# Patient Record
Sex: Female | Born: 1940 | Race: White | Hispanic: No | Smoking: Never smoker
Health system: Southern US, Community
[De-identification: ages and names within clinical notes are randomized; demographics above are authoritative.]

## PROBLEM LIST (undated history)

## (undated) DIAGNOSIS — R011 Cardiac murmur, unspecified: Secondary | ICD-10-CM

## (undated) DIAGNOSIS — J189 Pneumonia, unspecified organism: Secondary | ICD-10-CM

## (undated) DIAGNOSIS — Z803 Family history of malignant neoplasm of breast: Secondary | ICD-10-CM

## (undated) DIAGNOSIS — H269 Unspecified cataract: Secondary | ICD-10-CM

## (undated) DIAGNOSIS — R0600 Dyspnea, unspecified: Secondary | ICD-10-CM

## (undated) DIAGNOSIS — I10 Essential (primary) hypertension: Secondary | ICD-10-CM

## (undated) DIAGNOSIS — C801 Malignant (primary) neoplasm, unspecified: Secondary | ICD-10-CM

## (undated) DIAGNOSIS — Z800 Family history of malignant neoplasm of digestive organs: Secondary | ICD-10-CM

## (undated) DIAGNOSIS — M810 Age-related osteoporosis without current pathological fracture: Secondary | ICD-10-CM

## (undated) DIAGNOSIS — M5481 Occipital neuralgia: Secondary | ICD-10-CM

## (undated) DIAGNOSIS — Z8489 Family history of other specified conditions: Secondary | ICD-10-CM

## (undated) DIAGNOSIS — D649 Anemia, unspecified: Secondary | ICD-10-CM

## (undated) DIAGNOSIS — Z8042 Family history of malignant neoplasm of prostate: Secondary | ICD-10-CM

## (undated) DIAGNOSIS — M1990 Unspecified osteoarthritis, unspecified site: Secondary | ICD-10-CM

## (undated) DIAGNOSIS — Z860100 Personal history of colon polyps, unspecified: Secondary | ICD-10-CM

## (undated) HISTORY — PX: BREAST LUMPECTOMY: SHX2

## (undated) HISTORY — DX: Personal history of colonic polyps: Z86.010

## (undated) HISTORY — DX: Anemia, unspecified: D64.9

## (undated) HISTORY — PX: FRACTURE SURGERY: SHX138

## (undated) HISTORY — PX: EYE SURGERY: SHX253

## (undated) HISTORY — DX: Occipital neuralgia: M54.81

## (undated) HISTORY — DX: Cardiac murmur, unspecified: R01.1

## (undated) HISTORY — DX: Unspecified cataract: H26.9

## (undated) HISTORY — PX: WISDOM TOOTH EXTRACTION: SHX21

## (undated) HISTORY — PX: FOOT SURGERY: SHX648

## (undated) HISTORY — PX: TUBAL LIGATION: SHX77

## (undated) HISTORY — DX: Family history of malignant neoplasm of breast: Z80.3

## (undated) HISTORY — DX: Personal history of colon polyps, unspecified: Z86.0100

## (undated) HISTORY — DX: Family history of malignant neoplasm of prostate: Z80.42

## (undated) HISTORY — PX: COLON SURGERY: SHX602

## (undated) HISTORY — DX: Malignant (primary) neoplasm, unspecified: C80.1

## (undated) HISTORY — DX: Unspecified osteoarthritis, unspecified site: M19.90

## (undated) HISTORY — DX: Family history of malignant neoplasm of digestive organs: Z80.0

---

## 1999-04-07 ENCOUNTER — Ambulatory Visit (HOSPITAL_COMMUNITY): Admission: RE | Admit: 1999-04-07 | Discharge: 1999-04-07 | Payer: Self-pay | Admitting: Oral & Maxillofacial Surgery

## 1999-04-07 ENCOUNTER — Encounter: Payer: Self-pay | Admitting: Oral & Maxillofacial Surgery

## 1999-07-05 ENCOUNTER — Ambulatory Visit (HOSPITAL_COMMUNITY): Admission: RE | Admit: 1999-07-05 | Discharge: 1999-07-05 | Payer: Self-pay | Admitting: Gastroenterology

## 1999-07-05 ENCOUNTER — Encounter: Payer: Self-pay | Admitting: Gastroenterology

## 1999-12-09 ENCOUNTER — Encounter: Admission: RE | Admit: 1999-12-09 | Discharge: 1999-12-09 | Payer: Self-pay | Admitting: Family Medicine

## 1999-12-09 ENCOUNTER — Encounter: Payer: Self-pay | Admitting: Family Medicine

## 2000-02-16 ENCOUNTER — Other Ambulatory Visit: Admission: RE | Admit: 2000-02-16 | Discharge: 2000-02-16 | Payer: Self-pay | Admitting: Family Medicine

## 2000-03-13 ENCOUNTER — Ambulatory Visit (HOSPITAL_COMMUNITY): Admission: RE | Admit: 2000-03-13 | Discharge: 2000-03-13 | Payer: Self-pay | Admitting: Gastroenterology

## 2000-03-13 ENCOUNTER — Encounter (INDEPENDENT_AMBULATORY_CARE_PROVIDER_SITE_OTHER): Payer: Self-pay | Admitting: Specialist

## 2001-02-18 ENCOUNTER — Other Ambulatory Visit: Admission: RE | Admit: 2001-02-18 | Discharge: 2001-02-18 | Payer: Self-pay | Admitting: Family Medicine

## 2002-02-03 ENCOUNTER — Ambulatory Visit (HOSPITAL_COMMUNITY): Admission: RE | Admit: 2002-02-03 | Discharge: 2002-02-03 | Payer: Self-pay | Admitting: Gastroenterology

## 2002-02-03 ENCOUNTER — Encounter: Payer: Self-pay | Admitting: Gastroenterology

## 2002-02-19 ENCOUNTER — Other Ambulatory Visit: Admission: RE | Admit: 2002-02-19 | Discharge: 2002-02-19 | Payer: Self-pay | Admitting: Family Medicine

## 2003-04-20 ENCOUNTER — Other Ambulatory Visit: Admission: RE | Admit: 2003-04-20 | Discharge: 2003-04-20 | Payer: Self-pay | Admitting: Family Medicine

## 2003-05-21 ENCOUNTER — Encounter: Payer: Self-pay | Admitting: Family Medicine

## 2003-05-21 ENCOUNTER — Encounter: Admission: RE | Admit: 2003-05-21 | Discharge: 2003-05-21 | Payer: Self-pay | Admitting: Family Medicine

## 2004-06-08 ENCOUNTER — Other Ambulatory Visit: Admission: RE | Admit: 2004-06-08 | Discharge: 2004-06-08 | Payer: Self-pay | Admitting: Family Medicine

## 2004-08-16 ENCOUNTER — Encounter (HOSPITAL_COMMUNITY): Admission: RE | Admit: 2004-08-16 | Discharge: 2004-11-14 | Payer: Self-pay | Admitting: General Surgery

## 2004-08-18 ENCOUNTER — Ambulatory Visit (HOSPITAL_BASED_OUTPATIENT_CLINIC_OR_DEPARTMENT_OTHER): Admission: RE | Admit: 2004-08-18 | Discharge: 2004-08-18 | Payer: Self-pay | Admitting: General Surgery

## 2004-08-18 ENCOUNTER — Encounter (INDEPENDENT_AMBULATORY_CARE_PROVIDER_SITE_OTHER): Payer: Self-pay | Admitting: Specialist

## 2004-08-18 ENCOUNTER — Encounter: Admission: RE | Admit: 2004-08-18 | Discharge: 2004-08-18 | Payer: Self-pay | Admitting: General Surgery

## 2004-08-18 ENCOUNTER — Ambulatory Visit (HOSPITAL_COMMUNITY): Admission: RE | Admit: 2004-08-18 | Discharge: 2004-08-18 | Payer: Self-pay | Admitting: General Surgery

## 2004-08-30 ENCOUNTER — Ambulatory Visit: Payer: Self-pay | Admitting: Oncology

## 2004-09-06 ENCOUNTER — Ambulatory Visit: Admission: RE | Admit: 2004-09-06 | Discharge: 2004-09-08 | Payer: Self-pay | Admitting: Radiation Oncology

## 2005-02-07 ENCOUNTER — Ambulatory Visit: Payer: Self-pay | Admitting: Oncology

## 2005-06-20 ENCOUNTER — Other Ambulatory Visit: Admission: RE | Admit: 2005-06-20 | Discharge: 2005-06-20 | Payer: Self-pay | Admitting: Family Medicine

## 2005-08-09 ENCOUNTER — Ambulatory Visit (HOSPITAL_COMMUNITY): Admission: RE | Admit: 2005-08-09 | Discharge: 2005-08-09 | Payer: Self-pay | Admitting: Gastroenterology

## 2005-08-09 ENCOUNTER — Encounter (INDEPENDENT_AMBULATORY_CARE_PROVIDER_SITE_OTHER): Payer: Self-pay | Admitting: Specialist

## 2005-10-12 ENCOUNTER — Ambulatory Visit (HOSPITAL_BASED_OUTPATIENT_CLINIC_OR_DEPARTMENT_OTHER): Admission: RE | Admit: 2005-10-12 | Discharge: 2005-10-12 | Payer: Self-pay | Admitting: Orthopedic Surgery

## 2006-02-02 ENCOUNTER — Ambulatory Visit: Payer: Self-pay | Admitting: Oncology

## 2007-02-01 ENCOUNTER — Ambulatory Visit: Payer: Self-pay | Admitting: Oncology

## 2008-02-07 ENCOUNTER — Ambulatory Visit: Payer: Self-pay | Admitting: Oncology

## 2008-02-11 LAB — COMPREHENSIVE METABOLIC PANEL
ALT: 19 U/L (ref 0–35)
AST: 22 U/L (ref 0–37)
Alkaline Phosphatase: 71 U/L (ref 39–117)
Creatinine, Ser: 0.72 mg/dL (ref 0.40–1.20)
Sodium: 139 mEq/L (ref 135–145)
Total Bilirubin: 0.3 mg/dL (ref 0.3–1.2)
Total Protein: 6.5 g/dL (ref 6.0–8.3)

## 2008-02-11 LAB — CBC WITH DIFFERENTIAL/PLATELET
BASO%: 0.8 % (ref 0.0–2.0)
EOS%: 2.3 % (ref 0.0–7.0)
HCT: 40.9 % (ref 34.8–46.6)
LYMPH%: 33.3 % (ref 14.0–48.0)
MCH: 31.3 pg (ref 26.0–34.0)
MCHC: 35 g/dL (ref 32.0–36.0)
MONO#: 0.4 10*3/uL (ref 0.1–0.9)
NEUT%: 55.6 % (ref 39.6–76.8)
Platelets: ADEQUATE 10*3/uL (ref 145–400)
RBC: 4.56 10*6/uL (ref 3.70–5.32)
WBC: 5.1 10*3/uL (ref 3.9–10.0)

## 2008-10-31 ENCOUNTER — Inpatient Hospital Stay (HOSPITAL_COMMUNITY): Admission: AD | Admit: 2008-10-31 | Discharge: 2008-10-31 | Payer: Self-pay | Admitting: Family Medicine

## 2009-02-08 ENCOUNTER — Ambulatory Visit: Payer: Self-pay | Admitting: Oncology

## 2009-02-10 LAB — CBC WITH DIFFERENTIAL/PLATELET
BASO%: 0.3 % (ref 0.0–2.0)
Eosinophils Absolute: 0.1 10*3/uL (ref 0.0–0.5)
LYMPH%: 29.3 % (ref 14.0–49.7)
MCHC: 35 g/dL (ref 31.5–36.0)
MONO#: 0.4 10*3/uL (ref 0.1–0.9)
NEUT#: 2.7 10*3/uL (ref 1.5–6.5)
RBC: 4.05 10*6/uL (ref 3.70–5.45)
RDW: 12.5 % (ref 11.2–14.5)
WBC: 4.4 10*3/uL (ref 3.9–10.3)

## 2009-02-10 LAB — COMPREHENSIVE METABOLIC PANEL
ALT: 18 U/L (ref 0–35)
Albumin: 4.6 g/dL (ref 3.5–5.2)
Alkaline Phosphatase: 85 U/L (ref 39–117)
CO2: 24 mEq/L (ref 19–32)
Glucose, Bld: 118 mg/dL — ABNORMAL HIGH (ref 70–99)
Potassium: 4.1 mEq/L (ref 3.5–5.3)
Sodium: 139 mEq/L (ref 135–145)
Total Bilirubin: 0.3 mg/dL (ref 0.3–1.2)
Total Protein: 6.9 g/dL (ref 6.0–8.3)

## 2009-02-10 LAB — CANCER ANTIGEN 27.29: CA 27.29: 32 U/mL (ref 0–39)

## 2010-10-25 DIAGNOSIS — M159 Polyosteoarthritis, unspecified: Secondary | ICD-10-CM | POA: Insufficient documentation

## 2010-10-25 DIAGNOSIS — M810 Age-related osteoporosis without current pathological fracture: Secondary | ICD-10-CM | POA: Insufficient documentation

## 2011-01-16 LAB — URINE MICROSCOPIC-ADD ON

## 2011-01-16 LAB — URINALYSIS, ROUTINE W REFLEX MICROSCOPIC
Glucose, UA: NEGATIVE mg/dL
Protein, ur: >300 mg/dL — AB
Specific Gravity, Urine: >1.03 — ABNORMAL HIGH (ref 1.005–1.030)
pH: 5.5 (ref 5.0–8.0)

## 2011-01-20 DIAGNOSIS — I10 Essential (primary) hypertension: Secondary | ICD-10-CM | POA: Insufficient documentation

## 2011-02-17 NOTE — Procedures (Signed)
[facility]  Patient:    TACHA, MANNI                    MRN: [id number] Proc. Date: 03/13/00 Adm. Date:  [id number] Disc. Date: [id number] Attending:  Nelda Marseille CC:         Petra Kuba, M.D.             Talmadge Coventry, M.D.                           Procedure Report  PROCEDURE:  Colonoscopy.  ENDOSCOPIST:  Petra Kuba, M.D.  INDICATION:  Abnormal BE.  Consent was signed after risks, benefits, methods and options were thoroughly discussed multiple times in the past.  MEDICINES USED:  Demerol 80 mg, Versed 8 mg.  DESCRIPTION OF PROCEDURE:  Rectal inspection was pertinent for external hemorrhoids.  Digital exam was negative.  The pediatric videocolonoscope was inserted, easily advanced to about 50 cm.  Unfortunately, at that junction, scope began to loop and despite multiple position changes and abdominal pressures, we were unable to keep from looping and we elected to slowly withdraw.  No abnormalities were seen on withdrawal.  Once back in the rectum, the scope was retroflexed.  We then went ahead and inserted the videocolonoscope, which we were easily able to advance around the colon to the cecum.  This did require some left lower quadrant pressure.  There was some minimal looping but this did not require position changes.  On insertion, no abnormalities were seen.  The cecum was identified by the appendiceal orifice and the ileocecal valve; in fact, the scope was inserted a short ways into the terminal ileum which was normal.  Photo-documentation was obtained.  The scope was slowly withdrawn.  On slow withdrawal through the colon, a questionable 2- or 3-mm polyp was seen in the approximately level of the transverse.  Due to significant looping, it was sometimes hard to say what location you were at, transverse or be it descending.  When we fell back around a loop or a curve, we did try to readvance around that curve.  A few  cold biopsies if this questionable polyp were seen.  Once back in the rectum, the scope was retroflexed.  We went ahead and readvanced to the cecum with her on her left side and then rolled her on her right side and on slow withdrawal with her now on the right side, another 1- to 2-mm polyp was seen in the possible transverse, which was cold-biopsied as well, but again, no obvious significant polyp was seen as we slowly withdrew back to the rectum with her on her right side.  Patient was then returned to her left side, scope advanced to 30 cm, air was suctioned, scope removed.  Patient tolerated the procedure adequately. There was no obvious immediate complication.  ENDOSCOPIC DIAGNOSES: 1. Internal and external hemorrhoids. 2. Tortuous colon. 3. Two questionable polyps in the transverse, both small, status post cold    biopsy. 4. Otherwise within normal limits to the terminal ileum, with a good look on    both the left and the right side.  PLAN:  We will see her back in six weeks to recheck guaiac, symptoms, CBC and decide if an upper GI small-bowel follow-through is needed.  We will await her pathology to help determine future colonic screening but will probably need a barium enema at some point in  the future with a Fleet prep to help confirm that that polyp is no longer there and was not missed due to her tortuous colon. DD:  03/13/00 TD:  03/16/00 Job: [id number]

## 2011-02-17 NOTE — Op Note (Signed)
NAME:  Gloria Cummings, Gloria Cummings             ACCOUNT NO.:  [id number]   MEDICAL RECORD NO.:  [id number]          PATIENT TYPE:  AMB   LOCATION:  DSC                          FACILITY:  [facility]   PHYSICIAN:  Katy Fitch. Sypher, M.D. DATE OF BIRTH:  December 04, 1940   DATE OF PROCEDURE:  10/12/2005  DATE OF DISCHARGE:                                 OPERATIVE REPORT   PREOP DIAGNOSIS:  1.  Chronic stenosing tenosynovitis, right first dorsal compartment.  2.  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   POSTOP DIAGNOSIS:  1.  Chronic stenosing tenosynovitis, right first dorsal compartment.  2.  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   OPERATION:  1.  Release of right first dorsal compartment with resection of septum      between extensor pollicis brevis and abductor pollicis longus tendons.  2.  Release of left long finger A1 pulley.   OPERATING SURGEON:  Molly Maduro Sypher   ASSISTANT:  Molly Maduro Dasnoit PA-C.   ANESTHESIA:  0.25% Marcaine and 2% lidocaine metacarpal head level block,  left long finger and field block of the right first loss compartment region.   SUPERVISING ANESTHESIOLOGIST:  Dr. Gypsy Balsam   INDICATIONS:  Gloria Cummings is a 70 year old woman referred by Dr.  Harvie Heck for evaluation and management of bilateral hand pain  predicaments. Clinical examination revealed signs of chronic right first  dorsal compartment stenosing tenosynovitis and examination of the left hand  revealed chronic triggering of the left long finger.   Due to failure to respond to nonoperative measures, she is brought to the  operating room at this time for release of her right first dorsal  compartment and release of her left long finger A1 pulley.   PROCEDURE:  Gloria Cummings was brought to the operating room and placed in  supine position on the table.   Following light sedation, the right and left arms were prepped with  Hibiclens due to a  Betadine intolerance.   A pneumatic tourniquet was applied to the proximal right brachium. IV access  was obtained at the left antecubital fossa.   The right and left hands were sterilely draped. The right arm exsanguinated  with Esmarch bandage and the arterial tourniquet on the proximal right  brachium inflated to 250 mmHg due to mild systolic hypertension.   Procedure commenced with a short transverse incision directly over the  palpably thickened first dorsal compartment.   The subcutaneous tissues were carefully divided taking care to identify the  multiple radial sensory branches and gently elevate them off of the swollen  compartment.   The compartment was very thickened, opaque and had a small ganglion cyst  developing at its apex.   The compartment incised and found to wall diameter of more than 4 mm. Two  slips of the abductor pollicis longus were noted. The septum separated the  extensor pollicis brevis. This was resected.   After release the compartment, free range of motion of the thumb CMC and MP  joints was recovered as well as free wrist motion.   The wound was repaired  with intradermal 3-0 Prolene suture and a Steri-  Strip. A compressive dressing was applied with sterile gauze and an Ace  wrap. The tourniquet was released with immediate capillary refill to the  thumb and fingers. Attention then directed to the left hand. The hand and  distal forearm were exsanguinated with an Esmarch bandage which was used on  the mid forearm as tourniquet. The swollen A1 pulley of the long finger was  easily identified. A short transverse incision was fashioned directly over  the proximal margin of the A1 pulley. The subcutaneous tissues were  carefully taking care to identify and gently retract the neurovascular  bundles. The pulley was split with scalpel and scissors along its radial  border. The flexor tendons were delivered found be swollen distal to the  pulley but  otherwise normal in appearance. Thereafter free range of motion  of the long finger was recovered.   Gloria Cummings tolerated surgery and anesthesia well. She was transferred to  the recovery room with stable vital signs.      Katy Fitch Sypher, M.D.  Electronically Signed     RVS/MEDQ  D:  10/12/2005  T:  10/13/2005  Job:  [id number]   cc:   Gloria Cummings, M.D.  Fax: [phone]

## 2011-11-11 ENCOUNTER — Ambulatory Visit (INDEPENDENT_AMBULATORY_CARE_PROVIDER_SITE_OTHER): Payer: Medicare Other | Admitting: Internal Medicine

## 2011-11-11 ENCOUNTER — Ambulatory Visit: Payer: Medicare Other

## 2011-11-11 VITALS — BP 123/72 | HR 80 | Temp 98.1°F | Resp 16 | Ht 65.0 in | Wt 134.2 lb

## 2011-11-11 DIAGNOSIS — J189 Pneumonia, unspecified organism: Secondary | ICD-10-CM

## 2011-11-11 DIAGNOSIS — I10 Essential (primary) hypertension: Secondary | ICD-10-CM

## 2011-11-11 DIAGNOSIS — J159 Unspecified bacterial pneumonia: Secondary | ICD-10-CM

## 2011-11-11 MED ORDER — AZITHROMYCIN 500 MG PO TABS: 500.0000 mg | ORAL_TABLET | Freq: Every day | ORAL | Status: AC

## 2011-11-11 MED ORDER — HYDROCOD POLST-CHLORPHEN POLST 10-8 MG/5ML PO LQCR: 5.0000 mL | Freq: Two times a day (BID) | ORAL | Status: DC | PRN

## 2011-11-11 NOTE — Progress Notes (Signed)
  Subjective:    Patient ID: Gloria Cummings, female    DOB: 07-13-1941, 71 y.o.   MRN: [id number]  HPI  This 71 year old female has a four-day history of a productive cough with marked fatigue. Cough is productive. She hears rattling at night and feels short of breath. There is no history of prior pulmonary problems except for a pneumonia in [address]. She is healthy except for hypertension is on Micardis. 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 She is active in contradance This illness has made her cut back on her activities greatly. The cough is so severe that she can't sleep at night.   Review of Systemsreview of systems is otherwise not helpful.she describes some otalgia on the right. There is a mild sore throat. There is no nausea and vomiting. There is no fever or night sweats.     Objective:   Physical ExamHEENT-conjunctiva are clear, tympanic membranes are intact and canals are clear, the throat is clear and there is no associated anterior cervical lymphadenopathy. The lungs reveal rales in the right lower lobe with rhonchi posteriorly. These do not clear with cough. Breath sounds are other side otherwise full throughout. Heart the heart is regular without murmurs rubs or gallops.  [facility] reading (PRIMARY) by  Dr.Lakeyta Vandenheuvel= There increased markings in the right lower lobe with some attenuation of the diaphragm border..     Assessment & Plan:  Problem #1 community-acquired pneumonia X-ray will be reviewed by radiology Treatment withZithromax 500 daily for 5 days and Tussionex suspension Followup quickly if not responding to treatment

## 2013-01-14 DIAGNOSIS — N390 Urinary tract infection, site not specified: Secondary | ICD-10-CM | POA: Insufficient documentation

## 2013-01-14 DIAGNOSIS — K635 Polyp of colon: Secondary | ICD-10-CM | POA: Insufficient documentation

## 2013-01-14 DIAGNOSIS — B0089 Other herpesviral infection: Secondary | ICD-10-CM | POA: Insufficient documentation

## 2015-02-23 ENCOUNTER — Other Ambulatory Visit (HOSPITAL_COMMUNITY): Payer: Self-pay | Admitting: Family Medicine

## 2015-02-23 ENCOUNTER — Telehealth (HOSPITAL_COMMUNITY): Payer: Self-pay | Admitting: *Deleted

## 2015-02-23 DIAGNOSIS — R011 Cardiac murmur, unspecified: Secondary | ICD-10-CM

## 2015-02-25 ENCOUNTER — Ambulatory Visit (HOSPITAL_COMMUNITY)
Admission: RE | Admit: 2015-02-25 | Discharge: 2015-02-25 | Disposition: A | Discharge status: Home / Self Care | Payer: Medicare Other | Source: Ambulatory Visit | Attending: Cardiology | Admitting: Cardiology

## 2015-02-25 DIAGNOSIS — R011 Cardiac murmur, unspecified: Secondary | ICD-10-CM | POA: Diagnosis not present

## 2015-02-25 DIAGNOSIS — I700 Atherosclerosis of aorta: Secondary | ICD-10-CM | POA: Insufficient documentation

## 2015-05-12 ENCOUNTER — Other Ambulatory Visit: Payer: Self-pay | Admitting: Gastroenterology

## 2015-08-26 ENCOUNTER — Emergency Department (HOSPITAL_COMMUNITY)
Admission: EM | Admit: 2015-08-26 | Discharge: 2015-08-26 | Disposition: A | Discharge status: Home / Self Care | Payer: Medicare Other | Attending: Emergency Medicine | Admitting: Emergency Medicine

## 2015-08-26 ENCOUNTER — Emergency Department (HOSPITAL_COMMUNITY): Payer: Medicare Other

## 2015-08-26 ENCOUNTER — Encounter (HOSPITAL_COMMUNITY): Payer: Self-pay | Admitting: *Deleted

## 2015-08-26 DIAGNOSIS — Y998 Other external cause status: Secondary | ICD-10-CM | POA: Insufficient documentation

## 2015-08-26 DIAGNOSIS — Z7982 Long term (current) use of aspirin: Secondary | ICD-10-CM | POA: Insufficient documentation

## 2015-08-26 DIAGNOSIS — I10 Essential (primary) hypertension: Secondary | ICD-10-CM | POA: Diagnosis not present

## 2015-08-26 DIAGNOSIS — Z8739 Personal history of other diseases of the musculoskeletal system and connective tissue: Secondary | ICD-10-CM | POA: Insufficient documentation

## 2015-08-26 DIAGNOSIS — W1839XA Other fall on same level, initial encounter: Secondary | ICD-10-CM | POA: Insufficient documentation

## 2015-08-26 DIAGNOSIS — S8992XA Unspecified injury of left lower leg, initial encounter: Secondary | ICD-10-CM | POA: Diagnosis present

## 2015-08-26 DIAGNOSIS — Y9289 Other specified places as the place of occurrence of the external cause: Secondary | ICD-10-CM | POA: Diagnosis not present

## 2015-08-26 DIAGNOSIS — Y9301 Activity, walking, marching and hiking: Secondary | ICD-10-CM | POA: Diagnosis not present

## 2015-08-26 DIAGNOSIS — Z79899 Other long term (current) drug therapy: Secondary | ICD-10-CM | POA: Insufficient documentation

## 2015-08-26 DIAGNOSIS — S8392XA Sprain of unspecified site of left knee, initial encounter: Secondary | ICD-10-CM | POA: Diagnosis not present

## 2015-08-26 HISTORY — DX: Age-related osteoporosis without current pathological fracture: M81.0

## 2015-08-26 HISTORY — DX: Essential (primary) hypertension: I10

## 2015-08-26 MED ORDER — HYDROCODONE-ACETAMINOPHEN 5-325 MG PO TABS: 2.0000 | ORAL_TABLET | ORAL | Status: DC | PRN

## 2015-08-26 MED ORDER — IBUPROFEN 600 MG PO TABS: 600.0000 mg | ORAL_TABLET | Freq: Three times a day (TID) | ORAL | Status: DC

## 2015-08-26 NOTE — ED Notes (Signed)
Pt states that she fell over her grandsons toy and landed her left knee on the concrete floor. Denies pain but states that she is afraid to put weight on it.

## 2015-08-26 NOTE — Discharge Instructions (Signed)
Knee Ligament Injury, Arthroscopy Arthroscopy is a surgical technique in which your health care provider examines your knee through a small, pencil-sized telescope (arthroscope). Often, repairs to injured ligaments can be done with instruments in the arthroscope. Arthroscopy is less invasive than open-knee surgery.  LET [facility] CARE PROVIDER KNOW ABOUT:  Any allergies you have.  All medicines you are taking, including vitamins, herbs, eye drops, creams, and over-the-counter medicines.  Previous problems you or members of your family have had with the use of anesthetics.  Any blood disorders you have.  Previous surgeries you have had.  Medical conditions you have. RISKS AND COMPLICATIONS  Generally, this is a safe procedure. However, as with any procedure, problems can occur. Possible problems include:  Infection.  Bleeding.  Stiffness. BEFORE THE PROCEDURE  Ask your health care provider about changing or stopping any regular medicines. Avoid taking aspirin or blood thinners as directed by your health care provider.   Do not eat or drink anything after midnight the night before surgery.   If you smoke, do not smoke for at least 2 weeks before your surgery.   Do not drink alcohol starting the day before your surgery.   Let your health care provider know if you develop a cold or any infection before your surgery.   Arrange for someone to drive you home after the surgery or after your hospital stay. Also arrange for someone to help you with activities during recovery.  PROCEDURE  Small monitors will be put on your body. They are used to check your heart, blood pressure, and oxygen levels.   An IV access tube will be put into one of your veins. Medicine will be able to flow directly into your body through this IV tube.   You might be given a medicine to help you relax (sedative).   You will be given a medicine that makes you go to sleep (general anesthetic), and a  breathing tube will be placed into your lungs during the procedure.  Several small incisions are made in your knee. Saline fluid is placed into one of the incisions to expand the knee and clear away any blood in the knee.  Your health care provider will insert the arthroscope to examine the injured knee.  During arthroscopy, your health care provider may find a partial or complete tear in a ligament.  Tools can be inserted through the other incisions to repair the injured ligaments.  The incisions are then closed with absorbable stitches and covered with dressings. AFTER THE PROCEDURE  You will be taken to the recovery area where you will be monitored.  When you are awake, stable, and taking fluids without problems, you will be allowed to go home.   This information is not intended to replace advice given to you by your health care provider. Make sure you discuss any questions you have with your health care provider.   Document Released: 09/15/2000 Document Revised: 09/23/2013 Document Reviewed: 04/30/2013 Elsevier Interactive Patient Education 2016 [employer].  How to Use a Knee Brace A knee brace is a device that you wear to support your knee, especially if the knee is healing after an injury or surgery. There are several types of knee braces. Some are designed to prevent an injury (prophylactic brace). These are often worn during sports. Others support an injured knee (functional brace) or keep it still while it heals (rehabilitative brace). People with severe arthritis of the knee may benefit from a brace that takes some  pressure off the knee (unloader brace). Most knee braces are made from a combination of cloth and metal or plastic.  You may need to wear a knee brace to:  Relieve knee pain.  Help your knee support your weight (improve stability).  Help you walk farther (improve mobility).  Prevent injury.  Support your knee while it heals from surgery or from an  injury. RISKS AND COMPLICATIONS Generally, knee braces are very safe to wear. However, problems may occur, including:  Skin irritation that may lead to infection.  Making your condition worse if you wear the brace in the wrong way. HOW TO USE A KNEE BRACE Different braces will have different instructions for use. Your health care provider will tell you or show you:  How to put on your brace.  How to adjust the brace.  When and how often to wear the brace.  How to remove the brace.  If you will need any assistive devices in addition to the brace, such as crutches or a cane. In general, your brace should:  Have the hinge of the brace line up with the bend of your knee.  Have straps, hooks, or tapes that fasten snugly around your leg.  Not feel too tight or too loose. HOW TO CARE FOR A KNEE BRACE  Check your brace often for signs of damage, such as loose connections or attachments. Your knee brace may get damaged or wear out during normal use.  Wash the fabric parts of your brace with soap and water.  Read the insert that comes with your brace for other specific care instructions. SEEK MEDICAL CARE IF:  Your knee brace is too loose or too tight and you cannot adjust it.  Your knee brace causes skin redness, swelling, bruising, or irritation.  Your knee brace is not helping.  Your knee brace is making your knee pain worse.   This information is not intended to replace advice given to you by your health care provider. Make sure you discuss any questions you have with your health care provider.   Document Released: 12/09/2003 Document Revised: 06/09/2015 Document Reviewed: 01/11/2015 Elsevier Interactive Patient Education 2016 [employer].  Cryotherapy Cryotherapy means treatment with cold. Ice or gel packs can be used to reduce both pain and swelling. Ice is the most helpful within the first 24 to 48 hours after an injury or flare-up from overusing a muscle or joint.  Sprains, strains, spasms, burning pain, shooting pain, and aches can all be eased with ice. Ice can also be used when recovering from surgery. Ice is effective, has very few side effects, and is safe for most people to use. PRECAUTIONS  Ice is not a safe treatment option for people with:  Raynaud phenomenon. This is a condition affecting small blood vessels in the extremities. Exposure to cold may cause your problems to return.  Cold hypersensitivity. There are many forms of cold hypersensitivity, including:  Cold urticaria. Red, itchy hives appear on the skin when the tissues begin to warm after being iced.  Cold erythema. This is a red, itchy rash caused by exposure to cold.  Cold hemoglobinuria. Red blood cells break down when the tissues begin to warm after being iced. The hemoglobin that carry oxygen are passed into the urine because they cannot combine with blood proteins fast enough.  Numbness or altered sensitivity in the area being iced. If you have any of the following conditions, do not use ice until you have discussed cryotherapy with your  caregiver:  Heart conditions, such as arrhythmia, angina, or chronic heart disease.  High blood pressure.  Healing wounds or open skin in the area being iced.  Current infections.  Rheumatoid arthritis.  Poor circulation.  Diabetes. Ice slows the blood flow in the region it is applied. This is beneficial when trying to stop inflamed tissues from spreading irritating chemicals to surrounding tissues. However, if you expose your skin to cold temperatures for too long or without the proper protection, you can damage your skin or nerves. Watch for signs of skin damage due to cold. HOME CARE INSTRUCTIONS Follow these tips to use ice and cold packs safely.  Place a dry or damp towel between the ice and skin. A damp towel will cool the skin more quickly, so you may need to shorten the time that the ice is used.  For a more rapid response,  add gentle compression to the ice.  Ice for no more than 10 to 20 minutes at a time. The bonier the area you are icing, the less time it will take to get the benefits of ice.  Check your skin after 5 minutes to make sure there are no signs of a poor response to cold or skin damage.  Rest 20 minutes or more between uses.  Once your skin is numb, you can end your treatment. You can test numbness by very lightly touching your skin. The touch should be so light that you do not see the skin dimple from the pressure of your fingertip. When using ice, most people will feel these normal sensations in this order: cold, burning, aching, and numbness.  Do not use ice on someone who cannot communicate their responses to pain, such as small children or people with dementia. HOW TO MAKE AN ICE PACK Ice packs are the most common way to use ice therapy. Other methods include ice massage, ice baths, and cryosprays. Muscle creams that cause a cold, tingly feeling do not offer the same benefits that ice offers and should not be used as a substitute unless recommended by your caregiver. To make an ice pack, do one of the following:  Place crushed ice or a bag of frozen vegetables in a sealable plastic bag. Squeeze out the excess air. Place this bag inside another plastic bag. Slide the bag into a pillowcase or place a damp towel between your skin and the bag.  Mix 3 parts water with 1 part rubbing alcohol. Freeze the mixture in a sealable plastic bag. When you remove the mixture from the freezer, it will be slushy. Squeeze out the excess air. Place this bag inside another plastic bag. Slide the bag into a pillowcase or place a damp towel between your skin and the bag. SEEK MEDICAL CARE IF:  You develop white spots on your skin. This may give the skin a blotchy (mottled) appearance.  Your skin turns blue or pale.  Your skin becomes waxy or hard.  Your swelling gets worse. MAKE SURE YOU:   Understand these  instructions.  Will watch your condition.  Will get help right away if you are not doing well or get worse.   This information is not intended to replace advice given to you by your health care provider. Make sure you discuss any questions you have with your health care provider.   Document Released: 05/15/2011 Document Revised: 10/09/2014 Document Reviewed: 05/15/2011 Elsevier Interactive Patient Education 2016 [employer]  Knee Sprain A knee sprain is a tear in one  of the strong, fibrous tissues that connect the bones (ligaments) in your knee. The severity of the sprain depends on how much of the ligament is torn. The tear can be either partial or complete. CAUSES  Often, sprains are a result of a fall or injury. The force of the impact causes the fibers of your ligament to stretch too much. This excess tension causes the fibers of your ligament to tear. SIGNS AND SYMPTOMS  You may have some loss of motion in your knee. Other symptoms include:  Bruising.  Pain in the knee area.  Tenderness of the knee to the touch.  Swelling. DIAGNOSIS  To diagnose a knee sprain, your health care provider will physically examine your knee. Your health care provider may also suggest an X-ray exam of your knee to make sure no bones are broken. TREATMENT  If your ligament is only partially torn, treatment usually involves keeping the knee in a fixed position (immobilization) or bracing your knee for activities that require movement for several weeks. To do this, your health care provider will apply a bandage, cast, or splint to keep your knee from moving and to support your knee during movement until it heals. For a partially torn ligament, the healing process usually takes 4-6 weeks. If your ligament is completely torn, depending on which ligament it is, you may need surgery to reconnect the ligament to the bone or reconstruct it. After surgery, a cast or splint may be applied and will need to stay on  your knee for 4-6 weeks while your ligament heals. HOME CARE INSTRUCTIONS  Keep your injured knee elevated to decrease swelling.  To ease pain and swelling, apply ice to the injured area:  Put ice in a plastic bag.  Place a towel between your skin and the bag.  Leave the ice on for 20 minutes, 2-3 times a day.  Only take medicine for pain as directed by your health care provider.  Do not leave your knee unprotected until pain and stiffness go away (usually 4-6 weeks).  If you have a cast or splint, do not allow it to get wet. If you have been instructed not to remove it, cover it with a plastic bag when you shower or bathe. Do not swim.  Your health care provider may suggest exercises for you to do during your recovery to prevent or limit permanent weakness and stiffness. SEEK IMMEDIATE MEDICAL CARE IF:  Your cast or splint becomes damaged.  Your pain becomes worse.  You have significant pain, swelling, or numbness below the cast or splint. MAKE SURE YOU:  Understand these instructions.  Will watch your condition.  Will get help right away if you are not doing well or get worse.   This information is not intended to replace advice given to you by your health care provider. Make sure you discuss any questions you have with your health care provider.   Document Released: 09/18/2005 Document Revised: 10/09/2014 Document Reviewed: 04/30/2013 Elsevier Interactive Patient Education [employer].

## 2015-08-26 NOTE — ED Provider Notes (Signed)
CSN: [id number]     Arrival date & time 08/26/15  1741 History  By signing my name below, I, Gloria Cummings, attest that this documentation has been prepared under the direction and in the presence of Delsa Grana, PA-C Electronically Signed: Erling Cummings, ED Scribe. 08/26/2015. 6:48 PM.    Chief Complaint  Patient presents with  . Fall   The history is provided by the patient. No language interpreter was used.    HPI Comments: Gloria Cummings is a 74 y.o. female with a h/o osteoporosis and HTN who presents to the Emergency Department complaining of constant, mild, left knee pain onset 2 hours s/p fall from a standing position. She reports her 39 year old grandson had set up a trip wire in the hallway and she was walking down the hallway and did not see it and tripped and fell onto her left knee along with her outstretched hands to catch herself. Pt denies any popping or twisting motion in the fall. She has mild pain in her left knee, felt worse behind her knee and on the medial side, worse with weightbearing.  She denies any deformity, swelling or redness. She is able to fully extend and flex her knee with some discomfort and states she was able to bear weight but she is afraid to put weight on the knee and is ambulatory with a slightly limp. She has not had any medications prior to arrival. Pt denies any head injury or LOC in the fall. She denies any hip pain, back pain, hand pain.  She states when she first fell both of her palms were slightly red but that has resolved and she has no residual pain swelling or issues with motion or sensation in her hands, wrist and arm.  She denies any personal cardiac history although she has family risk factors and therefore takes a baby aspirin daily.  She does not have any kidney damage from her hypertension history, states that she regularly takes ibuprofen for muscle aches and pains. She will take a prophylactically when she goes dancing. She has no history  of stomach ulcers, GI bleed, intracranial bleed.  Past Medical History  Diagnosis Date  . Hypertension   . Osteoporosis    Past Surgical History  Procedure Laterality Date  . Foot surgery    . Tubal ligation    . Breast lumpectomy     No family history on file. Social History  Substance Use Topics  . Smoking status: Never Smoker   . Smokeless tobacco: None  . Alcohol Use: No   OB History    No data available     Review of Systems  Constitutional: Negative.   Musculoskeletal: Positive for arthralgias and gait problem. Negative for myalgias, back pain and joint swelling.  Skin: Negative for color change and wound.  Neurological: Negative.  Negative for weakness.  Hematological: Negative.       Allergies  Betadine and Lisinopril  Home Medications   Prior to Admission medications   Medication Sig Start Date End Date Taking? Authorizing Provider  aspirin 81 MG tablet Take 160 mg by mouth daily.    Historical Provider, MD  chlorpheniramine-HYDROcodone (TUSSIONEX PENNKINETIC ER) 10-8 MG/5ML LQCR Take 5 mLs by mouth every 12 (twelve) hours as needed. 11/11/11   Leandrew Koyanagi, MD  cholecalciferol (VITAMIN D) 1000 UNITS tablet Take 1,000 Units by mouth 2 (two) times daily.    Historical Provider, MD  dextromethorphan-guaiFENesin (MUCINEX DM) 30-600 MG per 12 hr tablet  Take 1 tablet by mouth every 12 (twelve) hours.    Historical Provider, MD  fish oil-omega-3 fatty acids 1000 MG capsule Take 2 g by mouth daily.    Historical Provider, MD  telmisartan (MICARDIS) 80 MG tablet Take 80 mg by mouth daily.    Historical Provider, MD   Triage Vitals: BP 141/80 mmHg  Pulse 73  Temp(Src) 98.8 F (37.1 C) (Oral)  Resp 18  Ht 5\' 3"  (1.6 m)  Wt 130 lb (58.968 kg)  BMI 23.03 kg/m2  SpO2 97%  Physical Exam  Constitutional: She is oriented to person, place, and time. She appears well-developed and well-nourished. No distress.  HENT:  Head: Normocephalic and atraumatic.  Nose:  Nose normal.  Eyes: Conjunctivae and EOM are normal. Pupils are equal, round, and reactive to light. Right eye exhibits no discharge. Left eye exhibits no discharge. No scleral icterus.  Neck: Normal range of motion. Neck supple. No tracheal deviation present.  Cardiovascular: Normal rate.   Pulmonary/Chest: Effort normal and breath sounds normal. No respiratory distress.  Musculoskeletal: Normal range of motion.       Left knee: She exhibits no effusion, no deformity, no LCL laxity, normal patellar mobility and no MCL laxity. Tenderness found. Medial joint line and MCL tenderness noted.  Left knee is normal in appearance without deformity, edema or bruising. TTP to medial joint line, MCL and popliteal fossa. No palpable effusion, no patellar instability. Negative anterior drawer. Pain with varus stress, no pain with valgus stress, no LCL or MCL laxity. Normal ROM with full flexion and full extension active and passive. Dorsal pedis and posterior tibialis pulses 2+ pulses, normal sensation to light touch.  Bilateral palms without erythema or edema, normal range of motion of bilateral fingers, hands and wrists  Neurological: She is alert and oriented to person, place, and time. She exhibits normal muscle tone. Coordination normal.  Antalgic gait  Skin: Skin is warm and dry. She is not diaphoretic. No erythema.  Psychiatric: She has a normal mood and affect. Her behavior is normal.  Nursing note and vitals reviewed.   ED Course  Procedures (including critical care time)  DIAGNOSTIC STUDIES: Oxygen Saturation is 97% on RA, normal by my interpretation.    COORDINATION OF CARE: 5:49- Will order imaging of left knee.  Pt advised of plan for treatment and pt agrees.  6:49 PM- encouraged RICE. Will order pt a knee sleeve and crutches.  Advised short term of 800mg  Ibuprofen as needed for pain up to 3x a day. Will provide pt with ortho referral. Pt advised of plan for treatment and pt  agrees.   Labs Review Labs Reviewed - No data to display  Imaging Review Dg Knee Complete 4 Views Left  08/26/2015  CLINICAL DATA:  Left knee pain after a fall today. EXAM: LEFT KNEE - COMPLETE 4+ VIEW COMPARISON:  None. FINDINGS: There is no evidence of fracture, dislocation, or joint effusion. There is no evidence of arthropathy or other focal bone abnormality. Soft tissues are unremarkable. IMPRESSION: Negative. Electronically Signed   By: Lucienne Capers M.D.   On: 08/26/2015 18:28   I have personally reviewed and evaluated these images as part of my medical decision-making.   EKG Interpretation None      MDM   Final diagnoses:  None  Patient with a trip and fall accident that occurred just prior to arrival. She landed on her left knee, fell from standing striking her left knee onto a tiled floor. She was  immediately able to bear weight with assistance.  She ambulated into the ER. Patient denies any head trauma or loss of consciousness.  Her knee contusion is without any obvious signs of injury, her x-ray was negative for fracture or dislocation.  She has normal range of motion and is neurovascularly intact. She does have tenderness to the MCL, medial joint line and popliteal fossa, suggestive of a MCL/meniscal injury. She has no instability, and will be placed in a knee sleeve and be given crutches. Rice therapy was reviewed with the patient at length, as well as the dosing and use of ibuprofen for inflammation, and follow-up with ortho.  Patient has no contraindications to NSAID use however she was slightly cautious about the dosing of the medication. It was reduced to 600 mg 3 times a day.  She is instructed to take it on a full stomach and discontinue use with any stomach irritation.  Patient denied pain medicine while in the ER.  She was fitted for crutches by the ER tech, reportedly did well.  She was discharged in satisfactory condition. Filed Vitals:   08/26/15 1747 08/26/15  1857  BP: 141/80 138/78  Pulse: 73 72  Temp: 98.8 F (37.1 C)   TempSrc: Oral   Resp: 18 20  Height: 5\' 3"  (1.6 m)   Weight: 58.968 kg   SpO2: 97% 100%    I personally performed the services described in this documentation, which was scribed in my presence. The recorded information has been reviewed and is accurate.      Delsa Grana, PA-C 08/26/15 [place], MD 08/26/15 [phone]

## 2016-01-31 ENCOUNTER — Ambulatory Visit (INDEPENDENT_AMBULATORY_CARE_PROVIDER_SITE_OTHER): Payer: Medicare Other | Admitting: Physician Assistant

## 2016-01-31 VITALS — BP 132/78 | HR 81 | Temp 98.5°F | Resp 17 | Ht 65.0 in | Wt 124.0 lb

## 2016-01-31 DIAGNOSIS — Z23 Encounter for immunization: Secondary | ICD-10-CM

## 2016-01-31 DIAGNOSIS — S61001A Unspecified open wound of right thumb without damage to nail, initial encounter: Secondary | ICD-10-CM | POA: Diagnosis not present

## 2016-01-31 DIAGNOSIS — S61209A Unspecified open wound of unspecified finger without damage to nail, initial encounter: Secondary | ICD-10-CM

## 2016-01-31 NOTE — Patient Instructions (Signed)
     IF you received an x-ray today, you will receive an invoice from [facility]. Please contact [facility] at [phone] with questions or concerns regarding your invoice.   IF you received labwork today, you will receive an invoice from [facility]. Please contact [facility] at [phone] with questions or concerns regarding your invoice.   Our billing staff will not be able to assist you with questions regarding bills from these companies.  You will be contacted with the lab results as soon as they are available. The fastest way to get your results is to activate your My Chart account. Instructions are located on the last page of this paperwork. If you have not heard from us regarding the results in 2 weeks, please contact this office.      

## 2016-02-02 NOTE — Progress Notes (Signed)
   Patient ID: Gloria Cummings, female    DOB: January 02, 1941, 75 y.o.   MRN: [id number]  PCP: Leamon Arnt, MD  Subjective:   Chief Complaint  Patient presents with  . Finger Injury    right thumb injury has been bleeding since Friday, unsure when last tdap was     HPI Presents for evaluation of an injury to the RIGHT thumb sustained 01/28/2016 when the catch on the gas pump snapped and pinched the thumb.  She immediately washed the wound with soap and water, applied a large dressing. The dressing had to be very tight and bulky, and the wound continued to bleed. She presents today to see if she needs stitches or if it may be infected.  No fever, chills. No swelling. No redness.     Review of Systems As above.    Patient Active Problem List   Diagnosis Date Noted  . HTN (hypertension) 11/11/2011     Prior to Admission medications   Medication Sig Start Date End Date Taking? Authorizing Provider  calcium carbonate (TUMS EX) 750 MG chewable tablet Chew by mouth.   Yes Historical Provider, MD  cholecalciferol (VITAMIN D) 1000 UNITS tablet Take 1,000 Units by mouth 2 (two) times daily.   Yes Historical Provider, MD  fish oil-omega-3 fatty acids 1000 MG capsule Take 2 g by mouth daily.   Yes Historical Provider, MD  ibuprofen (ADVIL,MOTRIN) 600 MG tablet Take 1 tablet (600 mg total) by mouth 3 (three) times daily. 08/26/15  Yes Delsa Grana, PA-C  telmisartan (MICARDIS) 80 MG tablet Take 80 mg by mouth daily.   Yes Historical Provider, MD  HYDROcodone-acetaminophen (NORCO/VICODIN) 5-325 MG tablet Take 2 tablets by mouth every 4 (four) hours as needed. Patient not taking: Reported on 01/31/2016 08/26/15   Delsa Grana, PA-C     Allergies  Allergen Reactions  . Betadine [Povidone Iodine] Swelling  . Lisinopril Cough       Objective:  Physical Exam  Constitutional: She is oriented to person, place, and time. She appears well-developed and well-nourished. She is active and  cooperative. No distress.  BP 132/78 mmHg  Pulse 81  Temp(Src) 98.5 F (36.9 C) (Oral)  Resp 17  Ht 5\' 5"  (1.651 m)  Wt 124 lb (56.246 kg)  BMI 20.63 kg/m2  SpO2 99%   Eyes: Conjunctivae are normal.  Pulmonary/Chest: Effort normal.  Neurological: She is alert and oriented to person, place, and time.  Skin: Skin is warm and dry.  Skin of the thumb is dry and hyperkeratotic. Evidence of previous wound, tip of the thumb, is healed. There is a 0.5 cm tissue deficit of the pad of the distal thumb. No erythema. No edema.Small halo of macerated skin, consistent with continuous tight dressing x 3 days. No drainage. Not actively bleeding. Minimally tender. Mupirocin ointment and loose dressing applied.  Psychiatric: She has a normal mood and affect. Her speech is normal and behavior is normal.           Assessment & Plan:   1. Wound, open, finger, initial encounter 2. Need for Tdap vaccination Local wound care. Anticipatory guidance. RTC PRN. - Tdap vaccine greater than or equal to 7yo IM   Fara Chute, PA-C Physician Assistant-Certified [facility]

## 2017-01-24 ENCOUNTER — Telehealth: Payer: Self-pay | Admitting: *Deleted

## 2017-01-24 NOTE — Telephone Encounter (Signed)
I have called and left a message for the patient to call the office. Patient needs a new patient appt.

## 2017-01-25 ENCOUNTER — Telehealth: Payer: Self-pay | Admitting: *Deleted

## 2017-01-25 NOTE — Telephone Encounter (Signed)
I have contacted the patient and gave her the appt date and time for May 3rd at 12:30pm, arrive time of 12:15pm. Patient verbalized understanding.

## 2017-02-01 ENCOUNTER — Ambulatory Visit: Payer: Medicare Other | Attending: Gynecologic Oncology | Admitting: Gynecologic Oncology

## 2017-02-01 ENCOUNTER — Encounter: Payer: Self-pay | Admitting: Gynecologic Oncology

## 2017-02-01 DIAGNOSIS — C541 Malignant neoplasm of endometrium: Secondary | ICD-10-CM | POA: Diagnosis not present

## 2017-02-01 DIAGNOSIS — N950 Postmenopausal bleeding: Secondary | ICD-10-CM | POA: Diagnosis not present

## 2017-02-01 DIAGNOSIS — N83202 Unspecified ovarian cyst, left side: Secondary | ICD-10-CM | POA: Insufficient documentation

## 2017-02-01 DIAGNOSIS — Z79899 Other long term (current) drug therapy: Secondary | ICD-10-CM | POA: Diagnosis not present

## 2017-02-01 NOTE — Patient Instructions (Signed)
Preparing for your Surgery  Plan for surgery on May 15 with Dr. Everitt Amber at [place] will be scheduled for a robotic assisted total hysterectomy, bilateral salpingo-oophorectomy, sentinel lymph node dissection.  Pre-operative Testing -You will receive a phone call from presurgical testing at [facility] to arrange for a pre-operative testing appointment before your surgery.  This appointment normally occurs one to two weeks before your scheduled surgery.   -Bring your insurance card, copy of an advanced directive if applicable, medication list  -At that visit, you will be asked to sign a consent for a possible blood transfusion in case a transfusion becomes necessary during surgery.  The need for a blood transfusion is rare but having consent is a necessary part of your care.     -You should not be taking blood thinners or aspirin at least ten days prior to surgery unless instructed by your surgeon.  STOP TAKING ASPIRIN NOW.  Day Before Surgery at [place] will be asked to take in a light diet the day before surgery.  Avoid carbonated beverages.  You will be advised to have nothing to eat or drink after midnight the evening before.     Eat a light diet the day before surgery.  Examples including soups, broths, toast, yogurt, mashed potatoes.  Things to avoid include carbonated beverages (fizzy beverages), raw fruits and raw vegetables, or beans.    If your bowels are filled with gas, your surgeon will have difficulty visualizing your pelvic organs which increases your surgical risks.  Your role in recovery Your role is to become active as soon as directed by your doctor, while still giving yourself time to heal.  Rest when you feel tired. You will be asked to do the following in order to speed your recovery:  - Cough and breathe deeply. This helps toclear and expand your lungs and can prevent pneumonia. You may be given a spirometer to practice deep  breathing. A staff member will show you how to use the spirometer. - Do mild physical activity. Walking or moving your legs help your circulation and body functions return to normal. A staff member will help you when you try to walk and will provide you with simple exercises. Do not try to get up or walk alone the first time. - Actively manage your pain. Managing your pain lets you move in comfort. We will ask you to rate your pain on a scale of zero to 10. It is your responsibility to tell your doctor or nurse where and how much you hurt so your pain can be treated.  Special Considerations -If you are diabetic, you may be placed on insulin after surgery to have closer control over your blood sugars to promote healing and recovery.  This does not mean that you will be discharged on insulin.  If applicable, your oral antidiabetics will be resumed when you are tolerating a solid diet.  -Your final pathology results from surgery should be available by the Friday after surgery and the results will be relayed to you when available.   Blood Transfusion Information WHAT IS A BLOOD TRANSFUSION? A transfusion is the replacement of blood or some of its parts. Blood is made up of multiple cells which provide different functions.  Red blood cells carry oxygen and are used for blood loss replacement.  White blood cells fight against infection.  Platelets control bleeding.  Plasma helps clot blood.  Other blood products are available for specialized  needs, such as hemophilia or other clotting disorders. BEFORE THE TRANSFUSION  Who gives blood for transfusions?   You may be able to donate blood to be used at a later date on yourself (autologous donation).  Relatives can be asked to donate blood. This is generally not any safer than if you have received blood from a stranger. The same precautions are taken to ensure safety when a relative's blood is donated.  Healthy volunteers who are fully evaluated  to make sure their blood is safe. This is blood bank blood. Transfusion therapy is the safest it has ever been in the practice of medicine. Before blood is taken from a donor, a complete history is taken to make sure that person has no history of diseases nor engages in risky social behavior (examples are intravenous drug use or sexual activity with multiple partners). The donor's travel history is screened to minimize risk of transmitting infections, such as malaria. The donated blood is tested for signs of infectious diseases, such as HIV and hepatitis. The blood is then tested to be sure it is compatible with you in order to minimize the chance of a transfusion reaction. If you or a relative donates blood, this is often done in anticipation of surgery and is not appropriate for emergency situations. It takes many days to process the donated blood. RISKS AND COMPLICATIONS Although transfusion therapy is very safe and saves many lives, the main dangers of transfusion include:   Getting an infectious disease.  Developing a transfusion reaction. This is an allergic reaction to something in the blood you were given. Every precaution is taken to prevent this. The decision to have a blood transfusion has been considered carefully by your caregiver before blood is given. Blood is not given unless the benefits outweigh the risks.

## 2017-02-01 NOTE — Progress Notes (Signed)
Consult Note: Gyn-Onc  Consult was requested by Dr. Royston Sinner for the evaluation of Gloria Cummings 76 y.o. female  CC:  Chief Complaint  Patient presents with  . Endometrial Cancer    New patient    Assessment/Plan:  Gloria Cummings  is a 76 y.o.  year old with grade 1 endometrial cancer.  A detailed discussion was held with the patient and her family with regard to to her endometrial cancer diagnosis. We discussed the standard management options for uterine cancer which includes surgery followed possibly by adjuvant therapy depending on the results of surgery. The options for surgical management include a hysterectomy and removal of the tubes and ovaries possibly with removal of pelvic and para-aortic lymph nodes.If feasible, a minimally invasive approach including a robotic hysterectomy or laparoscopic hysterectomy have benefits including shorter hospital stay, recovery time and better wound healing than with open surgery. The patient has been counseled about these surgical options and the risks of surgery in general including infection, bleeding, damage to surrounding structures (including bowel, bladder, ureters, nerves or vessels), and the postoperative risks of PE/ DVT, and lymphedema. I extensively reviewed the additional risks of robotic hysterectomy including possible need for conversion to open laparotomy.  I discussed positioning during surgery of trendelenberg and risks of minor facial swelling and care we take in preoperative positioning.  After counseling and consideration of her options, she desires to proceed with robotic assisted total hysterectomy with bilateral sapingo-oophorectomy and SLN biopsy.   She will be seen by anesthesia for preoperative clearance and discussion of postoperative pain management.  She was given the opportunity to ask questions, which were answered to her satisfaction, and she is agreement with the above mentioned plan of care.  I have advised her to  stop her aspirin preoperatively.   HPI: Gloria Cummings is a 76 year old P2 who is seen in consultation at the request of Dr Royston Sinner for grade 1 endometrial cancer.  The patient reports postmenopausal bleeding since March, 2018.  She was seen by Dr Royston Sinner for this on 01/09/17 and a TVUS was performed which showed a uterus measuring 6.8x3.2x3.8cm with an endometrium of 1.8cm The left ovary measuringed 2.9cm with a 1.2cm ovarian cyst. The right ovary was not seen.  On 01/17/17 an endometrial biopsy was performed which revealed grade 1 endometrial cancer.   She continues to have persistent light bleeding.  She has an allergy to water based lubricant and betadine.  [place] and and will be returning to live with him postop.   Current Meds:  Outpatient Encounter Prescriptions as of 02/01/2017  Medication Sig  . calcium carbonate (TUMS EX) 750 MG chewable tablet Chew by mouth.  . fish oil-omega-3 fatty acids 1000 MG capsule Take 2 g by mouth daily.  Marland Kitchen telmisartan (MICARDIS) 80 MG tablet Take 80 mg by mouth daily.  . [DISCONTINUED] cholecalciferol (VITAMIN D) 1000 UNITS tablet Take 1,000 Units by mouth 2 (two) times daily.  . [DISCONTINUED] HYDROcodone-acetaminophen (NORCO/VICODIN) 5-325 MG tablet Take 2 tablets by mouth every 4 (four) hours as needed. (Patient not taking: Reported on 01/31/2016)  . [DISCONTINUED] ibuprofen (ADVIL,MOTRIN) 600 MG tablet Take 1 tablet (600 mg total) by mouth 3 (three) times daily.   No facility-administered encounter medications on file as of 02/01/2017.     Allergy:  Allergies  Allergen Reactions  . Betadine [Povidone Iodine] Swelling  . Lisinopril Cough    Social Hx:   Social History  Social History  . Marital status: Divorced    Spouse name: N/A  . Number of children: N/A  . Years of education: N/A   Occupational History  . Not on file.   Social History Main Topics  . Smoking status: Never Smoker  . Smokeless  tobacco: Not on file  . Alcohol use No  . Drug use: No  . Sexual activity: Not on file   Other Topics Concern  . Not on file   Social History Narrative  . No narrative on file    Past Surgical Hx:  Past Surgical History:  Procedure Laterality Date  . BREAST LUMPECTOMY    . COLON SURGERY    . EYE SURGERY    . FOOT SURGERY    . FRACTURE SURGERY    . TUBAL LIGATION      Past Medical Hx:  Past Medical History:  Diagnosis Date  . Anemia   . Arthritis   . Cancer ([place])   . Cataract   . Heart murmur   . Hypertension   . Osteoporosis     Past Gynecological History:  SVD x 1 No LMP recorded. Patient is postmenopausal.  Family Hx:  Family History  Problem Relation Age of Onset  . Stroke Mother   . Heart disease Father   . Stroke Father   . Heart disease Brother   . Heart disease Maternal Grandmother   . Heart disease Maternal Grandfather   . Cancer Paternal Grandfather     Review of Systems:  Constitutional  Feels well,    ENT Normal appearing ears and nares bilaterally Skin/Breast  No rash, sores, jaundice, itching, dryness Cardiovascular  No chest pain, shortness of breath, or edema  Pulmonary  No cough or wheeze.  Gastro Intestinal  No nausea, vomitting, or diarrhoea. No bright red blood per rectum, no abdominal pain, change in bowel movement, or constipation.  Genito Urinary  No frequency, urgency, dysuria, +postmenopausal bleeding Musculo Skeletal  No myalgia, arthralgia, joint swelling or pain  Neurologic  No weakness, numbness, change in gait,  Psychology  No depression, anxiety, insomnia.   Vitals:  Blood pressure (!) 180/68, pulse 74, temperature 98.1 F (36.7 C), temperature source Oral, resp. rate 20, height 5' 3.35" (1.609 m), weight 122 lb 1.6 oz (55.4 kg).  Physical Exam: WD in NAD Neck  Supple NROM, without any enlargements.  Lymph Node Survey No cervical supraclavicular or inguinal adenopathy Cardiovascular  Pulse normal rate,  regularity and rhythm. S1 and S2 normal.  Lungs  Clear to auscultation bilateraly, without wheezes/crackles/rhonchi. Good air movement.  Skin  No rash/lesions/breakdown  Psychiatry  Alert and oriented to person, place, and time  Abdomen  Normoactive bowel sounds, abdomen soft, non-tender and thin without evidence of hernia.  Back No CVA tenderness Genito Urinary  Vulva/vagina: Normal external female genitalia.   No lesions. No discharge or bleeding.  Bladder/urethra:  No lesions or masses, well supported bladder  Vagina: normal  Cervix: Normal appearing, no lesions.  Uterus:  Small, mobile, no parametrial involvement or nodularity.  Adnexa: no palpable masses. Rectal  deferred Extremities  No bilateral cyanosis, clubbing or edema.   [name], MD  02/01/2017, 12:39 PM

## 2017-02-02 ENCOUNTER — Telehealth: Payer: Self-pay | Admitting: *Deleted

## 2017-02-02 ENCOUNTER — Telehealth: Payer: Self-pay | Admitting: Gynecologic Oncology

## 2017-02-02 DIAGNOSIS — Z8542 Personal history of malignant neoplasm of other parts of uterus: Secondary | ICD-10-CM | POA: Insufficient documentation

## 2017-02-02 NOTE — Telephone Encounter (Signed)
Patient called back and moved her appt from MAy 23rd to May 21st

## 2017-02-02 NOTE — Telephone Encounter (Signed)
Left message for the patient about upcoming surgery date of May 10 being available.  Advised her to please call the office if she would like to move her surgery up.

## 2017-02-02 NOTE — Telephone Encounter (Signed)
I have called and left the patient a message to call the office back. Need to move her appt form May 23rd to May 25th.

## 2017-02-05 NOTE — Patient Instructions (Addendum)
Tanica Gaige Burgert  [birth year]   Your procedure is scheduled on: 02/08/17  Report to [facility] Main  Entrance Take [place]  elevators to 3rd floor to  [place] at     1030AM.    Call this number if you have problems the morning of surgery [phone]    Remember: ONLY 1 PERSON MAY GO WITH YOU TO SHORT STAY TO GET  READY MORNING OF YOUR SURGERY.              Eat a light diet the day before your surgery examples are : toast , yougurt ,broth, and soup, AVOID: raw fruits and veggies and carbonated  beverages .  Do not eat food or drink liquids :After Midnight.     Take these medicines the morning of surgery with A SIP OF WATER: NONE                                You may not have any metal on your body including hair pins and              piercings  Do not wear jewelry, make-up, lotions, powders or perfumes, deodorant             Do not wear nail polish.  Do not shave  48 hours prior to surgery.              Do not bring valuables to the hospital. [place].  Contacts, dentures or bridgework may not be worn into surgery.  Leave suitcase in the car. After surgery it may be brought to your room.  :                Please read over the following fact sheets you were given: _____________________________________________________________________             [facility] - Preparing for Surgery Before surgery, you can play an important role.  Because skin is not sterile, your skin needs to be as free of germs as possible.  You can reduce the number of germs on your skin by washing with CHG (chlorahexidine gluconate) soap before surgery.  CHG is an antiseptic cleaner which kills germs and bonds with the skin to continue killing germs even after washing. Please DO NOT use if you have an allergy to CHG or antibacterial soaps.  If your skin becomes reddened/irritated stop using the CHG and inform your nurse when you  arrive at Short Stay. Do not shave (including legs and underarms) for at least 48 hours prior to the first CHG shower.  You may shave your face/neck. Please follow these instructions carefully:  1.  Shower with CHG Soap the night before surgery and the  morning of Surgery.  2.  If you choose to wash your hair, wash your hair first as usual with your  normal  shampoo.  3.  After you shampoo, rinse your hair and body thoroughly to remove the  shampoo.                           4.  Use CHG as you would any other liquid soap.  You can apply chg directly  to the  skin and wash                       Gently with a scrungie or clean washcloth.  5.  Apply the CHG Soap to your body ONLY FROM THE NECK DOWN.   Do not use on face/ open                           Wound or open sores. Avoid contact with eyes, ears mouth and genitals (private parts).                       Wash face,  Genitals (private parts) with your normal soap.             6.  Wash thoroughly, paying special attention to the area where your surgery  will be performed.  7.  Thoroughly rinse your body with warm water from the neck down.  8.  DO NOT shower/wash with your normal soap after using and rinsing off  the CHG Soap.                9.  Pat yourself dry with a clean towel.            10.  Wear clean pajamas.            11.  Place clean sheets on your bed the night of your first shower and do not  sleep with pets. Day of Surgery : Do not apply any lotions/deodorants the morning of surgery.  Please wear clean clothes to the hospital/surgery center.  FAILURE TO FOLLOW THESE INSTRUCTIONS MAY RESULT IN THE CANCELLATION OF YOUR SURGERY PATIENT SIGNATURE_________________________________  NURSE SIGNATURE__________________________________  ________________________________________________________________________  WHAT IS A BLOOD TRANSFUSION? Blood Transfusion Information  A transfusion is the replacement of blood or some of its parts. Blood  is made up of multiple cells which provide different functions.  Red blood cells carry oxygen and are used for blood loss replacement.  White blood cells fight against infection.  Platelets control bleeding.  Plasma helps clot blood.  Other blood products are available for specialized needs, such as hemophilia or other clotting disorders. BEFORE THE TRANSFUSION  Who gives blood for transfusions?   Healthy volunteers who are fully evaluated to make sure their blood is safe. This is blood bank blood. Transfusion therapy is the safest it has ever been in the practice of medicine. Before blood is taken from a donor, a complete history is taken to make sure that person has no history of diseases nor engages in risky social behavior (examples are intravenous drug use or sexual activity with multiple partners). The donor's travel history is screened to minimize risk of transmitting infections, such as malaria. The donated blood is tested for signs of infectious diseases, such as HIV and hepatitis. The blood is then tested to be sure it is compatible with you in order to minimize the chance of a transfusion reaction. If you or a relative donates blood, this is often done in anticipation of surgery and is not appropriate for emergency situations. It takes many days to process the donated blood. RISKS AND COMPLICATIONS Although transfusion therapy is very safe and saves many lives, the main dangers of transfusion include:   Getting an infectious disease.  Developing a transfusion reaction. This is an allergic reaction to something in the blood you were given. Every precaution is taken to prevent this. The  decision to have a blood transfusion has been considered carefully by your caregiver before blood is given. Blood is not given unless the benefits outweigh the risks. AFTER THE TRANSFUSION  Right after receiving a blood transfusion, you will usually feel much better and more energetic. This is  especially true if your red blood cells have gotten low (anemic). The transfusion raises the level of the red blood cells which carry oxygen, and this usually causes an energy increase.  The nurse administering the transfusion will monitor you carefully for complications. HOME CARE INSTRUCTIONS  No special instructions are needed after a transfusion. You may find your energy is better. Speak with your caregiver about any limitations on activity for underlying diseases you may have. SEEK MEDICAL CARE IF:   Your condition is not improving after your transfusion.  You develop redness or irritation at the intravenous (IV) site. SEEK IMMEDIATE MEDICAL CARE IF:  Any of the following symptoms occur over the next 12 hours:  Shaking chills.  You have a temperature by mouth above 102 F (38.9 C), not controlled by medicine.  Chest, back, or muscle pain.  People around you feel you are not acting correctly or are confused.  Shortness of breath or difficulty breathing.  Dizziness and fainting.  You get a rash or develop hives.  You have a decrease in urine output.  Your urine turns a dark color or changes to pink, red, or brown. Any of the following symptoms occur over the next 10 days:  You have a temperature by mouth above 102 F (38.9 C), not controlled by medicine.  Shortness of breath.  Weakness after normal activity.  The white part of the eye turns yellow (jaundice).  You have a decrease in the amount of urine or are urinating less often.  Your urine turns a dark color or changes to pink, red, or brown. Document Released: 09/15/2000 Document Revised: 12/11/2011 Document Reviewed: 05/04/2008 ExitCare Patient Information 2014 [place].  _______________________________________________________________________  Incentive Spirometer  An incentive spirometer is a tool that can help keep your lungs clear and active. This tool measures how well you are filling your lungs  with each breath. Taking long deep breaths may help reverse or decrease the chance of developing breathing (pulmonary) problems (especially infection) following:  A long period of time when you are unable to move or be active. BEFORE THE PROCEDURE   If the spirometer includes an indicator to show your best effort, your nurse or respiratory therapist will set it to a desired goal.  If possible, sit up straight or lean slightly forward. Try not to slouch.  Hold the incentive spirometer in an upright position. INSTRUCTIONS FOR USE  1. Sit on the edge of your bed if possible, or sit up as far as you can in bed or on a chair. 2. Hold the incentive spirometer in an upright position. 3. Breathe out normally. 4. Place the mouthpiece in your mouth and seal your lips tightly around it. 5. Breathe in slowly and as deeply as possible, raising the piston or the ball toward the top of the column. 6. Hold your breath for 3-5 seconds or for as long as possible. Allow the piston or ball to fall to the bottom of the column. 7. Remove the mouthpiece from your mouth and breathe out normally. 8. Rest for a few seconds and repeat Steps 1 through 7 at least 10 times every 1-2 hours when you are awake. Take your time and take a few  normal breaths between deep breaths. 9. The spirometer may include an indicator to show your best effort. Use the indicator as a goal to work toward during each repetition. 10. After each set of 10 deep breaths, practice coughing to be sure your lungs are clear. If you have an incision (the cut made at the time of surgery), support your incision when coughing by placing a pillow or rolled up towels firmly against it. Once you are able to get out of bed, walk around indoors and cough well. You may stop using the incentive spirometer when instructed by your caregiver.  RISKS AND COMPLICATIONS  Take your time so you do not get dizzy or light-headed.  If you are in pain, you may need to  take or ask for pain medication before doing incentive spirometry. It is harder to take a deep breath if you are having pain. AFTER USE  Rest and breathe slowly and easily.  It can be helpful to keep track of a log of your progress. Your caregiver can provide you with a simple table to help with this. If you are using the spirometer at home, follow these instructions: [place] IF:   You are having difficultly using the spirometer.  You have trouble using the spirometer as often as instructed.  Your pain medication is not giving enough relief while using the spirometer.  You develop fever of 100.5 F (38.1 C) or higher. SEEK IMMEDIATE MEDICAL CARE IF:   You cough up bloody sputum that had not been present before.  You develop fever of 102 F (38.9 C) or greater.  You develop worsening pain at or near the incision site. MAKE SURE YOU:   Understand these instructions.  Will watch your condition.  Will get help right away if you are not doing well or get worse. Document Released: 01/29/2007 Document Revised: 12/11/2011 Document Reviewed: 04/01/2007 [facility] Patient Information 2014 [place], [place].   ________________________________________________________________________

## 2017-02-06 ENCOUNTER — Encounter (HOSPITAL_COMMUNITY)
Admission: RE | Admit: 2017-02-06 | Discharge: 2017-02-06 | Disposition: A | Discharge status: Home / Self Care | Payer: Medicare Other | Source: Ambulatory Visit | Attending: Gynecologic Oncology | Admitting: Gynecologic Oncology

## 2017-02-06 ENCOUNTER — Ambulatory Visit (HOSPITAL_COMMUNITY)
Admission: RE | Admit: 2017-02-06 | Discharge: 2017-02-06 | Disposition: A | Discharge status: Home / Self Care | Payer: Medicare Other | Source: Ambulatory Visit | Attending: Gynecologic Oncology | Admitting: Gynecologic Oncology

## 2017-02-06 ENCOUNTER — Encounter (HOSPITAL_COMMUNITY): Payer: Self-pay

## 2017-02-06 DIAGNOSIS — Z01812 Encounter for preprocedural laboratory examination: Secondary | ICD-10-CM | POA: Diagnosis present

## 2017-02-06 DIAGNOSIS — Z01810 Encounter for preprocedural cardiovascular examination: Secondary | ICD-10-CM | POA: Diagnosis present

## 2017-02-06 DIAGNOSIS — C541 Malignant neoplasm of endometrium: Secondary | ICD-10-CM | POA: Diagnosis not present

## 2017-02-06 HISTORY — DX: Family history of other specified conditions: Z84.89

## 2017-02-06 HISTORY — DX: Pneumonia, unspecified organism: J18.9

## 2017-02-06 HISTORY — DX: Dyspnea, unspecified: R06.00

## 2017-02-06 LAB — CBC WITH DIFFERENTIAL/PLATELET
BASOS ABS: 0 10*3/uL (ref 0.0–0.1)
Basophils Relative: 1 %
EOS ABS: 0.2 10*3/uL (ref 0.0–0.7)
EOS PCT: 3 %
HCT: 35 % — ABNORMAL LOW (ref 36.0–46.0)
Hemoglobin: 11.9 g/dL — ABNORMAL LOW (ref 12.0–15.0)
Lymphocytes Relative: 26 %
Lymphs Abs: 1.5 10*3/uL (ref 0.7–4.0)
MCH: 31.1 pg (ref 26.0–34.0)
MCHC: 34 g/dL (ref 30.0–36.0)
MCV: 91.4 fL (ref 78.0–100.0)
MONO ABS: 0.3 10*3/uL (ref 0.1–1.0)
Monocytes Relative: 6 %
Neutro Abs: 3.7 10*3/uL (ref 1.7–7.7)
Neutrophils Relative %: 64 %
PLATELETS: 208 10*3/uL (ref 150–400)
RBC: 3.83 MIL/uL — AB (ref 3.87–5.11)
RDW: 12.6 % (ref 11.5–15.5)
WBC: 5.7 10*3/uL (ref 4.0–10.5)

## 2017-02-06 LAB — COMPREHENSIVE METABOLIC PANEL
ALT: 22 U/L (ref 14–54)
AST: 30 U/L (ref 15–41)
Albumin: 4.2 g/dL (ref 3.5–5.0)
Alkaline Phosphatase: 63 U/L (ref 38–126)
Anion gap: 7 (ref 5–15)
BUN: 24 mg/dL — ABNORMAL HIGH (ref 6–20)
CHLORIDE: 104 mmol/L (ref 101–111)
CO2: 30 mmol/L (ref 22–32)
CREATININE: 1.27 mg/dL — AB (ref 0.44–1.00)
Calcium: 9.6 mg/dL (ref 8.9–10.3)
GFR, EST AFRICAN AMERICAN: 46 mL/min — AB (ref >60)
GFR, EST NON AFRICAN AMERICAN: 40 mL/min — AB (ref >60)
Glucose, Bld: 111 mg/dL — ABNORMAL HIGH (ref 65–99)
POTASSIUM: 4.1 mmol/L (ref 3.5–5.1)
SODIUM: 141 mmol/L (ref 135–145)
Total Bilirubin: 0.4 mg/dL (ref 0.3–1.2)
Total Protein: 7 g/dL (ref 6.5–8.1)

## 2017-02-06 LAB — URINALYSIS, ROUTINE W REFLEX MICROSCOPIC
Bacteria, UA: NONE SEEN
Bilirubin Urine: NEGATIVE
GLUCOSE, UA: NEGATIVE mg/dL
Ketones, ur: NEGATIVE mg/dL
NITRITE: NEGATIVE
PH: 7 (ref 5.0–8.0)
PROTEIN: NEGATIVE mg/dL
SPECIFIC GRAVITY, URINE: 1.014 (ref 1.005–1.030)

## 2017-02-06 LAB — ABO/RH: ABO/RH(D): AB POS

## 2017-02-06 NOTE — Progress Notes (Signed)
Echo 02/24/17 epic

## 2017-02-06 NOTE — Progress Notes (Signed)
BMP and ua done 02/06/17 routed to Dr. Denman George via epic

## 2017-02-08 ENCOUNTER — Ambulatory Visit (HOSPITAL_COMMUNITY)
Admission: RE | Admit: 2017-02-08 | Discharge: 2017-02-09 | Disposition: A | Discharge status: Home / Self Care | Payer: Medicare Other | Source: Ambulatory Visit | Attending: Gynecologic Oncology | Admitting: Gynecologic Oncology

## 2017-02-08 ENCOUNTER — Encounter (HOSPITAL_COMMUNITY)
Admission: RE | Disposition: A | Discharge status: Home / Self Care | Payer: Self-pay | Source: Ambulatory Visit | Attending: Gynecologic Oncology

## 2017-02-08 ENCOUNTER — Ambulatory Visit (HOSPITAL_COMMUNITY): Payer: Medicare Other | Admitting: Anesthesiology

## 2017-02-08 ENCOUNTER — Encounter (HOSPITAL_COMMUNITY): Payer: Self-pay | Admitting: *Deleted

## 2017-02-08 DIAGNOSIS — D271 Benign neoplasm of left ovary: Secondary | ICD-10-CM | POA: Diagnosis not present

## 2017-02-08 DIAGNOSIS — C541 Malignant neoplasm of endometrium: Secondary | ICD-10-CM

## 2017-02-08 DIAGNOSIS — D649 Anemia, unspecified: Secondary | ICD-10-CM | POA: Insufficient documentation

## 2017-02-08 DIAGNOSIS — Z888 Allergy status to other drugs, medicaments and biological substances status: Secondary | ICD-10-CM | POA: Diagnosis not present

## 2017-02-08 DIAGNOSIS — Z7982 Long term (current) use of aspirin: Secondary | ICD-10-CM | POA: Diagnosis not present

## 2017-02-08 DIAGNOSIS — N950 Postmenopausal bleeding: Secondary | ICD-10-CM | POA: Diagnosis not present

## 2017-02-08 DIAGNOSIS — Z91048 Other nonmedicinal substance allergy status: Secondary | ICD-10-CM | POA: Diagnosis not present

## 2017-02-08 DIAGNOSIS — Z79899 Other long term (current) drug therapy: Secondary | ICD-10-CM | POA: Diagnosis not present

## 2017-02-08 HISTORY — PX: ROBOTIC ASSISTED LAP VAGINAL HYSTERECTOMY: SHX2362

## 2017-02-08 HISTORY — PX: SENTINEL NODE BIOPSY: SHX6608

## 2017-02-08 LAB — TYPE AND SCREEN
ABO/RH(D): AB POS
ANTIBODY SCREEN: NEGATIVE

## 2017-02-08 SURGERY — HYSTERECTOMY, VAGINAL, ROBOT-ASSISTED
Anesthesia: General

## 2017-02-08 MED ORDER — PROMETHAZINE HCL 25 MG/ML IJ SOLN: 6.2500 mg | INTRAMUSCULAR | Status: DC | PRN

## 2017-02-08 MED ORDER — ROCURONIUM BROMIDE 50 MG/5ML IV SOSY
PREFILLED_SYRINGE | INTRAVENOUS | Status: AC
  Filled 2017-02-08: qty 5

## 2017-02-08 MED ORDER — HYDROMORPHONE HCL 1 MG/ML IJ SOLN: 0.2000 mg | INTRAMUSCULAR | Status: DC | PRN

## 2017-02-08 MED ORDER — GABAPENTIN 300 MG PO CAPS: 300.0000 mg | ORAL_CAPSULE | Freq: Every day | ORAL | Status: DC

## 2017-02-08 MED ORDER — LIDOCAINE 2% (20 MG/ML) 5 ML SYRINGE
INTRAMUSCULAR | Status: DC | PRN
  Administered 2017-02-08: 80 mg via INTRAVENOUS

## 2017-02-08 MED ORDER — PROPOFOL 10 MG/ML IV BOLUS
INTRAVENOUS | Status: DC | PRN
  Administered 2017-02-08: 120 mg via INTRAVENOUS

## 2017-02-08 MED ORDER — IBUPROFEN 400 MG PO TABS
800.0000 mg | ORAL_TABLET | Freq: Three times a day (TID) | ORAL | Status: DC | PRN
  Administered 2017-02-08 – 2017-02-09 (×2): 800 mg via ORAL
  Filled 2017-02-08 (×2): qty 2

## 2017-02-08 MED ORDER — ONDANSETRON HCL 4 MG/2ML IJ SOLN: 4.0000 mg | Freq: Four times a day (QID) | INTRAMUSCULAR | Status: DC | PRN

## 2017-02-08 MED ORDER — FENTANYL CITRATE (PF) 100 MCG/2ML IJ SOLN
INTRAMUSCULAR | Status: AC
  Filled 2017-02-08: qty 2

## 2017-02-08 MED ORDER — GLYCOPYRROLATE 0.2 MG/ML IJ SOLN
INTRAMUSCULAR | Status: DC | PRN
  Administered 2017-02-08: 0.4 mg via INTRAVENOUS

## 2017-02-08 MED ORDER — ACETAMINOPHEN 325 MG PO TABS
650.0000 mg | ORAL_TABLET | Freq: Four times a day (QID) | ORAL | Status: DC | PRN
  Administered 2017-02-09: 01:00:00 650 mg via ORAL
  Filled 2017-02-08: qty 2

## 2017-02-08 MED ORDER — LACTATED RINGERS IV SOLN
INTRAVENOUS | Status: DC
  Administered 2017-02-08 (×2): via INTRAVENOUS

## 2017-02-08 MED ORDER — HYDROMORPHONE HCL 1 MG/ML IJ SOLN
INTRAMUSCULAR | Status: AC
  Filled 2017-02-08: qty 0.5

## 2017-02-08 MED ORDER — ONDANSETRON HCL 4 MG/2ML IJ SOLN
INTRAMUSCULAR | Status: DC | PRN
  Administered 2017-02-08: 4 mg via INTRAVENOUS

## 2017-02-08 MED ORDER — DEXAMETHASONE SODIUM PHOSPHATE 10 MG/ML IJ SOLN
INTRAMUSCULAR | Status: AC
  Filled 2017-02-08: qty 1

## 2017-02-08 MED ORDER — OXYCODONE-ACETAMINOPHEN 5-325 MG PO TABS: 1.0000 | ORAL_TABLET | ORAL | Status: DC | PRN

## 2017-02-08 MED ORDER — LIDOCAINE 2% (20 MG/ML) 5 ML SYRINGE
INTRAMUSCULAR | Status: AC
  Filled 2017-02-08: qty 5

## 2017-02-08 MED ORDER — ENOXAPARIN SODIUM 40 MG/0.4ML ~~LOC~~ SOLN
40.0000 mg | SUBCUTANEOUS | Status: DC
  Administered 2017-02-09: 08:00:00 40 mg via SUBCUTANEOUS
  Filled 2017-02-08: qty 0.4

## 2017-02-08 MED ORDER — CEFAZOLIN SODIUM-DEXTROSE 2-4 GM/100ML-% IV SOLN
2.0000 g | INTRAVENOUS | Status: AC
  Administered 2017-02-08: 2 g via INTRAVENOUS
  Filled 2017-02-08: qty 100

## 2017-02-08 MED ORDER — SUGAMMADEX SODIUM 200 MG/2ML IV SOLN
INTRAVENOUS | Status: AC
  Filled 2017-02-08: qty 2

## 2017-02-08 MED ORDER — SUGAMMADEX SODIUM 200 MG/2ML IV SOLN
INTRAVENOUS | Status: DC | PRN
  Administered 2017-02-08: 200 mg via INTRAVENOUS

## 2017-02-08 MED ORDER — ONDANSETRON HCL 4 MG/2ML IJ SOLN
INTRAMUSCULAR | Status: AC
  Filled 2017-02-08: qty 2

## 2017-02-08 MED ORDER — LIDOCAINE HCL 1 % IJ SOLN
INTRAMUSCULAR | Status: AC
  Filled 2017-02-08: qty 20

## 2017-02-08 MED ORDER — MEPERIDINE HCL 50 MG/ML IJ SOLN
INTRAMUSCULAR | Status: AC
  Filled 2017-02-08: qty 1

## 2017-02-08 MED ORDER — LACTATED RINGERS IR SOLN
Status: DC | PRN
  Administered 2017-02-08: 1000 mL

## 2017-02-08 MED ORDER — GLYCOPYRROLATE 0.2 MG/ML IV SOSY
PREFILLED_SYRINGE | INTRAVENOUS | Status: AC
  Filled 2017-02-08: qty 5

## 2017-02-08 MED ORDER — ROCURONIUM BROMIDE 50 MG/5ML IV SOSY
PREFILLED_SYRINGE | INTRAVENOUS | Status: DC | PRN
  Administered 2017-02-08: 10 mg via INTRAVENOUS
  Administered 2017-02-08: 50 mg via INTRAVENOUS

## 2017-02-08 MED ORDER — FENTANYL CITRATE (PF) 100 MCG/2ML IJ SOLN
INTRAMUSCULAR | Status: DC | PRN
  Administered 2017-02-08 (×4): 50 ug via INTRAVENOUS

## 2017-02-08 MED ORDER — ONDANSETRON HCL 4 MG PO TABS: 4.0000 mg | ORAL_TABLET | Freq: Four times a day (QID) | ORAL | Status: DC | PRN

## 2017-02-08 MED ORDER — KCL IN DEXTROSE-NACL 20-5-0.45 MEQ/L-%-% IV SOLN
INTRAVENOUS | Status: DC
  Administered 2017-02-08: 19:00:00 via INTRAVENOUS
  Filled 2017-02-08 (×2): qty 1000

## 2017-02-08 MED ORDER — MEPERIDINE HCL 50 MG/ML IJ SOLN
6.2500 mg | INTRAMUSCULAR | Status: DC | PRN
  Administered 2017-02-08: 6.25 mg via INTRAVENOUS

## 2017-02-08 MED ORDER — TELMISARTAN-HCTZ 80-12.5 MG PO TABS: 1.0000 | ORAL_TABLET | Freq: Every day | ORAL | Status: DC

## 2017-02-08 MED ORDER — ASPIRIN EC 81 MG PO TBEC: 81.0000 mg | DELAYED_RELEASE_TABLET | Freq: Every day | ORAL | Status: DC

## 2017-02-08 MED ORDER — STERILE WATER FOR IRRIGATION IR SOLN
Status: DC | PRN
  Administered 2017-02-08: 1000 mL

## 2017-02-08 MED ORDER — HYDROMORPHONE HCL 1 MG/ML IJ SOLN
0.2500 mg | INTRAMUSCULAR | Status: DC | PRN
  Administered 2017-02-08: 0.5 mg via INTRAVENOUS

## 2017-02-08 MED ORDER — IRBESARTAN 150 MG PO TABS: 300.0000 mg | ORAL_TABLET | Freq: Every day | ORAL | Status: DC

## 2017-02-08 MED ORDER — BOOST / RESOURCE BREEZE PO LIQD
1.0000 | Freq: Three times a day (TID) | ORAL | Status: DC
  Administered 2017-02-08: 1 via ORAL

## 2017-02-08 MED ORDER — DEXAMETHASONE SODIUM PHOSPHATE 10 MG/ML IJ SOLN
INTRAMUSCULAR | Status: DC | PRN
  Administered 2017-02-08: 10 mg via INTRAVENOUS

## 2017-02-08 MED ORDER — TRAMADOL HCL 50 MG PO TABS: 100.0000 mg | ORAL_TABLET | Freq: Two times a day (BID) | ORAL | Status: DC | PRN

## 2017-02-08 MED ORDER — PROPOFOL 10 MG/ML IV BOLUS
INTRAVENOUS | Status: AC
  Filled 2017-02-08: qty 20

## 2017-02-08 MED ORDER — ENOXAPARIN SODIUM 40 MG/0.4ML ~~LOC~~ SOLN
40.0000 mg | SUBCUTANEOUS | Status: AC
Start: 2017-02-08 — End: 2017-02-08
  Administered 2017-02-08: 40 mg via SUBCUTANEOUS
  Filled 2017-02-08: qty 0.4

## 2017-02-08 MED ORDER — HYDROCHLOROTHIAZIDE 12.5 MG PO CAPS: 12.5000 mg | ORAL_CAPSULE | Freq: Every day | ORAL | Status: DC

## 2017-02-08 SURGICAL SUPPLY — 88 items
ADH SKN CLS APL DERMABOND .7 (GAUZE/BANDAGES/DRESSINGS) ×2
APL ESCP 34 STRL LF DISP (HEMOSTASIS)
APPLICATOR SURGIFLO ENDO (HEMOSTASIS) IMPLANT
BAG LAPAROSCOPIC 12 15 PORT 16 (BASKET) IMPLANT
BAG RETRIEVAL 12/15 (BASKET)
BAG SPEC RTRVL LRG 6X4 10 (ENDOMECHANICALS)
BANDAGE ADH SHEER 1  50/CT (GAUZE/BANDAGES/DRESSINGS) IMPLANT
CABLE HIGH FREQUENCY MONO STRZ (ELECTRODE) IMPLANT
CELLS DAT CNTRL 66122 CELL SVR (MISCELLANEOUS) IMPLANT
CHLORAPREP W/TINT 26ML (MISCELLANEOUS) ×3 IMPLANT
CONT SPEC 4OZ CLIKSEAL STRL BL (MISCELLANEOUS) IMPLANT
COVER BACK TABLE 60X90IN (DRAPES) ×3 IMPLANT
COVER SURGICAL LIGHT HANDLE (MISCELLANEOUS) ×3 IMPLANT
COVER TIP SHEARS 8 DVNC (MISCELLANEOUS) ×2 IMPLANT
COVER TIP SHEARS 8MM DA VINCI (MISCELLANEOUS) ×1
DERMABOND ADVANCED (GAUZE/BANDAGES/DRESSINGS) ×1
DERMABOND ADVANCED .7 DNX12 (GAUZE/BANDAGES/DRESSINGS) ×2 IMPLANT
DRAPE ARM DVNC X/XI (DISPOSABLE) ×8 IMPLANT
DRAPE COLUMN DVNC XI (DISPOSABLE) ×2 IMPLANT
DRAPE DA VINCI XI ARM (DISPOSABLE) ×4
DRAPE DA VINCI XI COLUMN (DISPOSABLE) ×1
DRAPE SHEET LG 3/4 BI-LAMINATE (DRAPES) ×6 IMPLANT
DRAPE SURG IRRIG POUCH 19X23 (DRAPES) ×3 IMPLANT
DRAPE UTILITY XL STRL (DRAPES) IMPLANT
ELECT PENCIL ROCKER SW 15FT (MISCELLANEOUS) ×3 IMPLANT
ELECT REM PT RETURN 15FT ADLT (MISCELLANEOUS) ×3 IMPLANT
GLOVE BIO SURGEON STRL SZ 6 (GLOVE) ×12 IMPLANT
GLOVE BIO SURGEON STRL SZ 6.5 (GLOVE) ×6 IMPLANT
GOWN STRL NON-REIN LRG LVL3 (GOWN DISPOSABLE) IMPLANT
GOWN STRL REUS W/ TWL LRG LVL3 (GOWN DISPOSABLE) ×4 IMPLANT
GOWN STRL REUS W/TWL LRG LVL3 (GOWN DISPOSABLE) ×6
HANDLE SUCTION POOLE (INSTRUMENTS) IMPLANT
HEMOSTAT SURGICEL 4X8 (HEMOSTASIS) IMPLANT
HOLDER FOLEY CATH W/STRAP (MISCELLANEOUS) ×3 IMPLANT
IRRIG SUCT STRYKERFLOW 2 WTIP (MISCELLANEOUS) ×3
IRRIGATION SUCT STRKRFLW 2 WTP (MISCELLANEOUS) ×2 IMPLANT
KIT BASIN OR (CUSTOM PROCEDURE TRAY) ×3 IMPLANT
KIT PROCEDURE DA VINCI SI (MISCELLANEOUS) ×1
KIT PROCEDURE DVNC SI (MISCELLANEOUS) ×2 IMPLANT
MANIPULATOR UTERINE 4.5 ZUMI (MISCELLANEOUS) ×3 IMPLANT
MARKER SKIN DUAL TIP RULER LAB (MISCELLANEOUS) ×3 IMPLANT
NDL SAFETY ECLIPSE 18X1.5 (NEEDLE) ×2 IMPLANT
NEEDLE HYPO 18GX1.5 SHARP (NEEDLE) ×1
NEEDLE SPNL 18GX3.5 QUINCKE PK (NEEDLE) ×3 IMPLANT
OBTURATOR OPTICAL STANDARD 8MM (TROCAR) ×1
OBTURATOR OPTICAL STND 8 DVNC (TROCAR) ×2
OBTURATOR OPTICALSTD 8 DVNC (TROCAR) ×2 IMPLANT
OCCLUDER COLPOPNEUMO (BALLOONS) ×3 IMPLANT
PAD POSITIONING PINK XL (MISCELLANEOUS) ×3 IMPLANT
PORT ACCESS TROCAR AIRSEAL 12 (TROCAR) IMPLANT
PORT ACCESS TROCAR AIRSEAL 5M (TROCAR)
POUCH SPECIMEN RETRIEVAL 10MM (ENDOMECHANICALS) IMPLANT
RTRCTR WOUND ALEXIS 18CM MED (MISCELLANEOUS)
SCISSORS LAP 5X35 DISP (ENDOMECHANICALS) IMPLANT
SEAL CANN UNIV 5-8 DVNC XI (MISCELLANEOUS) ×8 IMPLANT
SEAL XI 5MM-8MM UNIVERSAL (MISCELLANEOUS) ×4
SET TRI-LUMEN FLTR TB AIRSEAL (TUBING) ×3 IMPLANT
SHEET LAVH (DRAPES) ×3 IMPLANT
SOLUTION ELECTROLUBE (MISCELLANEOUS) ×3 IMPLANT
SPONGE LAP 18X18 X RAY DECT (DISPOSABLE) IMPLANT
SUCTION POOLE HANDLE (INSTRUMENTS)
SURGIFLO W/THROMBIN 8M KIT (HEMOSTASIS) IMPLANT
SUT MNCRL AB 4-0 PS2 18 (SUTURE) ×6 IMPLANT
SUT VIC AB 0 CT1 27 (SUTURE) ×2
SUT VIC AB 0 CT1 27XBRD ANTBC (SUTURE) ×2 IMPLANT
SUT VIC AB 2-0 CT2 27 (SUTURE) IMPLANT
SUT VIC AB 2-0 SH 27 (SUTURE)
SUT VIC AB 2-0 SH 27X BRD (SUTURE) IMPLANT
SUT VIC AB 3-0 PS2 18 (SUTURE)
SUT VIC AB 3-0 PS2 18XBRD (SUTURE) IMPLANT
SUT VIC AB 3-0 SH 27 (SUTURE)
SUT VIC AB 3-0 SH 27X BRD (SUTURE) IMPLANT
SYR 10ML LL (SYRINGE) ×3 IMPLANT
SYR 50ML LL SCALE MARK (SYRINGE) ×3 IMPLANT
TOWEL OR 17X26 10 PK STRL BLUE (TOWEL DISPOSABLE) ×6 IMPLANT
TOWEL OR NON WOVEN STRL DISP B (DISPOSABLE) ×3 IMPLANT
TRAP SPECIMEN MUCOUS 40CC (MISCELLANEOUS) IMPLANT
TRAY FOLEY BAG SILVER LF 14FR (CATHETERS) IMPLANT
TRAY FOLEY W/METER SILVER 16FR (SET/KITS/TRAYS/PACK) ×3 IMPLANT
TRAY LAPAROSCOPIC (CUSTOM PROCEDURE TRAY) ×3 IMPLANT
TROCAR BLADELESS OPT 5 100 (ENDOMECHANICALS) ×3 IMPLANT
TROCAR XCEL 12X100 BLDLESS (ENDOMECHANICALS) ×3 IMPLANT
TROCAR XCEL BLUNT TIP 100MML (ENDOMECHANICALS) IMPLANT
TUBING INSUF HEATED (TUBING) IMPLANT
TUBING NON-CON 1/4 X 20 CONN (TUBING) IMPLANT
UNDERPAD 30X30 (UNDERPADS AND DIAPERS) ×3 IMPLANT
WATER STERILE IRR 1500ML POUR (IV SOLUTION) IMPLANT
YANKAUER SUCT BULB TIP NO VENT (SUCTIONS) IMPLANT

## 2017-02-08 NOTE — Progress Notes (Signed)
Approximately 100 cc bloody drainage from left upper lateral port site- gushed when touched- pressure held- Dr. Denman George notified- orders given- 2x2s -tegaderm-ABD pad- Hypofix -Ice pack applied-O.K. To go to floor per Dr. Denman George and she would see her on the floor at 5:OO P.M.

## 2017-02-08 NOTE — Transfer of Care (Signed)
Immediate Anesthesia Transfer of Care Note  Patient: Gloria Cummings  Procedure(s) Performed: Procedure(s): XI ROBOTIC ASSISTED LAPAROSCOPIC TOTAL HYSTERECTOMY, BILATERAL SALPINGOOPHORECTOMY (N/A) SENTINEL NODE BIOPSY (N/A)  Patient Location: PACU  Anesthesia Type:General  Level of Consciousness: awake and patient cooperative  Airway & Oxygen Therapy: Patient Spontanous Breathing and Patient connected to face mask oxygen  Post-op Assessment: Report given to RN and Post -op Vital signs reviewed and stable  Post vital signs: Reviewed and stable  Last Vitals:  Vitals:   02/08/17 1052  BP: (!) 156/70  Pulse: 69  Resp: 12  Temp: 36.7 C    Last Pain:  Vitals:   02/08/17 1052  TempSrc: Oral      Patients Stated Pain Goal: 4 (61/84/85 [id number])  Complications: No apparent anesthesia complications

## 2017-02-08 NOTE — Progress Notes (Signed)
Called to see patient for bleeding from left lateral port site.  BP (!) 154/63 (BP Location: Right Arm)   Pulse 76   Temp 98.5 F (36.9 C) (Oral)   Resp 16   Ht 5\' 4"  (1.626 m)   Wt 124 lb (56.2 kg)   SpO2 100%   BMI 21.28 kg/m   Active bleeding from open port site.  100cc of bright red blood on surrounding towels.  Pumping from vessel in subcutaneous skin.  Procedure:   Infiltrated skin with 5cc of 1% lidocaine. Deep suture placed of 4-0 vicryl. Interrupted 4-0 vicryl through skin. Dermabond applied and pressure dressing.  Incision completely hemostatic at end of procedure. Patient stable.  [name], MD

## 2017-02-08 NOTE — Anesthesia Preprocedure Evaluation (Signed)
Anesthesia Evaluation  Patient identified by MRN, date of birth, ID band Patient awake    Reviewed: Allergy & Precautions, NPO status , Patient's Chart, lab work & pertinent test results  Airway Mallampati: II  TM Distance: >3 FB Neck ROM: Full    Dental no notable dental hx.    Pulmonary neg pulmonary ROS,    Pulmonary exam normal breath sounds clear to auscultation       Cardiovascular hypertension, Normal cardiovascular exam Rhythm:Regular Rate:Normal     Neuro/Psych negative neurological ROS  negative psych ROS   GI/Hepatic negative GI ROS, Neg liver ROS,   Endo/Other  negative endocrine ROS  Renal/GU negative Renal ROS  negative genitourinary   Musculoskeletal negative musculoskeletal ROS (+)   Abdominal   Peds negative pediatric ROS (+)  Hematology  (+) anemia ,   Anesthesia Other Findings   Reproductive/Obstetrics negative OB ROS                             Anesthesia Physical Anesthesia Plan  ASA: II  Anesthesia Plan: General   Post-op Pain Management:    Induction: Intravenous  Airway Management Planned: Oral ETT  Additional Equipment:   Intra-op Plan:   Post-operative Plan: Extubation in OR  Informed Consent: I have reviewed the patients History and Physical, chart, labs and discussed the procedure including the risks, benefits and alternatives for the proposed anesthesia with the patient or authorized representative who has indicated his/her understanding and acceptance.   Dental advisory given  Plan Discussed with: CRNA and Surgeon  Anesthesia Plan Comments:         Anesthesia Quick Evaluation

## 2017-02-08 NOTE — Progress Notes (Signed)
Demerol 6.25 mg IVP given for shaking and shivering

## 2017-02-08 NOTE — Progress Notes (Signed)
Shaking and shivering stopped.

## 2017-02-08 NOTE — Op Note (Addendum)
OPERATIVE NOTE 02/08/17  Surgeon: [name]   Assistants: Dr Lahoma Crocker (an MD assistant was necessary for tissue manipulation, management of robotic instrumentation, retraction and positioning due to the complexity of the case and hospital policies).   Anesthesia: General endotracheal anesthesia  ASA Class: 3   Pre-operative Diagnosis: grade 1 endometrial cancer  Post-operative Diagnosis: same  Operation: Robotic-assisted laparoscopic total hysterectomy with bilateral salpingoophorectomy, sentinel lymph node biopsy.  Surgeon: [name]  Assistant Surgeon: Lahoma Crocker MD  Anesthesia: GET  Urine Output: 300cc  Operative Findings:  : 6cm normal appearing uterus, normal tubes and ovaries, normal appearing nodes and abdomen  Estimated Blood Loss:  Minimal      Total IV Fluids: 500 ml         Specimens: uterus, cervix, bilateral tubes and ovaries, right obturator SLN, right external iliac SLN, left obturator SLN.          Complications:  None; patient tolerated the procedure well.         Disposition: PACU - hemodynamically stable.  Procedure Details  The patient was seen in the Holding Room. The risks, benefits, complications, treatment options, and expected outcomes were discussed with the patient.  The patient concurred with the proposed plan, giving informed consent.  The site of surgery properly noted/marked. The patient was identified as Gloria Cummings and the procedure verified as a Robotic-assisted hysterectomy with bilateral salpingo oophorectomy. A Time Out was held and the above information confirmed.  After induction of anesthesia, the patient was draped and prepped in the usual sterile manner. Pt was placed in supine position after anesthesia and draped and prepped in the usual sterile manner. The abdominal drape was placed after the CholoraPrep had been allowed to dry for 3 minutes.  Her arms were tucked to her side with all  appropriate precautions.  The shoulders were stabilized with padded shoulder blocks applied to the acromium processes.  The patient was placed in the semi-lithotomy position in Sesser.  The perineum was prepped with Betadine. The patient was then prepped. Foley catheter was placed.  A sterile speculum was placed in the vagina.  The cervix was grasped with a single-tooth tenaculum and dilated with Kennon Rounds dilators. 1mg  total of ICG was injected into the cervical stroma at 2 and 9 o'clock at a 48mm depth (concentration 0..5mg /ml).  The ZUMI uterine manipulator with a medium colpotomizer ring was placed without difficulty.  A pneum occluder balloon was placed over the manipulator.  OG tube placement was confirmed and to suction.   Next, a 5 mm skin incision was made 1 cm below the subcostal margin in the midclavicular line.  The 5 mm Optiview port and scope was used for direct entry.  Opening pressure was under 10 mm CO2.  The abdomen was insufflated and the findings were noted as above.   At this point and all points during the procedure, the patient's intra-abdominal pressure did not exceed 15 mmHg. Next, a 10 mm skin incision was made in the umbilicus and a right and left port was placed about 10 cm lateral to the robot port on the right and left side.  A fourth arm was placed in the left lower quadrant 2 cm above and superior and medial to the anterior superior iliac spine.  All ports were placed under direct visualization.  The patient was placed in steep Trendelenburg.  Bowel was folded away into the upper abdomen.  The robot was docked in the normal manner.  The right and left peritoneum were opened parallel to the IP ligament to open the retroperitoneal spaces bilaterally. The SLN mapping was performed in bilateral pelvic basins. The para rectal and paravesical spaces were opened up. Lymphatic channels were identified travelling to the following visualized sentinel lymph node's: right obturator and  external iliac, left obturator. These SLN's were separated from their surrounding lymphatic tissue, removed and sent for permanent pathology.  The hysterectomy was started after the round ligament on the right side was incised and the retroperitoneum was entered and the pararectal space was developed.  The ureter was noted to be on the medial leaf of the broad ligament.  The peritoneum above the ureter was incised and stretched and the infundibulopelvic ligament was skeletonized, cauterized and cut.  The posterior peritoneum was taken down to the level of the KOH ring.  The anterior peritoneum was also taken down.  The bladder flap was created to the level of the KOH ring.  The uterine artery on the right side was skeletonized, cauterized and cut in the normal manner.  A similar procedure was performed on the left.  The colpotomy was made and the uterus, cervix, bilateral ovaries and tubes were amputated and delivered through the vagina.  Pedicles were inspected and excellent hemostasis was achieved.    The colpotomy at the vaginal cuff was closed with Vicryl on a CT1 needle in a running manner.  Irrigation was used and excellent hemostasis was achieved.  At this point in the procedure was completed.  Robotic instruments were removed under direct visulaization.  The robot was undocked. The 10 mm ports were closed with Vicryl on a UR-5 needle and the fascia was closed with 0 Vicryl on a UR-5 needle.  The skin was closed with 4-0 Vicryl in a subcuticular manner.  Dermabond was applied.  Sponge, lap and needle counts correct x 2.  The patient was taken to the recovery room in stable condition.  The vagina was swabbed with  minimal bleeding noted.   All instrument and needle counts were correct x  3.   The patient was transferred to the recovery room in a stable condition.  [name], MD

## 2017-02-08 NOTE — Anesthesia Procedure Notes (Signed)
Procedure Name: Intubation Date/Time: 02/08/2017 12:28 PM Performed by: Dione Booze Pre-anesthesia Checklist: Emergency Drugs available, Suction available, Patient being monitored and Patient identified Patient Re-evaluated:Patient Re-evaluated prior to inductionOxygen Delivery Method: Circle system utilized Preoxygenation: Pre-oxygenation with 100% oxygen Intubation Type: IV induction Ventilation: Oral airway inserted - appropriate to patient size and Mask ventilation without difficulty Laryngoscope Size: Mac and 4 Grade View: Grade II Tube type: Oral Tube size: 7.5 mm Number of attempts: 1 Airway Equipment and Method: Stylet Placement Confirmation: ETT inserted through vocal cords under direct vision,  positive ETCO2 and breath sounds checked- equal and bilateral Secured at: 21 cm Tube secured with: Tape Dental Injury: Teeth and Oropharynx as per pre-operative assessment

## 2017-02-08 NOTE — Interval H&P Note (Signed)
History and Physical Interval Note:  02/08/2017 18:40 AM  Gloria Cummings  has presented today for surgery, with the diagnosis of ENDOMETRIAL CANCER  The various methods of treatment have been discussed with the patient and family. After consideration of risks, benefits and other options for treatment, the patient has consented to  Procedure(s): XI ROBOTIC ASSISTED LAPAROSCOPIC TOTAL HYSTERECTOMY (N/A) LAPAROSCOPIC BILATERAL SALPINGO OOPHORECTOMY (Bilateral) SENTINEL NODE BIOPSY (N/A) as a surgical intervention .  The patient's history has been reviewed, patient examined, no change in status, stable for surgery.  I have reviewed the patient's chart and labs.  Questions were answered to the patient's satisfaction.     [name]

## 2017-02-08 NOTE — H&P (View-Only) (Signed)
Consult Note: Gyn-Onc  Consult was requested by Dr. Royston Sinner for the evaluation of Gloria Cummings 76 y.o. female  CC:  Chief Complaint  Patient presents with  . Endometrial Cancer    New patient    Assessment/Plan:  Gloria Cummings  is a 76 y.o.  year old with grade 1 endometrial cancer.  A detailed discussion was held with the patient and her family with regard to to her endometrial cancer diagnosis. We discussed the standard management options for uterine cancer which includes surgery followed possibly by adjuvant therapy depending on the results of surgery. The options for surgical management include a hysterectomy and removal of the tubes and ovaries possibly with removal of pelvic and para-aortic lymph nodes.If feasible, a minimally invasive approach including a robotic hysterectomy or laparoscopic hysterectomy have benefits including shorter hospital stay, recovery time and better wound healing than with open surgery. The patient has been counseled about these surgical options and the risks of surgery in general including infection, bleeding, damage to surrounding structures (including bowel, bladder, ureters, nerves or vessels), and the postoperative risks of PE/ DVT, and lymphedema. I extensively reviewed the additional risks of robotic hysterectomy including possible need for conversion to open laparotomy.  I discussed positioning during surgery of trendelenberg and risks of minor facial swelling and care we take in preoperative positioning.  After counseling and consideration of her options, she desires to proceed with robotic assisted total hysterectomy with bilateral sapingo-oophorectomy and SLN biopsy.   She will be seen by anesthesia for preoperative clearance and discussion of postoperative pain management.  She was given the opportunity to ask questions, which were answered to her satisfaction, and she is agreement with the above mentioned plan of care.  I have advised her to  stop her aspirin preoperatively.   HPI: Gloria Cummings is a 76 year old P2 who is seen in consultation at the request of Dr Royston Sinner for grade 1 endometrial cancer.  The patient reports postmenopausal bleeding since March, 2018.  She was seen by Dr Royston Sinner for this on 01/09/17 and a TVUS was performed which showed a uterus measuring 6.8x3.2x3.8cm with an endometrium of 1.8cm The left ovary measuringed 2.9cm with a 1.2cm ovarian cyst. The right ovary was not seen.  On 01/17/17 an endometrial biopsy was performed which revealed grade 1 endometrial cancer.   She continues to have persistent light bleeding.  She has an allergy to water based lubricant and betadine.  [place] and and will be returning to live with him postop.   Current Meds:  Outpatient Encounter Prescriptions as of 02/01/2017  Medication Sig  . calcium carbonate (TUMS EX) 750 MG chewable tablet Chew by mouth.  . fish oil-omega-3 fatty acids 1000 MG capsule Take 2 g by mouth daily.  Marland Kitchen telmisartan (MICARDIS) 80 MG tablet Take 80 mg by mouth daily.  . [DISCONTINUED] cholecalciferol (VITAMIN D) 1000 UNITS tablet Take 1,000 Units by mouth 2 (two) times daily.  . [DISCONTINUED] HYDROcodone-acetaminophen (NORCO/VICODIN) 5-325 MG tablet Take 2 tablets by mouth every 4 (four) hours as needed. (Patient not taking: Reported on 01/31/2016)  . [DISCONTINUED] ibuprofen (ADVIL,MOTRIN) 600 MG tablet Take 1 tablet (600 mg total) by mouth 3 (three) times daily.   No facility-administered encounter medications on file as of 02/01/2017.     Allergy:  Allergies  Allergen Reactions  . Betadine [Povidone Iodine] Swelling  . Lisinopril Cough    Social Hx:   Social History  Social History  . Marital status: Divorced    Spouse name: N/A  . Number of children: N/A  . Years of education: N/A   Occupational History  . Not on file.   Social History Main Topics  . Smoking status: Never Smoker  . Smokeless  tobacco: Not on file  . Alcohol use No  . Drug use: No  . Sexual activity: Not on file   Other Topics Concern  . Not on file   Social History Narrative  . No narrative on file    Past Surgical Hx:  Past Surgical History:  Procedure Laterality Date  . BREAST LUMPECTOMY    . COLON SURGERY    . EYE SURGERY    . FOOT SURGERY    . FRACTURE SURGERY    . TUBAL LIGATION      Past Medical Hx:  Past Medical History:  Diagnosis Date  . Anemia   . Arthritis   . Cancer ([place])   . Cataract   . Heart murmur   . Hypertension   . Osteoporosis     Past Gynecological History:  SVD x 1 No LMP recorded. Patient is postmenopausal.  Family Hx:  Family History  Problem Relation Age of Onset  . Stroke Mother   . Heart disease Father   . Stroke Father   . Heart disease Brother   . Heart disease Maternal Grandmother   . Heart disease Maternal Grandfather   . Cancer Paternal Grandfather     Review of Systems:  Constitutional  Feels well,    ENT Normal appearing ears and nares bilaterally Skin/Breast  No rash, sores, jaundice, itching, dryness Cardiovascular  No chest pain, shortness of breath, or edema  Pulmonary  No cough or wheeze.  Gastro Intestinal  No nausea, vomitting, or diarrhoea. No bright red blood per rectum, no abdominal pain, change in bowel movement, or constipation.  Genito Urinary  No frequency, urgency, dysuria, +postmenopausal bleeding Musculo Skeletal  No myalgia, arthralgia, joint swelling or pain  Neurologic  No weakness, numbness, change in gait,  Psychology  No depression, anxiety, insomnia.   Vitals:  Blood pressure (!) 180/68, pulse 74, temperature 98.1 F (36.7 C), temperature source Oral, resp. rate 20, height 5' 3.35" (1.609 m), weight 122 lb 1.6 oz (55.4 kg).  Physical Exam: WD in NAD Neck  Supple NROM, without any enlargements.  Lymph Node Survey No cervical supraclavicular or inguinal adenopathy Cardiovascular  Pulse normal rate,  regularity and rhythm. S1 and S2 normal.  Lungs  Clear to auscultation bilateraly, without wheezes/crackles/rhonchi. Good air movement.  Skin  No rash/lesions/breakdown  Psychiatry  Alert and oriented to person, place, and time  Abdomen  Normoactive bowel sounds, abdomen soft, non-tender and thin without evidence of hernia.  Back No CVA tenderness Genito Urinary  Vulva/vagina: Normal external female genitalia.   No lesions. No discharge or bleeding.  Bladder/urethra:  No lesions or masses, well supported bladder  Vagina: normal  Cervix: Normal appearing, no lesions.  Uterus:  Small, mobile, no parametrial involvement or nodularity.  Adnexa: no palpable masses. Rectal  deferred Extremities  No bilateral cyanosis, clubbing or edema.   Gloria Eva, MD  02/01/2017, 12:39 PM

## 2017-02-09 ENCOUNTER — Encounter (HOSPITAL_COMMUNITY): Payer: Self-pay | Admitting: Gynecologic Oncology

## 2017-02-09 DIAGNOSIS — C541 Malignant neoplasm of endometrium: Secondary | ICD-10-CM | POA: Diagnosis not present

## 2017-02-09 LAB — BASIC METABOLIC PANEL
ANION GAP: 8 (ref 5–15)
BUN: 19 mg/dL (ref 6–20)
CALCIUM: 8.7 mg/dL — AB (ref 8.9–10.3)
CO2: 26 mmol/L (ref 22–32)
Chloride: 101 mmol/L (ref 101–111)
Creatinine, Ser: 0.9 mg/dL (ref 0.44–1.00)
GFR calc Af Amer: >60 mL/min (ref >60)
GLUCOSE: 169 mg/dL — AB (ref 65–99)
POTASSIUM: 3.8 mmol/L (ref 3.5–5.1)
SODIUM: 135 mmol/L (ref 135–145)

## 2017-02-09 LAB — CBC
HCT: 31.6 % — ABNORMAL LOW (ref 36.0–46.0)
Hemoglobin: 10.5 g/dL — ABNORMAL LOW (ref 12.0–15.0)
MCH: 30.3 pg (ref 26.0–34.0)
MCHC: 33.2 g/dL (ref 30.0–36.0)
MCV: 91.3 fL (ref 78.0–100.0)
PLATELETS: 201 10*3/uL (ref 150–400)
RBC: 3.46 MIL/uL — AB (ref 3.87–5.11)
RDW: 12.5 % (ref 11.5–15.5)
WBC: 12.9 10*3/uL — AB (ref 4.0–10.5)

## 2017-02-09 MED ORDER — TRAMADOL HCL 50 MG PO TABS: 50.0000 mg | ORAL_TABLET | Freq: Four times a day (QID) | ORAL | 0 refills | Status: DC | PRN

## 2017-02-09 NOTE — Anesthesia Postprocedure Evaluation (Signed)
Anesthesia Post Note  Patient: Gloria Cummings  Procedure(s) Performed: Procedure(s) (LRB): XI ROBOTIC ASSISTED LAPAROSCOPIC TOTAL HYSTERECTOMY, BILATERAL SALPINGOOPHORECTOMY (N/A) SENTINEL NODE BIOPSY (N/A)  Patient location during evaluation: PACU Anesthesia Type: General Level of consciousness: awake and alert Pain management: pain level controlled Vital Signs Assessment: post-procedure vital signs reviewed and stable Respiratory status: spontaneous breathing, nonlabored ventilation, respiratory function stable and patient connected to nasal cannula oxygen Cardiovascular status: blood pressure returned to baseline and stable Postop Assessment: no signs of nausea or vomiting Anesthetic complications: no       Last Vitals:  Vitals:   02/09/17 0121 02/09/17 0432  BP: (!) 150/55 (!) 137/52  Pulse: 71 73  Resp: 15 16  Temp: 37.2 C 37.1 C    Last Pain:  Vitals:   02/09/17 0825  TempSrc:   PainSc: 0-No pain                 Shauniece Kwan S

## 2017-02-09 NOTE — Progress Notes (Signed)
Nutrition Brief Note  Patient identified on the Malnutrition Screening Tool (MST) Report  Patient in room with family at bedside. Pt states she doesn't feel too hungry but ate a good breakfast this morning regardless. Pt consumed a boiled egg, OJ, blueberry muffin and peach yogurt. States she tolerated the food with issue. Pt with stable weight over the past year.   Wt Readings from Last 15 Encounters:  02/08/17 124 lb (56.2 kg)  02/06/17 123 lb (55.8 kg)  02/01/17 122 lb 1.6 oz (55.4 kg)  01/31/16 124 lb (56.2 kg)  08/26/15 130 lb (59 kg)  11/11/11 134 lb 3.2 oz (60.9 kg)    Body mass index is 21.28 kg/m. Patient meets criteria for normal based on current BMI.   Current diet order is Heart Healthy, patient is consuming approximately 100% of meals at this time. Labs and medications reviewed.   No nutrition interventions warranted at this time. If nutrition issues arise, please consult RD.   Clayton Bibles, MS, RD, LDN Pager: [phone] After Hours Pager: [phone]

## 2017-02-09 NOTE — Discharge Summary (Signed)
Physician Discharge Summary  Patient ID: Gloria Cummings MRN: [id number] DOB/AGE: 04-11-1941 76 y.o.  Admit date: 02/08/2017 Discharge date: 02/09/2017  Admission Diagnoses: Endometrial cancer [facility])  Discharge Diagnoses:  Principal Problem:   Endometrial cancer [facility])   Discharged Condition:  The patient is in good condition and stable for discharge.    Hospital Course: On 02/08/2017, the patient underwent the following: Procedure(s): XI ROBOTIC ASSISTED LAPAROSCOPIC TOTAL HYSTERECTOMY, BILATERAL SALPINGO-OOPHORECTOMY SENTINEL NODE BIOPSY.   The postoperative course was uneventful except for active bleeding from open port site on left abdomen on 02/08/17 post-opthat was sutured by Dr. Denman George.  She was discharged to home on postoperative day 1 tolerating a regular diet, voiding, passing flatus, ambulating, with no pain reported.    Consults: None  Significant Diagnostic Studies: None  Treatments: surgery: see above  Discharge Exam: Blood pressure 135/61, pulse 66, temperature 99 F (37.2 C), temperature source Oral, resp. rate 16, height 5\' 4"  (1.626 m), weight 124 lb (56.2 kg), SpO2 97 %. General appearance: alert, cooperative and no distress Resp: clear to auscultation bilaterally Cardio: regular rate and rhythm, S1, S2 normal, no murmur, click, rub or gallop GI: soft, non-tender; bowel sounds normal; no masses,  no organomegaly Extremities: extremities normal, atraumatic, no cyanosis or edema Incision/Wound: Lap sites with dermabond without drainage or erythema, ecchymosis noted around the left port site with sutures in place  Disposition: 01-Home or Self Care  Discharge Instructions    Call MD for:  difficulty breathing, headache or visual disturbances    Complete by:  As directed    Call MD for:  extreme fatigue    Complete by:  As directed    Call MD for:  hives    Complete by:  As directed    Call MD for:  persistant dizziness or light-headedness    Complete by:  As  directed    Call MD for:  persistant nausea and vomiting    Complete by:  As directed    Call MD for:  redness, tenderness, or signs of infection (pain, swelling, redness, odor or green/yellow discharge around incision site)    Complete by:  As directed    Call MD for:  severe uncontrolled pain    Complete by:  As directed    Call MD for:  temperature >100.4    Complete by:  As directed    Diet - low sodium heart healthy    Complete by:  As directed    Driving Restrictions    Complete by:  As directed    No driving for 1 week.  Do not take narcotics and drive.   Increase activity slowly    Complete by:  As directed    Lifting restrictions    Complete by:  As directed    No lifting greater than 10 lbs.   Sexual Activity Restrictions    Complete by:  As directed    No sexual activity, nothing in the vagina, for 8 weeks.     Allergies as of 02/09/2017      Reactions   Lubricants    Water based lubricant  Unspecified dermatitis   Betadine [povidone Iodine] Swelling   Lisinopril Cough      Medication List    TAKE these medications   aspirin EC 81 MG tablet Take 81 mg by mouth daily.   calcium carbonate 750 MG chewable tablet Commonly known as:  TUMS EX Chew 1 tablet by mouth daily.   fish oil-omega-3 fatty acids  1000 MG capsule Take 4 g by mouth daily.   multivitamin with minerals tablet Take 1 tablet by mouth daily.   telmisartan-hydrochlorothiazide 80-12.5 MG tablet Commonly known as:  MICARDIS HCT Take 1 tablet by mouth daily.   traMADol 50 MG tablet Commonly known as:  ULTRAM Take 1 tablet (50 mg total) by mouth every 6 (six) hours as needed.   triamcinolone lotion 0.1 % Commonly known as:  KENALOG Apply 1 application topically 2 (two) times daily as needed (skin irritation).   Vitamin D 2000 units tablet Take 2,000 Units by mouth daily.        Greater than thirty minutes were spend for face to face discharge instructions and discharge orders/summary  in EPIC.   Signed: Aqib Lough DEAL 02/09/2017, 10:39 AM

## 2017-02-09 NOTE — Progress Notes (Signed)
Pt was discharged home today. Instructions were reviewed with patient, and questions were answered. Pt was taken to main entrance via wheelchair by NT.  

## 2017-02-09 NOTE — Progress Notes (Signed)
Patient is refusing her 1000 medications because she is going home.

## 2017-02-09 NOTE — Discharge Instructions (Signed)
02/08/2017  Return to work: 4 weeks  Activity: 1. Be up and out of the bed during the day.  Take a nap if needed.  You may walk up steps but be careful and use the hand rail.  Stair climbing will tire you more than you think, you may need to stop part way and rest.   2. No lifting or straining for 6 weeks.  3. No driving for 1 weeks.  Do Not drive if you are taking narcotic pain medicine.  4. Shower daily.  Use soap and water on your incision and pat dry; don't rub.   5. No sexual activity and nothing in the vagina for 8 weeks.  Medications:  - Take ibuprofen and tylenol first line for pain control. You can take ibuprofen 200-400 mg every six to eight hours as needed for pain or tylenol 325-650 mg every four to six hours with a max of 4,000 mg in a 24 hour period for pain.  Take these regularly (every 6 hours) to decrease the build up of pain.  - If necessary, for severe pain not relieved by ibuprofen, take percocet.  - While taking percocet you should take sennakot every night to reduce the likelihood of constipation. If this causes diarrhea, stop its use.  Diet: 1. Low sodium Heart Healthy Diet is recommended.  2. It is safe to use a laxative if you have difficulty moving your bowels.   Wound Care: 1. Keep clean and dry.  Shower daily.  Reasons to call the Doctor:   Fever - Oral temperature greater than 100.4 degrees Fahrenheit  Foul-smelling vaginal discharge  Difficulty urinating  Nausea and vomiting  Increased pain at the site of the incision that is unrelieved with pain medicine.  Difficulty breathing with or without chest pain  New calf pain especially if only on one side  Sudden, continuing increased vaginal bleeding with or without clots.   Follow-up: 1. See Everitt Amber in 1 weeks.  Contacts: For questions or concerns you should contact:  Dr. Everitt Amber at [phone] After hours and on week-ends call [phone] and ask to speak to the physician on  call for Gynecologic Oncology

## 2017-02-19 ENCOUNTER — Encounter: Payer: Self-pay | Admitting: Gynecologic Oncology

## 2017-02-19 ENCOUNTER — Ambulatory Visit: Payer: Medicare Other | Attending: Gynecologic Oncology | Admitting: Gynecologic Oncology

## 2017-02-19 VITALS — BP 139/75 | HR 76 | Temp 98.0°F | Resp 18 | Wt 122.2 lb

## 2017-02-19 DIAGNOSIS — Z823 Family history of stroke: Secondary | ICD-10-CM | POA: Diagnosis not present

## 2017-02-19 DIAGNOSIS — R897 Abnormal histological findings in specimens from other organs, systems and tissues: Secondary | ICD-10-CM | POA: Diagnosis not present

## 2017-02-19 DIAGNOSIS — C541 Malignant neoplasm of endometrium: Secondary | ICD-10-CM | POA: Diagnosis not present

## 2017-02-19 DIAGNOSIS — Z9851 Tubal ligation status: Secondary | ICD-10-CM | POA: Insufficient documentation

## 2017-02-19 DIAGNOSIS — Z79891 Long term (current) use of opiate analgesic: Secondary | ICD-10-CM | POA: Insufficient documentation

## 2017-02-19 DIAGNOSIS — Z90710 Acquired absence of both cervix and uterus: Secondary | ICD-10-CM | POA: Diagnosis not present

## 2017-02-19 DIAGNOSIS — Z809 Family history of malignant neoplasm, unspecified: Secondary | ICD-10-CM | POA: Diagnosis not present

## 2017-02-19 DIAGNOSIS — I10 Essential (primary) hypertension: Secondary | ICD-10-CM | POA: Insufficient documentation

## 2017-02-19 DIAGNOSIS — Z7189 Other specified counseling: Secondary | ICD-10-CM

## 2017-02-19 DIAGNOSIS — Z719 Counseling, unspecified: Secondary | ICD-10-CM

## 2017-02-19 DIAGNOSIS — Z7982 Long term (current) use of aspirin: Secondary | ICD-10-CM | POA: Diagnosis not present

## 2017-02-19 DIAGNOSIS — Z8542 Personal history of malignant neoplasm of other parts of uterus: Secondary | ICD-10-CM

## 2017-02-19 DIAGNOSIS — Z8249 Family history of ischemic heart disease and other diseases of the circulatory system: Secondary | ICD-10-CM | POA: Diagnosis not present

## 2017-02-19 DIAGNOSIS — Z9889 Other specified postprocedural states: Secondary | ICD-10-CM | POA: Insufficient documentation

## 2017-02-19 NOTE — Progress Notes (Signed)
Follow-up Note: Gyn-Onc  Consult was requested by Dr. Royston Sinner for the evaluation of Gloria Cummings 76 y.o. female  CC:  Chief Complaint  Patient presents with  . Endometrial Cancer    Assessment/Plan:  [name]  is a 76 y.o.  year old with stage IA grade 1 endometrial cancer. MSI unstable on IHC.  Pathology revealed low risk factors for recurrence, therefore no adjuvant therapy is recommended according to NCCN guidelines.  I discussed risk for recurrence and typical symptoms encouraged her to notify us of these should they develop between visits.  I recommend she have follow-up every 6 months for 5 years in accordance with NCCN guidelines. Those visits should include symptom assessment, physical exam and pelvic examination. Pap smears are not indicated or recommended in the routine surveillance of endometrial cancer.  I recommend referral to genetics to evaluate for Lynch syndrome given MSI instability noted on IHC.  HPI: Gloria Cummings is a 76 year old P2 who is seen in consultation at the request of Dr Royston Sinner for grade 1 endometrial cancer.  The patient reports postmenopausal bleeding since March, 2018.  She was seen by Dr Royston Sinner for this on 01/09/17 and a TVUS was performed which showed a uterus measuring 6.8x3.2x3.8cm with an endometrium of 1.8cm The left ovary measuringed 2.9cm with a 1.2cm ovarian cyst. The right ovary was not seen.  On 01/17/17 an endometrial biopsy was performed which revealed grade 1 endometrial cancer.   She continues to have persistent light bleeding.  She has an allergy to water based lubricant and betadine.  Interval Hx:  On 02/08/17 she underwent robotic assisted total hysterectomy, BSO, SLN biopsy. Surgery was complicated by a postop bleed superficially from the left lateral incision that required additional sutures be placed. Postop she has done very well with only light vaginal spotting for 5 days (beginning 1 week after  surgery).  Final pathology revealed a 3.7cm grade 1 tumor 0.1 of 0.5cm myometrial invasion (inner half) and no LVSI. There was no metastatic disease in the adnexa, cervix or lymph nodes. Immunohistochemistry revealed loss of nuclear expression of  MLH1 and PMS2.  Current Meds:  Outpatient Encounter Prescriptions as of 02/19/2017  Medication Sig  . aspirin EC 81 MG tablet Take 81 mg by mouth daily.  . calcium carbonate (TUMS EX) 750 MG chewable tablet Chew 1 tablet by mouth daily.   . Cholecalciferol (VITAMIN D) 2000 units tablet Take 2,000 Units by mouth daily.  . fish oil-omega-3 fatty acids 1000 MG capsule Take 4 g by mouth daily.   . Multiple Vitamins-Minerals (MULTIVITAMIN WITH MINERALS) tablet Take 1 tablet by mouth daily.  Marland Kitchen telmisartan-hydrochlorothiazide (MICARDIS HCT) 80-12.5 MG tablet Take 1 tablet by mouth daily.  . traMADol (ULTRAM) 50 MG tablet Take 1 tablet (50 mg total) by mouth every 6 (six) hours as needed.  . triamcinolone lotion (KENALOG) 0.1 % Apply 1 application topically 2 (two) times daily as needed (skin irritation).   No facility-administered encounter medications on file as of 02/19/2017.     Allergy:  Allergies  Allergen Reactions  . Lubricants     Water based lubricant  Unspecified dermatitis  . Betadine [Povidone Iodine] Swelling  . Lisinopril Cough    Social Hx:   Social History   Social History  . Marital status: Divorced    Spouse name: N/A  . Number of children: N/A  . Years of education: N/A   Occupational History  . Not on file.   Social  History Main Topics  . Smoking status: Never Smoker  . Smokeless tobacco: Never Used  . Alcohol use No  . Drug use: No  . Sexual activity: Not Currently   Other Topics Concern  . Not on file   Social History Narrative  . No narrative on file    Past Surgical Hx:  Past Surgical History:  Procedure Laterality Date  . BREAST LUMPECTOMY     left lumpectomy  . COLON SURGERY     procedures  . EYE  SURGERY     implants on both eyes  . FOOT SURGERY    . FRACTURE SURGERY    . ROBOTIC ASSISTED LAP VAGINAL HYSTERECTOMY N/A 02/08/2017   Procedure: XI ROBOTIC ASSISTED LAPAROSCOPIC TOTAL HYSTERECTOMY, BILATERAL SALPINGOOPHORECTOMY;  Surgeon: Everitt Amber, MD;  Location: [facility];  Service: Gynecology;  Laterality: N/A;  . SENTINEL NODE BIOPSY N/A 02/08/2017   Procedure: SENTINEL NODE BIOPSY;  Surgeon: Everitt Amber, MD;  Location: [facility];  Service: Gynecology;  Laterality: N/A;  . TUBAL LIGATION    . WISDOM TOOTH EXTRACTION      Past Medical Hx:  Past Medical History:  Diagnosis Date  . Anemia   . Arthritis   . Cancer ([place])    endo metrial   left breast  . Cataract   . Dyspnea    hx of  . Family history of adverse reaction to anesthesia    Mother died following surgery stroke  . Heart murmur   . Hypertension   . Osteoporosis   . Pneumonia     Past Gynecological History:  SVD x 1 No LMP recorded. Patient is postmenopausal.  Family Hx:  Family History  Problem Relation Age of Onset  . Stroke Mother   . Heart disease Father   . Stroke Father   . Heart disease Brother   . Heart disease Maternal Grandmother   . Heart disease Maternal Grandfather   . Cancer Paternal Grandfather   . Cancer Paternal Aunt     Review of Systems:  Constitutional  Feels well,    ENT Normal appearing ears and nares bilaterally Skin/Breast  No rash, sores, jaundice, itching, dryness Cardiovascular  No chest pain, shortness of breath, or edema  Pulmonary  No cough or wheeze.  Gastro Intestinal  No nausea, vomitting, or diarrhoea. No bright red blood per rectum, no abdominal pain, change in bowel movement, or constipation.  Genito Urinary  No frequency, urgency, dysuria, no bleeding Musculo Skeletal  No myalgia, arthralgia, joint swelling or pain  Neurologic  No weakness, numbness, change in gait,  Psychology  No depression, anxiety, insomnia.   Vitals:  Blood pressure 139/75, pulse 76,  temperature 98 F (36.7 C), temperature source Oral, resp. rate 18, weight 122 lb 3.2 oz (55.4 kg).  Physical Exam: WD in NAD Neck  Supple NROM, without any enlargements.  Lymph Node Survey No cervical supraclavicular or inguinal adenopathy Cardiovascular  Pulse normal rate, regularity and rhythm. S1 and S2 normal.  Lungs  Clear to auscultation bilateraly, without wheezes/crackles/rhonchi. Good air movement.  Skin  No rash/lesions/breakdown  Psychiatry  Alert and oriented to person, place, and time  Abdomen  Normoactive bowel sounds, abdomen soft, non-tender and thin without evidence of hernia. Incisions: healing normally. Extensive ecchymosis around left lateral incision but no collected hematoma. Nontender. Back No CVA tenderness Genito Urinary: deferred. Rectal  deferred Extremities  No bilateral cyanosis, clubbing or edema.   30 minutes of direct face to face counseling time was spent with  the patient. This included discussion about prognosis, therapy recommendations and postoperative side effects and are beyond the scope of routine postoperative care.    [name], MD  02/19/2017, 10:47 AM

## 2017-02-19 NOTE — Patient Instructions (Signed)
Your cancer was stage 1 and low risk therefore no additional treatment is recommended. We recommend that you see a genetics counselor to evaluate for in increased risk for other cancers.  We will schedule you to see Joylene John NP to evaluate the vaginal cuff incision for healing.  We will schedule you for follow-up with Dr Denman George in November, 2018.

## 2017-02-21 ENCOUNTER — Ambulatory Visit: Payer: Medicare Other | Admitting: Gynecologic Oncology

## 2017-03-04 ENCOUNTER — Telehealth: Payer: Self-pay | Admitting: Genetics

## 2017-03-04 ENCOUNTER — Encounter: Payer: Self-pay | Admitting: Genetics

## 2017-03-04 NOTE — Telephone Encounter (Signed)
Pt has been scheduled for the pt to see Vicente Males for genetic counseling on 6/26 at 2pm. Will mail the pt a letter w/the appt date and time.

## 2017-03-05 NOTE — Anesthesia Postprocedure Evaluation (Signed)
Anesthesia Post Note  Patient: Gloria Cummings  Procedure(s) Performed: Procedure(s) (LRB): XI ROBOTIC ASSISTED LAPAROSCOPIC TOTAL HYSTERECTOMY, BILATERAL SALPINGOOPHORECTOMY (N/A) SENTINEL NODE BIOPSY (N/A)     Anesthesia Post Evaluation  Last Vitals:  Vitals:   02/09/17 0946 02/09/17 1300  BP: 135/61 (!) 136/53  Pulse: 66   Resp: 16   Temp: 37.2 C 37.6 C    Last Pain:  Vitals:   02/09/17 1300  TempSrc: Oral  PainSc:                  Jeriah Corkum S

## 2017-03-05 NOTE — Addendum Note (Signed)
Addendum  created 03/05/17 1433 by Phoenyx Paulsen, MD   Sign clinical note    

## 2017-03-12 ENCOUNTER — Other Ambulatory Visit: Payer: Medicare Other

## 2017-03-12 ENCOUNTER — Ambulatory Visit (HOSPITAL_BASED_OUTPATIENT_CLINIC_OR_DEPARTMENT_OTHER): Payer: Medicare Other | Admitting: Genetic Counselor

## 2017-03-12 ENCOUNTER — Encounter: Payer: Self-pay | Admitting: Genetic Counselor

## 2017-03-12 DIAGNOSIS — Z803 Family history of malignant neoplasm of breast: Secondary | ICD-10-CM | POA: Diagnosis not present

## 2017-03-12 DIAGNOSIS — Z800 Family history of malignant neoplasm of digestive organs: Secondary | ICD-10-CM | POA: Insufficient documentation

## 2017-03-12 DIAGNOSIS — C541 Malignant neoplasm of endometrium: Secondary | ICD-10-CM | POA: Diagnosis not present

## 2017-03-12 DIAGNOSIS — Z315 Encounter for genetic counseling: Secondary | ICD-10-CM

## 2017-03-12 DIAGNOSIS — Z86010 Personal history of colonic polyps: Secondary | ICD-10-CM

## 2017-03-12 DIAGNOSIS — Z853 Personal history of malignant neoplasm of breast: Secondary | ICD-10-CM

## 2017-03-12 DIAGNOSIS — Z8042 Family history of malignant neoplasm of prostate: Secondary | ICD-10-CM

## 2017-03-12 NOTE — Progress Notes (Signed)
REFERRING PROVIDER: Leamon Arnt, [address] [place], [place] [address]  PRIMARY PROVIDER:  Leamon Arnt, MD  PRIMARY REASON FOR VISIT:  1. Endometrial cancer ([place])   2. Family history of breast cancer   3. Family history of prostate cancer   4. Family history of stomach cancer   5. Family history of colon cancer   6. History of colon polyps   7. History of breast cancer      HISTORY OF PRESENT ILLNESS:   Gloria Cummings, a 76 y.o. female, was seen for a [facility] cancer genetics consultation at the request of Dr. Jonni Sanger due to a personal and family history of cancer.  Gloria Cummings presents to clinic today to discuss the possibility of a hereditary predisposition to cancer, genetic testing, and to further clarify her future cancer risks, as well as potential cancer risks for family members.   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 This was treated with lumpectomy.  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      CANCER HISTORY:   No history exists.     HORMONAL RISK FACTORS:  Menarche was at age 36.  First live birth at age 47.  OCP use for approximately 10+ years.  Ovaries intact: no.  Hysterectomy: yes.  Menopausal status: postmenopausal.  HRT use: 14 years. Colonoscopy: yes; 13 hyperplastic polyps. Mammogram within the last year: yes. Number of breast biopsies: 1. Up to date with pelvic exams:  yes. Any excessive radiation exposure in the past:  no  Past Medical History:  Diagnosis Date  . Anemia   . Arthritis   . Cancer ([place])    endo metrial   left breast  . Cataract   . Dyspnea    hx of  . Family history of adverse reaction to anesthesia    Mother died following surgery stroke  . Family history of breast cancer   . Family history of colon cancer   . Family history of prostate cancer   . Family  history of stomach cancer   . Heart murmur   . History of colon polyps   . Hypertension   . Osteoporosis   . Pneumonia     Past Surgical History:  Procedure Laterality Date  . BREAST LUMPECTOMY     left lumpectomy  . COLON SURGERY     procedures  . EYE SURGERY     implants on both eyes  . FOOT SURGERY    . FRACTURE SURGERY    . ROBOTIC ASSISTED LAP VAGINAL HYSTERECTOMY N/A 02/08/2017   Procedure: XI ROBOTIC ASSISTED LAPAROSCOPIC TOTAL HYSTERECTOMY, BILATERAL SALPINGOOPHORECTOMY;  Surgeon: Everitt Amber, MD;  Location: [facility];  Service: Gynecology;  Laterality: N/A;  . SENTINEL NODE BIOPSY N/A 02/08/2017   Procedure: SENTINEL NODE BIOPSY;  Surgeon: Everitt Amber, MD;  Location: [facility];  Service: Gynecology;  Laterality: N/A;  . TUBAL LIGATION    . WISDOM TOOTH EXTRACTION      Social History   Social History  . Marital status: Divorced    Spouse name: N/A  . Number of children: N/A  . Years of education: N/A   Social History Main Topics  . Smoking status: Never Smoker  . Smokeless tobacco: Never Used  . Alcohol use No  . Drug use: No  .  Sexual activity: Not Currently   Other Topics Concern  . None   Social History Narrative  . None     FAMILY HISTORY:  We obtained a detailed, 4-generation family history.  Significant diagnoses are listed below: Family History  Problem Relation Age of Onset  . Stroke Mother   . Heart disease Father   . Stroke Father   . Heart disease Brother   . Heart disease Maternal Grandmother   . Heart disease Maternal Grandfather   . Cancer Paternal Grandfather        NOS  . Breast cancer Paternal Aunt        dx over 64  . Stomach cancer Paternal Uncle   . Prostate cancer Paternal Uncle   . Cancer Paternal Aunt        possible colon cancer  . Breast cancer Cousin        paternal first cousin dx over 30    The patient has one daughter who is cancer free.  She has one brother who is cancer free.  He has a daughter who had breast cancer  in her 9's.  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  The patient's mother died of a stroke at 32.  She had three sisters and a brother.  There was no other reported family history of cancer on the maternal side of the family.  The patient's father died at 65 from dementia and a stroke.  He had multiple full and half siblings.  The patient is only aware of the full siblings.  He had a sister who had breast cancer and who also had a daughter with breast cancer.  Another sister had either colon cancer or polyps, and he had a brother who had both stomach and prostate cancer.  His father died of a non specific cancer, and his mother died in childbirth.  Gloria Cummings is unaware of previous family history of genetic testing for hereditary cancer risks. Patient's maternal ancestors are of [place] and [place] descent, and paternal ancestors are of [place], [place] and [place] Huguenot descent. There is no reported Ashkenazi Jewish ancestry. There is no known consanguinity.  GENETIC COUNSELING ASSESSMENT: Gloria Cummings is a 76 y.o. female with a personal and family history of cancer which is somewhat suggestive of a hereditary cancer syndrome and predisposition to cancer. We, therefore, discussed and recommended the following at today's visit.   DISCUSSION: We discussed that her diagnosis of uterine cancer and the family history of cancer, and the tumor testing is suggestive of Lynch syndrome. We reviewed her tumor testing and that there was loss of MLH1 and PMS2 suggesting that her tumor could be associated with a mutation in one of these genes.  We also discussed that at times it is suggestive of a sporadic cancer. Tumor testing for Lynch syndrome can help sort this out, and paired germline and tumor testing can be ordered. We discussed hereditary breast cancer testing and that about 5-10% of breast cancer is hereditary with most cases due to BRCA mutations.  We  reviewed the characteristics, features and inheritance patterns of hereditary cancer syndromes. We also discussed genetic testing, including the appropriate family members to test, the process of testing, insurance coverage and turn-around-time for results. We discussed the implications of a negative, positive and/or variant of uncertain significant result. We recommended Gloria Cummings pursue genetic testing for the TumorNext-Lynch with CancerNext gene panel. The CancerNext gene panel offered by  [facility] includes sequencing and rearrangement analysis for the following 32 genes:   APC, ATM, BARD1, BMPR1A, BRCA1, BRCA2, BRIP1, CDH1, CDK4, CDKN2A, CHEK2, EPCAM, GREM1, MLH1, MRE11A, MSH2, MSH6, MUTYH, NBN, NF1, PALB2, PMS2, POLD1, POLE, PTEN, RAD50, RAD51D, SMAD4, SMARCA4, STK11, and TP53.    Based on Gloria Cummings's personal and family history of cancer, she meets medical criteria for genetic testing. Despite that she meets criteria, she may still have an out of pocket cost. We discussed that if her out of pocket cost for testing is over $100, the laboratory will call and confirm whether she wants to proceed with testing.  If the out of pocket cost of testing is less than $100 she will be billed by the genetic testing laboratory.   PLAN: After considering the risks, benefits, and limitations, Gloria Cummings  provided informed consent to pursue genetic testing and the blood sample was sent to [facility] for analysis of the CancerNext gene test and tumor testing of Lynch syndrome. Results should be available within approximately 8 weeks' time, at which point they will be disclosed by telephone to Gloria Cummings, as will any additional recommendations warranted by these results.  The patient has also asked that we try to reach her daughter to relay the results as she may be on vacation and not be able to be reached. Gloria Cummings will receive a summary of her genetic counseling visit and a  copy of her results once available. This information will also be available in Epic. We encouraged Gloria Cummings to remain in contact with cancer genetics annually so that we can continuously update the family history and inform her of any changes in cancer genetics and testing that may be of benefit for her family. Gloria Cummings's questions were answered to her satisfaction today. Our contact information was provided should additional questions or concerns arise.  Lastly, we encouraged Gloria Cummings to remain in contact with cancer genetics annually so that we can continuously update the family history and inform her of any changes in cancer genetics and testing that may be of benefit for this family.   Ms.  Cummings's questions were answered to her satisfaction today. Our contact information was provided should additional questions or concerns arise. Thank you for the referral and allowing [place] to share in the care of your patient.   Miryah Ralls P. Florene Glen, [place], [facility] Certified Genetic Counselor [email or web address] phone: [phone]  The patient was seen for a total of 60 minutes in face-to-face genetic counseling.  This patient was discussed with Drs. Magrinat, Lindi Adie and/or Burr Medico who agrees with the above.    _______________________________________________________________________ For Office Staff:  Number of people involved in session: 2 Was an Intern/ student involved with case: no

## 2017-03-20 ENCOUNTER — Encounter: Payer: Self-pay | Admitting: Gynecologic Oncology

## 2017-03-20 ENCOUNTER — Ambulatory Visit: Payer: Medicare Other | Attending: Gynecologic Oncology | Admitting: Gynecologic Oncology

## 2017-03-20 VITALS — BP 164/112 | HR 64 | Temp 98.0°F | Resp 20 | Wt 124.0 lb

## 2017-03-20 DIAGNOSIS — N83202 Unspecified ovarian cyst, left side: Secondary | ICD-10-CM | POA: Diagnosis not present

## 2017-03-20 DIAGNOSIS — Z9889 Other specified postprocedural states: Secondary | ICD-10-CM | POA: Insufficient documentation

## 2017-03-20 DIAGNOSIS — Z800 Family history of malignant neoplasm of digestive organs: Secondary | ICD-10-CM | POA: Diagnosis not present

## 2017-03-20 DIAGNOSIS — Z90710 Acquired absence of both cervix and uterus: Secondary | ICD-10-CM | POA: Insufficient documentation

## 2017-03-20 DIAGNOSIS — Z888 Allergy status to other drugs, medicaments and biological substances status: Secondary | ICD-10-CM | POA: Diagnosis not present

## 2017-03-20 DIAGNOSIS — C541 Malignant neoplasm of endometrium: Secondary | ICD-10-CM | POA: Diagnosis present

## 2017-03-20 DIAGNOSIS — Z7982 Long term (current) use of aspirin: Secondary | ICD-10-CM | POA: Insufficient documentation

## 2017-03-20 DIAGNOSIS — Z823 Family history of stroke: Secondary | ICD-10-CM | POA: Diagnosis not present

## 2017-03-20 DIAGNOSIS — Z79899 Other long term (current) drug therapy: Secondary | ICD-10-CM | POA: Insufficient documentation

## 2017-03-20 DIAGNOSIS — Z8249 Family history of ischemic heart disease and other diseases of the circulatory system: Secondary | ICD-10-CM | POA: Diagnosis not present

## 2017-03-20 DIAGNOSIS — Z8042 Family history of malignant neoplasm of prostate: Secondary | ICD-10-CM | POA: Diagnosis not present

## 2017-03-20 DIAGNOSIS — Z803 Family history of malignant neoplasm of breast: Secondary | ICD-10-CM | POA: Diagnosis not present

## 2017-03-20 DIAGNOSIS — N950 Postmenopausal bleeding: Secondary | ICD-10-CM | POA: Diagnosis not present

## 2017-03-20 NOTE — Patient Instructions (Signed)
Please call our office when you return on July 2 if we need to remove the dermabond (glue) from your incision.  No intercourse for 2 more weeks.  Please call for any questions or concerns.  Plan to follow up in six months or sooner if needed.

## 2017-03-20 NOTE — Progress Notes (Signed)
Follow Up Note: Gyn-Onc  Gloria Cummings 76 y.o. female  CC:  Chief Complaint  Patient presents with  . Endometrial Cancer    post-op follow up    HPI:  Gloria Cummings is a 76 year old female initially seen at the request of Dr Royston Sinner for grade 1 endometrial cancer.  She presented with postmenopausal bleeding since March, 2018.  She was evaluated by Dr Royston Sinner on 01/09/17 and a TVUS was performed which showed a uterus measuring 6.8x3.2x3.8cm with an endometrium of 1.8cm The left ovary measuringed 2.9cm with a 1.2cm ovarian cyst. The right ovary was not seen.  On 01/17/17 an endometrial biopsy was performed which revealed grade 1 endometrial cancer.  On Feb 08, 2017, she underwent a robotic-assisted laparoscopic total hysterectomy with bilateral salpingoophorectomy, sentinel lymph node biopsy with Dr. Everitt Amber.  Surgery was complicated by a postop bleed superficially from the left lateral incision that required additional sutures be placed.  Final pathology revealed 3.7cm grade 1 tumor 0.1 of 0.5cm myometrial invasion (inner half) and no LVSI. There was no metastatic disease in the adnexa, cervix or lymph nodes. Immunohistochemistry revealed loss of nuclear expression of  MLH1 and PMS2.  Interval History:  She presents today alone for post-operative follow up.  She reports doing well since last visit.  Tolerating diet with no nausea or emesis.  Bowels and bladder functioning.  No pain reported.  She states she continues to improve everyday.  No vaginal bleeding or discharge reported.  She had her genetics appointment and is awaiting the results.  She is planning on going dancing tonight.  She recently saw her PCP.  She states she continues to take her BP medication and when asked about her BP today she states "I know it is high, but who cares" in a pleasant, energetic tone.  No concerns voiced.    Review of Systems Constitutional: Feels well. No fever, chills, early satiety, change in appetite or  weight. Cardiovascular: No chest pain, shortness of breath, or edema.  Pulmonary: No cough or wheeze.  Gastrointestinal: No nausea, vomiting, or diarrhea. No bright red blood per rectum or change in bowel movement.  Genitourinary: No frequency, urgency, or dysuria. No vaginal bleeding or discharge.  Musculoskeletal: No myalgia or joint pain. Neurologic: No weakness, numbness, or change in gait.  Psychology: No depression, anxiety, or insomnia.   Current Meds:  Outpatient Encounter Prescriptions as of 03/20/2017  Medication Sig  . aspirin EC 81 MG tablet Take 81 mg by mouth daily.  . calcium carbonate (TUMS EX) 750 MG chewable tablet Chew 1 tablet by mouth daily.   . Cholecalciferol (VITAMIN D) 2000 units tablet Take 2,000 Units by mouth daily.  . fish oil-omega-3 fatty acids 1000 MG capsule Take 4 g by mouth daily.   . Multiple Vitamins-Minerals (MULTIVITAMIN WITH MINERALS) tablet Take 1 tablet by mouth daily.  Marland Kitchen telmisartan-hydrochlorothiazide (MICARDIS HCT) 80-12.5 MG tablet Take 1 tablet by mouth daily.  . traMADol (ULTRAM) 50 MG tablet Take 1 tablet (50 mg total) by mouth every 6 (six) hours as needed.  . triamcinolone lotion (KENALOG) 0.1 % Apply 1 application topically 2 (two) times daily as needed (skin irritation).   No facility-administered encounter medications on file as of 03/20/2017.     Allergy:  Allergies  Allergen Reactions  . Lubricants     Water based lubricant  Unspecified dermatitis  . Betadine [Povidone Iodine] Swelling  . Lisinopril Cough    Social Hx:   Social History  Social History  . Marital status: Divorced    Spouse name: N/A  . Number of children: N/A  . Years of education: N/A   Occupational History  . Not on file.   Social History Main Topics  . Smoking status: Never Smoker  . Smokeless tobacco: Never Used  . Alcohol use No  . Drug use: No  . Sexual activity: Not Currently   Other Topics Concern  . Not on file   Social History  Narrative  . No narrative on file    Past Surgical Hx:  Past Surgical History:  Procedure Laterality Date  . BREAST LUMPECTOMY     left lumpectomy  . COLON SURGERY     procedures  . EYE SURGERY     implants on both eyes  . FOOT SURGERY    . FRACTURE SURGERY    . ROBOTIC ASSISTED LAP VAGINAL HYSTERECTOMY N/A 02/08/2017   Procedure: XI ROBOTIC ASSISTED LAPAROSCOPIC TOTAL HYSTERECTOMY, BILATERAL SALPINGOOPHORECTOMY;  Surgeon: Everitt Amber, MD;  Location: [facility];  Service: Gynecology;  Laterality: N/A;  . SENTINEL NODE BIOPSY N/A 02/08/2017   Procedure: SENTINEL NODE BIOPSY;  Surgeon: Everitt Amber, MD;  Location: [facility];  Service: Gynecology;  Laterality: N/A;  . TUBAL LIGATION    . WISDOM TOOTH EXTRACTION      Past Medical Hx:  Past Medical History:  Diagnosis Date  . Anemia   . Arthritis   . Cancer ([place])    endo metrial   left breast  . Cataract   . Dyspnea    hx of  . Family history of adverse reaction to anesthesia    Mother died following surgery stroke  . Family history of breast cancer   . Family history of colon cancer   . Family history of prostate cancer   . Family history of stomach cancer   . Heart murmur   . History of colon polyps   . Hypertension   . Osteoporosis   . Pneumonia     Family Hx:  Family History  Problem Relation Age of Onset  . Stroke Mother   . Heart disease Father   . Stroke Father   . Heart disease Brother   . Heart disease Maternal Grandmother   . Heart disease Maternal Grandfather   . Cancer Paternal Grandfather        NOS  . Breast cancer Paternal Aunt        dx over 55  . Stomach cancer Paternal Uncle   . Prostate cancer Paternal Uncle   . Cancer Paternal Aunt        possible colon cancer  . Breast cancer Cousin        paternal first cousin dx over 49    Vitals:  Blood pressure (!) 164/112, pulse 64, temperature 98 F (36.7 C), temperature source Oral, resp. rate 20, weight 124 lb (56.2 kg), SpO2 99 %.  Physical Exam:   General: Well developed, well nourished female in no acute distress. Alert and oriented x 3.  Cardiovascular: Regular rate and rhythm. S1 and S2 normal.  Lungs: Clear to auscultation bilaterally. No wheezes/crackles/rhonchi noted.  Skin: No rashes or lesions present. Back: No CVA tenderness.  Abdomen: Abdomen soft, non-tender and obese. Active bowel sounds in all quadrants. No evidence of a fluid wave or abdominal masses.  Lap sites well healed without erythema.  Left abdominal lap site with dermabond glue in place but sides peeled up and suture still present.  No erythema or drainage.  Genitourinary:    Vulva/vagina: Normal external female genitalia. No lesions.    Urethra: No lesions or masses    Vagina: Atrophic without any lesions. Vaginal cuff intact.  No palpable masses. No vaginal bleeding or drainage noted.  Extremities: No bilateral cyanosis, edema, or clubbing.   Assessment/Plan:  76 year old female with stage IA grade 1 endometrial cancer. MSI unstable on IHC.  No adjuvant therapy recommended.  Genetics evaluation completed and awaiting results.  She is doing well post-operatively.  Advised to wait two more weeks before resuming intercourse for a total of 8 weeks post-op.  Signs and symptoms of recurrence discussed.  She is to follow up in six months or sooner if needed.  She is to call our office on July 2 when she returns from [place] if her incision needs to be assessed.  Advised to call for any needs or concerns.      CROSS, MELISSA DEAL, NP 03/20/2017, 10:54 AM

## 2017-03-27 ENCOUNTER — Encounter: Payer: Medicare Other | Admitting: Genetics

## 2017-03-27 ENCOUNTER — Other Ambulatory Visit: Payer: Medicare Other

## 2017-04-12 ENCOUNTER — Telehealth: Payer: Self-pay | Admitting: Genetic Counselor

## 2017-04-12 ENCOUNTER — Encounter: Payer: Self-pay | Admitting: Genetic Counselor

## 2017-04-12 DIAGNOSIS — Z1379 Encounter for other screening for genetic and chromosomal anomalies: Secondary | ICD-10-CM | POA: Insufficient documentation

## 2017-04-12 NOTE — Telephone Encounter (Signed)
LM on VM with good news on genetic test results.  Left CB instructions.

## 2017-04-16 ENCOUNTER — Encounter: Payer: Self-pay | Admitting: Genetic Counselor

## 2017-04-16 ENCOUNTER — Ambulatory Visit: Payer: Self-pay | Admitting: Genetic Counselor

## 2017-04-16 ENCOUNTER — Telehealth: Payer: Self-pay | Admitting: Genetic Counselor

## 2017-04-16 DIAGNOSIS — Z853 Personal history of malignant neoplasm of breast: Secondary | ICD-10-CM

## 2017-04-16 DIAGNOSIS — Z1379 Encounter for other screening for genetic and chromosomal anomalies: Secondary | ICD-10-CM

## 2017-04-16 DIAGNOSIS — Z803 Family history of malignant neoplasm of breast: Secondary | ICD-10-CM

## 2017-04-16 DIAGNOSIS — Z800 Family history of malignant neoplasm of digestive organs: Secondary | ICD-10-CM

## 2017-04-16 DIAGNOSIS — Z8042 Family history of malignant neoplasm of prostate: Secondary | ICD-10-CM

## 2017-04-16 DIAGNOSIS — C541 Malignant neoplasm of endometrium: Secondary | ICD-10-CM

## 2017-04-16 NOTE — Telephone Encounter (Signed)
Revealed negative genetic testing on both the hereditary CancerNExt testing and the tumor testing for Lynch syndrome.  Somatic testing suggests that her uterine tumor was a sporadic cancer, and the pattern of LOH on the IHC was due to methylation of MLH1.  Patient voiced relief.  I will mail a copy of the results to her home.

## 2017-04-16 NOTE — Progress Notes (Signed)
HPI: Ms. Retz was previously seen in the [place] clinic due to a personal history of breast and uterine cancer and a family history of cancer and concerns regarding a hereditary predisposition to cancer. Please refer to our prior cancer genetics clinic note for more information regarding Ms. Romanski's medical, social and family histories, and our assessment and recommendations, at the time. Ms. Alverson's recent genetic test results were disclosed to her, as were recommendations warranted by these results. These results and recommendations are discussed in more detail below.  CANCER HISTORY:    Endometrial cancer ([place])   02/01/2017 Initial Diagnosis    Endometrial cancer ([place])     04/12/2017 Genetic Testing    Negative genetic testing on the CancerNext panel.  The CancerNext gene panel offered by [employer] includes sequencing and rearrangement analysis for the following 32 genes:   APC, ATM, BARD1, BMPR1A, BRCA1, BRCA2, BRIP1, CDH1, CDK4, CDKN2A, CHEK2, EPCAM, GREM1, MLH1, MRE11A, MSH2, MSH6, MUTYH, NBN, NF1, PALB2, PMS2, POLD1, POLE, PTEN, RAD50, RAD51D, SMAD4, SMARCA4, STK11, and TP53.  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  Copy-neutral loss of heterozygosity (CN-LOH) is the duplication of one allele in conjunction with the loss of the other allele and is only clinically relevant in the presence of an accompanying pathogenic alteration within the corresponding mismatch repair gene. These tumor results are most consistent with somatic/acquired inactivation of the MLH1 gene. Taken with the germline results demonstrating absence of pathogenic mutations or likely pathogenic variants (VLPs) in the mismatch repair (MMR) genes, the likelihood that this individual has Lynch syndrome/HNPCC is greatly decreased; however, the possibility of an undetected variant of  either germline or somatic origin due to mosaicism and other rare etiologies cannot be ruled out by the current methodology. Correlation with clinical history and external tumor testing results is advised.        FAMILY HISTORY:  We obtained a detailed, 4-generation family history.  Significant diagnoses are listed below: Family History  Problem Relation Age of Onset  . Stroke Mother   . Heart disease Father   . Stroke Father   . Heart disease Brother   . Heart disease Maternal Grandmother   . Heart disease Maternal Grandfather   . Cancer Paternal Grandfather        NOS  . Breast cancer Paternal Aunt        dx over 30  . Stomach cancer Paternal Uncle   . Prostate cancer Paternal Uncle   . Cancer Paternal Aunt        possible colon cancer  . Breast cancer Cousin        paternal first cousin dx over 17    The patient has one daughter who is cancer free.  She has one brother who is cancer free.  He has a daughter who had breast cancer in her 68's.  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  The patient's mother died of a stroke at 43.  She had three sisters and a brother.  There was no other reported family history of cancer on the maternal side of the family.  The patient's father died at 94 from dementia and a stroke.  He had multiple full and half siblings.  The patient is only aware of the full siblings.  He had a sister who had breast cancer and  who also had a daughter with breast cancer.  Another sister had either colon cancer or polyps, and he had a brother who had both stomach and prostate cancer.  His father died of a non specific cancer, and his mother died in childbirth.  Ms. Riemenschneider is unaware of previous family history of genetic testing for hereditary cancer risks. Patient's maternal ancestors are of [place] and cherokee [place] descent, and paternal ancestors are of [place], [place] and [place] Huguenot descent. There is no reported  Ashkenazi Jewish ancestry. There is no known consanguinity.  GENETIC TEST RESULTS: Genetic testing reported out on April 11, 2017 through the [facility] with CancerNext panel found no deleterious mutations.  The CancerNext gene panel offered by [employer] includes sequencing and rearrangement analysis for the following 32 genes:   APC, ATM, BARD1, BMPR1A, BRCA1, BRCA2, BRIP1, CDH1, CDK4, CDKN2A, CHEK2, EPCAM, GREM1, MLH1, MRE11A, MSH2, MSH6, MUTYH, NBN, NF1, PALB2, PMS2, POLD1, POLE, PTEN, RAD50, RAD51D, SMAD4, SMARCA4, STK11, and TP53.   Tumor testing is most consistent with somatic/acquired inactivation of MLH1.  Taken with the germline results demonstrating absence of pathogenic mutations in the mismatch repair genes, the likelihood that Ms. Strahm has Lynch syndrome is greatly decreased. The test report has been scanned into EPIC and is located under the Molecular Pathology section of the Results Review tab.   We discussed with Ms. Knepp that since the current genetic testing is not perfect, it is possible there may be a gene mutation in one of these genes that current testing cannot detect, but that chance is small. We also discussed, that it is possible that another gene that has not yet been discovered, or that we have not yet tested, is responsible for the cancer diagnoses in the family, and it is, therefore, important to remain in touch with cancer genetics in the future so that we can continue to offer Ms. Pangilinan the most up to date genetic testing.      CANCER SCREENING RECOMMENDATIONS: This result is reassuring and indicates that Ms. Riedlinger likely does not have an increased risk for a future cancer due to a mutation in one of these genes. This normal test also suggests that Ms. Autry's cancer was most likely not due to an inherited predisposition associated with one of these genes.  Most cancers happen by chance and this negative test suggests that her cancer falls  into this category.  We, therefore, recommended she continue to follow the cancer management and screening guidelines provided by her oncology and primary healthcare provider.   RECOMMENDATIONS FOR FAMILY MEMBERS: Women in this family might be at some increased risk of developing cancer, over the general population risk, simply due to the family history of cancer. We recommended women in this family have a yearly mammogram beginning at age 85, or 43 years younger than the earliest onset of cancer, an annual clinical breast exam, and perform monthly breast self-exams. Women in this family should also have a gynecological exam as recommended by their primary provider. All family members should have a colonoscopy by age 66.  FOLLOW-UP: Lastly, we discussed with Ms. Toulouse that cancer genetics is a rapidly advancing field and it is possible that new genetic tests will be appropriate for her and/or her family members in the future. We encouraged her to remain in contact with cancer genetics on an annual basis so we can update her personal and family histories and let her know of advances in cancer genetics that may benefit this family.  Our contact number was provided. Ms. Konicek's questions were answered to her satisfaction, and she knows she is welcome to call us at anytime with additional questions or concerns.   Roma Kayser, MS, [facility] Certified Genetic Counselor [email or web address]

## 2017-04-20 ENCOUNTER — Telehealth: Payer: Self-pay | Admitting: *Deleted

## 2017-04-20 NOTE — Telephone Encounter (Signed)
Patient called and wanted to speak with Melissa, offered to help the patient. Patient preferred to wait to speak with Melissa. Patient informed that Lenna Sciara will call her back on Monday. Please asked to be called back at the following land line phone number [phone] in [place].

## 2017-04-23 ENCOUNTER — Telehealth: Payer: Self-pay | Admitting: Gynecologic Oncology

## 2017-04-23 NOTE — Telephone Encounter (Signed)
Returned call to patient.  Patient calling about spotting noted after intercourse.  All questions answered. Her most recent episode of intercourse went well with no spotting after.  Advised her to continue to monitor and to call for heavy bleeding.

## 2017-08-06 ENCOUNTER — Encounter: Payer: Self-pay | Admitting: Gynecologic Oncology

## 2017-08-06 ENCOUNTER — Ambulatory Visit: Payer: Medicare Other | Attending: Gynecologic Oncology | Admitting: Gynecologic Oncology

## 2017-08-06 VITALS — BP 157/61 | HR 64 | Temp 98.0°F | Resp 20 | Ht 64.5 in | Wt 122.9 lb

## 2017-08-06 DIAGNOSIS — Z7982 Long term (current) use of aspirin: Secondary | ICD-10-CM | POA: Diagnosis not present

## 2017-08-06 DIAGNOSIS — Z780 Asymptomatic menopausal state: Secondary | ICD-10-CM | POA: Insufficient documentation

## 2017-08-06 DIAGNOSIS — M810 Age-related osteoporosis without current pathological fracture: Secondary | ICD-10-CM | POA: Diagnosis not present

## 2017-08-06 DIAGNOSIS — Z90710 Acquired absence of both cervix and uterus: Secondary | ICD-10-CM

## 2017-08-06 DIAGNOSIS — I10 Essential (primary) hypertension: Secondary | ICD-10-CM | POA: Diagnosis not present

## 2017-08-06 DIAGNOSIS — C541 Malignant neoplasm of endometrium: Secondary | ICD-10-CM

## 2017-08-06 DIAGNOSIS — Z8542 Personal history of malignant neoplasm of other parts of uterus: Secondary | ICD-10-CM

## 2017-08-06 DIAGNOSIS — N950 Postmenopausal bleeding: Secondary | ICD-10-CM | POA: Diagnosis not present

## 2017-08-06 DIAGNOSIS — Z79899 Other long term (current) drug therapy: Secondary | ICD-10-CM | POA: Diagnosis not present

## 2017-08-06 DIAGNOSIS — M1990 Unspecified osteoarthritis, unspecified site: Secondary | ICD-10-CM | POA: Diagnosis not present

## 2017-08-06 DIAGNOSIS — R877 Abnormal histological findings in specimens from female genital organs: Secondary | ICD-10-CM

## 2017-08-06 NOTE — Patient Instructions (Signed)
Please notify Dr Denman George at phone number [phone] if you notice vaginal bleeding, new pelvic or abdominal pains, bloating, feeling full easy, or a change in bladder or bowel function.   Please follow-up with Dr Royston Sinner in May/June 2019. Please follow-up with Dr Denman George in November, 2019.

## 2017-08-06 NOTE — Progress Notes (Signed)
Follow-up Note: Gyn-Onc  Consult was requested by Dr. Royston Sinner for the evaluation of Gloria Cummings 76 y.o. female  CC:  Chief Complaint  Patient presents with  . Endometrial cancer [facility])    Assessment/Plan:  Gloria Cummings  is a 76 y.o.  year old with stage IA grade 1 endometrial cancer. 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 Negative testing for Lynch syndrome. Clinically free of disease.  Pathology revealed low risk factors for recurrence, therefore no adjuvant therapy is recommended according to NCCN guidelines.  I recommend she have follow-up every 6 months for 5 years in accordance with NCCN guidelines. Those visits should include symptom assessment, physical exam and pelvic examination. Pap smears are not indicated or recommended in the routine surveillance of endometrial cancer.  She will follow up with Dr Royston Sinner in 6 months and with me in 12 months.  HPI: Gloria Cummings is a 76 year old P2 who is seen in consultation at the request of Dr Royston Sinner for grade 1 endometrial cancer.  The patient reports postmenopausal bleeding since March, 2018.  She was seen by Dr Royston Sinner for this on 01/09/17 and a TVUS was performed which showed a uterus measuring 6.8x3.2x3.8cm with an endometrium of 1.8cm The left ovary measuringed 2.9cm with a 1.2cm ovarian cyst. The right ovary was not seen.  On 01/17/17 an endometrial biopsy was performed which revealed grade 1 endometrial cancer.   She continues to have persistent light bleeding.  She has an allergy to water based lubricant and betadine.  Interval Hx:  On 02/08/17 she underwent robotic assisted total hysterectomy, BSO, SLN biopsy. Surgery was complicated by a postop bleed superficially from the left lateral incision that required additional sutures be placed. Postop she has done very well with only light vaginal spotting for 5 days (beginning 1 week after surgery).  Final pathology revealed a 3.7cm grade 1 tumor 0.1 of 0.5cm  myometrial invasion (inner half) and no LVSI. There was no metastatic disease in the adnexa, cervix or lymph nodes. Immunohistochemistry revealed loss of nuclear expression of  MLH1 and PMS2. Lynch syndrome testing was negative.  She has done well since surgery with no complaints and no vaginal bleeding.  Current Meds:  Outpatient Encounter Medications as of 08/06/2017  Medication Sig  . aspirin EC 81 MG tablet Take 81 mg by mouth daily.  . calcium carbonate (TUMS EX) 750 MG chewable tablet Chew 1 tablet by mouth daily.   . Cholecalciferol (VITAMIN D) 2000 units tablet Take 2,000 Units by mouth daily.  . fish oil-omega-3 fatty acids 1000 MG capsule Take 4 g by mouth daily.   . Multiple Vitamins-Minerals (MULTIVITAMIN WITH MINERALS) tablet Take 1 tablet by mouth daily.  Marland Kitchen telmisartan-hydrochlorothiazide (MICARDIS HCT) 80-12.5 MG tablet Take 1 tablet by mouth daily.  . traMADol (ULTRAM) 50 MG tablet Take 1 tablet (50 mg total) by mouth every 6 (six) hours as needed.  . triamcinolone lotion (KENALOG) 0.1 % Apply 1 application topically 2 (two) times daily as needed (skin irritation).   No facility-administered encounter medications on file as of 08/06/2017.     Allergy:  Allergies  Allergen Reactions  . Lubricants     Water based lubricant  Unspecified dermatitis  . Betadine [Povidone Iodine] Swelling  . Lisinopril Cough    Social Hx:   Social History   Socioeconomic History  . Marital status: Divorced    Spouse name: Not on file  . Number of children: Not on file  .  Years of education: Not on file  . Highest education level: Not on file  Social Needs  . Financial resource strain: Not on file  . Food insecurity - worry: Not on file  . Food insecurity - inability: Not on file  . Transportation needs - medical: Not on file  . Transportation needs - non-medical: Not on file  Occupational History  . Not on file  Tobacco Use  . Smoking status: Never Smoker  . Smokeless tobacco:  Never Used  Substance and Sexual Activity  . Alcohol use: No  . Drug use: No  . Sexual activity: Not Currently  Other Topics Concern  . Not on file  Social History Narrative  . Not on file    Past Surgical Hx:  Past Surgical History:  Procedure Laterality Date  . BREAST LUMPECTOMY     left lumpectomy  . COLON SURGERY     procedures  . EYE SURGERY     implants on both eyes  . FOOT SURGERY    . FRACTURE SURGERY    . TUBAL LIGATION    . WISDOM TOOTH EXTRACTION      Past Medical Hx:  Past Medical History:  Diagnosis Date  . Anemia   . Arthritis   . Cancer ([place])    endo metrial   left breast  . Cataract   . Dyspnea    hx of  . Family history of adverse reaction to anesthesia    Mother died following surgery stroke  . Family history of breast cancer   . Family history of colon cancer   . Family history of prostate cancer   . Family history of stomach cancer   . Heart murmur   . History of colon polyps   . Hypertension   . Osteoporosis   . Pneumonia     Past Gynecological History:  SVD x 1 No LMP recorded. Patient is postmenopausal.  Family Hx:  Family History  Problem Relation Age of Onset  . Stroke Mother   . Heart disease Father   . Stroke Father   . Heart disease Brother   . Heart disease Maternal Grandmother   . Heart disease Maternal Grandfather   . Cancer Paternal Grandfather        NOS  . Breast cancer Paternal Aunt        dx over 72  . Stomach cancer Paternal Uncle   . Prostate cancer Paternal Uncle   . Cancer Paternal Aunt        possible colon cancer  . Breast cancer Cousin        paternal first cousin dx over 35    Review of Systems:  Constitutional  Feels well,    ENT Normal appearing ears and nares bilaterally Skin/Breast  No rash, sores, jaundice, itching, dryness Cardiovascular  No chest pain, shortness of breath, or edema  Pulmonary  No cough or wheeze.  Gastro Intestinal  No nausea, vomitting, or diarrhoea. No bright red  blood per rectum, no abdominal pain, change in bowel movement, or constipation.  Genito Urinary  No frequency, urgency, dysuria, no bleeding Musculo Skeletal  No myalgia, arthralgia, joint swelling or pain  Neurologic  No weakness, numbness, change in gait,  Psychology  No depression, anxiety, insomnia.   Vitals:  Blood pressure (!) 157/61, pulse 64, temperature 98 F (36.7 C), temperature source Oral, resp. rate 20, height 5' 4.5" (1.638 m), weight 122 lb 14.4 oz (55.7 kg), SpO2 100 %.  Physical Exam: WD  in NAD Neck  Supple NROM, without any enlargements.  Lymph Node Survey No cervical supraclavicular or inguinal adenopathy Cardiovascular  Pulse normal rate, regularity and rhythm. S1 and S2 normal.  Lungs  Clear to auscultation bilateraly, without wheezes/crackles/rhonchi. Good air movement.  Skin  No rash/lesions/breakdown  Psychiatry  Alert and oriented to person, place, and time  Abdomen  Normoactive bowel sounds, abdomen soft, non-tender and thin without evidence of hernia. Incisions: soft. Nontender. Back No CVA tenderness Genito Urinary: normal external genitalia, well healed vaginal cuff, no lesions or masses palpable. Rectal  deferred Extremities  No bilateral cyanosis, clubbing or edema.  [name], MD  08/06/2017, 1:40 PM

## 2017-08-13 ENCOUNTER — Ambulatory Visit: Payer: Medicare Other | Admitting: Gynecologic Oncology

## 2017-10-09 ENCOUNTER — Encounter: Payer: Self-pay | Admitting: *Deleted

## 2017-10-12 ENCOUNTER — Telehealth: Payer: Self-pay | Admitting: Family Medicine

## 2017-10-12 ENCOUNTER — Ambulatory Visit: Payer: Medicare Other | Admitting: Family Medicine

## 2017-10-12 MED ORDER — TELMISARTAN-HCTZ 80-12.5 MG PO TABS: 1.0000 | ORAL_TABLET | Freq: Every day | ORAL | 1 refills | Status: DC

## 2017-10-12 NOTE — Telephone Encounter (Signed)
Left message on voicemail to call office. Please tell pt Rx was sent to pharmacy for her in [facility].

## 2017-10-12 NOTE — Telephone Encounter (Signed)
Reviewed chart. Last cpe 03/2017. I've refilled medication to [facility] in [place].  Please call and let her know. Please apologize for having to reschedule today.   Thanks.

## 2017-10-12 NOTE — Telephone Encounter (Signed)
Pt was scheduled to see Dr. Jonni Sanger on 10/12/17, however appt was cancelled due to Dr. Jonni Sanger not in office. Pt states that she lives in [place] and is leaving to go home on Saturday. Pt asking if Dr. Jonni Sanger would call in I refill for her BP meds (telmisartan)to [facility] [place] and she will call to reschedule this appt when she can get back to [place].

## 2017-10-15 NOTE — Telephone Encounter (Signed)
Left detailed message on personal voicemail on home number. Rx was sent to pharmacy [facility] in [place] wanted to make sure you got Rx. Please call office.

## 2017-11-29 ENCOUNTER — Ambulatory Visit: Payer: Medicare Other | Admitting: Family Medicine

## 2017-11-29 ENCOUNTER — Encounter: Payer: Self-pay | Admitting: Family Medicine

## 2017-11-29 ENCOUNTER — Other Ambulatory Visit: Payer: Self-pay

## 2017-11-29 VITALS — BP 112/60 | HR 70 | Temp 99.0°F | Ht 65.0 in | Wt 122.0 lb

## 2017-11-29 DIAGNOSIS — I10 Essential (primary) hypertension: Secondary | ICD-10-CM

## 2017-11-29 DIAGNOSIS — Z862 Personal history of diseases of the blood and blood-forming organs and certain disorders involving the immune mechanism: Secondary | ICD-10-CM

## 2017-11-29 DIAGNOSIS — N390 Urinary tract infection, site not specified: Secondary | ICD-10-CM

## 2017-11-29 DIAGNOSIS — Z8542 Personal history of malignant neoplasm of other parts of uterus: Secondary | ICD-10-CM | POA: Diagnosis not present

## 2017-11-29 DIAGNOSIS — M810 Age-related osteoporosis without current pathological fracture: Secondary | ICD-10-CM

## 2017-11-29 MED ORDER — CIPROFLOXACIN HCL 500 MG PO TABS: 500.0000 mg | ORAL_TABLET | Freq: Every day | ORAL | 2 refills | Status: DC | PRN

## 2017-11-29 NOTE — Progress Notes (Signed)
Subjective  CC:  Chief Complaint  Patient presents with  . Establish Care    Transfer from [place]    HPI: Gloria Cummings is a 77 y.o. female who presents to [place] at [facility] today to establish care with me as a new patient. She is a former [facility] patient and is here to reestablish care with me today.   She has the following concerns or needs:   Has moved to [facility] and living with significant other. Doing well; however having problems with urinary symptoms.  Since her vaginal hysterectomy for endometrial cancer, she has had more problems with urination, described as difficulty voiding at times.  She does have recurrent UTIs that can be related to intercourse.  She is had no recent symptoms of UTI.  No blood in the urine, no urgency symptoms.  She does have occasional stress incontinence symptoms.  Hypertension follow-up: Doing well on medications.  Blood pressure has been well controlled.  No symptoms of headaches, CHF or angina.  Tolerates medicines without difficulty.  She is due for blood work in June, declines today.  Grieving: Unfortunately, 1 of her dear friends passed away last week.  She was able to attend the family's memorial service.  Grief is appropriate  We updated and reviewed the patient's past history in detail and it is documented below.  Patient Active Problem List   Diagnosis Date Noted  . Genetic testing 04/12/2017  . History of breast cancer 03/12/2017  . Family history of breast cancer   . Family history of prostate cancer   . Family history of colon cancer   . History of endometrial cancer 02/02/2017  . Colon polyp 01/14/2013  . 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 Recurrent UTI 01/14/2013  . Benign essential hypertension 01/20/2011  . Osteoarthritis, multiple sites 10/25/2010  . Osteoporosis 10/25/2010   Health Maintenance  Topic Date Due  . INFLUENZA VACCINE  08/01/2018 (Originally 05/02/2017)  . MAMMOGRAM   06/21/2018  . TETANUS/TDAP  01/30/2026  . PNA vac Low Risk Adult  Completed  . DEXA SCAN  Discontinued   Immunization History  Administered Date(s) Administered  . Influenza Split 09/03/2008, 07/25/2010  . Influenza, High Dose Seasonal PF 09/19/2014, 09/19/2014, 08/16/2015, 08/16/2015, 08/17/2016  . Influenza, Seasonal, Injecte, Preservative Fre 07/29/2014  . Influenza,trivalent, recombinat, inj, PF 07/24/2011  . Pneumococcal Conjugate-13 07/29/2014, 09/19/2014  . Pneumococcal Polysaccharide-23 07/29/2005  . Tdap 10/25/2010, 01/31/2016  . Zoster 09/03/2008   Current Meds  Medication Sig  . aspirin EC 81 MG tablet Take 81 mg by mouth daily.  . calcium carbonate (TUMS EX) 750 MG chewable tablet Chew 1 tablet by mouth daily.   . Cholecalciferol (VITAMIN D) 2000 units tablet Take 2,000 Units by mouth daily.  . fish oil-omega-3 fatty acids 1000 MG capsule Take 4 g by mouth daily.   . Multiple Vitamins-Minerals (MULTIVITAMIN WITH MINERALS) tablet Take 1 tablet by mouth daily.  Marland Kitchen telmisartan-hydrochlorothiazide (MICARDIS HCT) 80-12.5 MG tablet Take 1 tablet by mouth daily.    Allergies: Patient is allergic to lubricants; betadine [povidone iodine]; and lisinopril. Past Medical History Patient  has a past medical history of Anemia, Arthritis, Cancer ([place]), Cataract, Dyspnea, Family history of adverse reaction to anesthesia, Family history of breast cancer, Family history of colon cancer, Family history of prostate cancer, Family history of stomach cancer, Heart murmur, History of colon polyps, Hypertension, Osteoporosis, and Pneumonia. Past Surgical History Patient  has a past surgical history that includes  Foot surgery; Tubal ligation; Colon surgery; Eye surgery; Fracture surgery; Wisdom tooth extraction; Breast lumpectomy; Robotic assisted lap vaginal hysterectomy (N/A, 02/08/2017); and Sentinel node biopsy (N/A, 02/08/2017). Family History: Patient family history includes Breast cancer in  her cousin and paternal aunt; Cancer in her paternal aunt and paternal grandfather; Heart disease in her brother, father, maternal grandfather, and maternal grandmother; Prostate cancer in her paternal uncle; Stomach cancer in her paternal uncle; Stroke in her father and mother. Social History:  Patient  reports that  has never smoked. she has never used smokeless tobacco. She reports that she does not drink alcohol or use drugs.  Review of Systems: Constitutional: negative for fever or malaise Ophthalmic: negative for photophobia, double vision or loss of vision Cardiovascular: negative for chest pain, dyspnea on exertion, or new LE swelling Respiratory: negative for SOB or persistent cough Gastrointestinal: negative for abdominal pain, change in bowel habits or melena Genitourinary: negative for dysuria or gross hematuria Musculoskeletal: negative for new gait disturbance or muscular weakness Integumentary: negative for new or persistent rashes Neurological: negative for TIA or stroke symptoms Psychiatric: negative for SI or delusions Allergic/Immunologic: negative for hives  Patient Care Team    Relationship Specialty Notifications Start End  Leamon Arnt, MD PCP - General Family Medicine  02/25/15     Objective  Vitals: BP 112/60   Pulse 70   Temp 99 F (37.2 C)   Ht 5\' 5"  (1.651 m)   Wt 122 lb (55.3 kg)   BMI 20.30 kg/m   General: Appears well Psych: Alert and oriented x3, affect is normal Cor regular rate and rhythm without murmur Respiratory: Clear to auscultation bilaterally Abdominal exam is benign, no suprapubic tenderness, no CVA tenderness Extremities without edema  Assessment  1. Benign essential hypertension   2. Recurrent UTI   3. History of endometrial cancer   4. Age-related osteoporosis without current pathological fracture   5. History of anemia      Plan   Hypertension follow-up: Well-controlled.  Continue current medications.  Will check lab work  at her next visit.  History of recurrent UTI: Recommend prophylaxis with Cipro after intercourse, voiding after intercourse and drinking plenty of water.  Possible stress urinary incontinence, symptoms are mild.  Reassured.  No further workup at this time.  She will follow-up if things worsen.  Osteoporosis: Again refuses further treatment or evaluation.  Continue calcium and vitamin D supplementation and regular activity and exercise  History of anemia: When last checked had normalized.  Will recheck and monitor over time at the next visit.  Follow up:  Return in about 6 months (around 05/29/2018) for complete physical.  Commons side effects, risks, benefits, and alternatives for medications and treatment plan prescribed today were discussed, and the patient expressed understanding of the given instructions. Patient is instructed to call or message via MyChart if he/she has any questions or concerns regarding our treatment plan. No barriers to understanding were identified. We discussed Red Flag symptoms and signs in detail. Patient expressed understanding regarding what to do in case of urgent or emergency type symptoms.   Medication list was reconciled, printed and provided to the patient in AVS. Patient instructions and summary information was reviewed with the patient as documented in the AVS. This note was prepared with assistance of Dragon voice recognition software. Occasional wrong-word or sound-a-like substitutions may have occurred due to the inherent limitations of voice recognition software  No orders of the defined types were placed in this encounter.  Meds ordered this encounter  Medications  . ciprofloxacin (CIPRO) 500 MG tablet    Sig: Take 1 tablet (500 mg total) by mouth daily as needed.    Dispense:  30 tablet    Refill:  2

## 2017-11-29 NOTE — Patient Instructions (Addendum)
It was so good seeing you again! Thank you for establishing with my new practice and allowing me to continue caring for you. It means a lot to me.  Please schedule a follow up appointment with me in 4-6 months for your complete physical with lab work. Please schedule Annual Wellness visit at that time as well.   Take your antibiotic as directed to help prevent urinary tract infection.

## 2018-04-16 NOTE — Progress Notes (Signed)
Subjective:   Gloria Cummings is a 77 y.o. female who presents for Medicare Annual (Subsequent) preventive examination.  Review of Systems:  No ROS.  Medicare Wellness Visit. Additional risk factors are reflected in the social history.  Cardiac Risk Factors include: advanced age (>4men, >5 women);hypertension;family history of premature cardiovascular disease   Sleep patterns: Sleeps well, up to void x 2.  Home Safety/Smoke Alarms: Feels safe in home. Smoke alarms in place.  Living environment; residence and Firearm Safety: Lives with boyfriend in 1 story home with loft (uses rail).  Seat Belt Safety/Bike Helmet: Wears seat belt.   Female:   Pap-N/A       Mammo-09//10/2016, normal. [facility]. Will schedule for 06/2018      Dexa scan-pt reports osteoporosis. Declines testing (does not plan to take meds) [facility]-Colonoscopy 05/12/2015, pt reports normal. ([facility])      Objective:     Vitals: BP 128/70 (BP Location: Left Arm, Patient Position: Sitting, Cuff Size: Normal)   Pulse 65   Ht 5\' 5"  (1.651 m)   Wt 127 lb 6 oz (57.8 kg)   SpO2 98%   BMI 21.20 kg/m   Body mass index is 21.2 kg/m.  Advanced Directives 04/17/2018 02/19/2017 02/08/2017 02/06/2017 08/26/2015  Does Patient Have a Medical Advance Directive? Yes Yes Yes Yes No  Type of Paramedic of [place];Living will [place];Living will [place];Living will [place];Living will -  Does patient want to make changes to medical advance directive? - - No - Patient declined - -  Copy of [place] in Chart? Yes Yes - Yes -    Tobacco Social History   Tobacco Use  Smoking Status Never Smoker  Smokeless Tobacco Never Used     Counseling given: Not Answered   Past Medical History:  Diagnosis Date  . Anemia   . Arthritis   . 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 Cataract   . Dyspnea    hx of  . Family history  of adverse reaction to anesthesia    Mother died following surgery stroke  . Family history of breast cancer   . Family history of colon cancer   . Family history of prostate cancer   . Family history of stomach cancer   . Heart murmur   . History of colon polyps   . Hypertension   . Osteoporosis   . Pneumonia    Past Surgical History:  Procedure Laterality Date  . BREAST LUMPECTOMY     left lumpectomy  . COLON SURGERY     procedures  . EYE SURGERY     implants on both eyes  . FOOT SURGERY    . FRACTURE SURGERY    . ROBOTIC ASSISTED LAP VAGINAL HYSTERECTOMY N/A 02/08/2017   Procedure: XI ROBOTIC ASSISTED LAPAROSCOPIC TOTAL HYSTERECTOMY, BILATERAL SALPINGOOPHORECTOMY;  Surgeon: Everitt Amber, MD;  Location: [facility];  Service: Gynecology;  Laterality: N/A;  . SENTINEL NODE BIOPSY N/A 02/08/2017   Procedure: SENTINEL NODE BIOPSY;  Surgeon: Everitt Amber, MD;  Location: [facility];  Service: Gynecology;  Laterality: N/A;  . TUBAL LIGATION    . WISDOM TOOTH EXTRACTION     Family History  Problem Relation Age of Onset  . Stroke Mother   . Heart disease Father   . Stroke Father   . Heart disease Brother   . Heart disease Maternal Grandmother   . Heart  disease Maternal Grandfather   . Cancer Paternal Grandfather        NOS  . Breast cancer Paternal Aunt        dx over 61  . Stomach cancer Paternal Uncle   . Prostate cancer Paternal Uncle   . Cancer Paternal Aunt        possible colon cancer  . Breast cancer Cousin        paternal first cousin dx over 47   Social History   Socioeconomic History  . Marital status: Divorced    Spouse name: Not on file  . Number of children: Not on file  . Years of education: Not on file  . Highest education level: Not on file  Occupational History  . Not on file  Social Needs  . Financial resource strain: Not on file  . Food insecurity:    Worry: Not on file    Inability: Not on file  . Transportation needs:    Medical: Not on file     Non-medical: Not on file  Tobacco Use  . Smoking status: Never Smoker  . Smokeless tobacco: Never Used  Substance and Sexual Activity  . Alcohol use: No  . Drug use: No  . Sexual activity: Not Currently  Lifestyle  . Physical activity:    Days per week: Not on file    Minutes per session: Not on file  . Stress: Not on file  Relationships  . Social connections:    Talks on phone: Not on file    Gets together: Not on file    Attends religious service: Not on file    Active member of club or organization: Not on file    Attends meetings of clubs or organizations: Not on file    Relationship status: Not on file  Other Topics Concern  . Not on file  Social History Narrative  . Not on file    Outpatient Encounter Medications as of 04/17/2018  Medication Sig  . aspirin EC 81 MG tablet Take 81 mg by mouth daily.  . calcium carbonate (TUMS EX) 750 MG chewable tablet Chew 1 tablet by mouth daily.   . Cholecalciferol (VITAMIN D) 2000 units tablet Take 2,000 Units by mouth daily.  . fish oil-omega-3 fatty acids 1000 MG capsule Take 4 g by mouth daily.   . Multiple Vitamins-Minerals (MULTIVITAMIN WITH MINERALS) tablet Take 1 tablet by mouth daily.  Marland Kitchen telmisartan-hydrochlorothiazide (MICARDIS HCT) 80-12.5 MG tablet Take 1 tablet by mouth daily.  . [DISCONTINUED] ciprofloxacin (CIPRO) 500 MG tablet Take 1 tablet (500 mg total) by mouth daily as needed.  . Zoster Vaccine [facility]) injection Inject 0.5 mLs into the muscle once for 1 dose.   No facility-administered encounter medications on file as of 04/17/2018.     Activities of Daily Living In your present state of health, do you have any difficulty performing the following activities: 04/17/2018 11/29/2017  Hearing? N N  Vision? N N  Difficulty concentrating or making decisions? N N  Walking or climbing stairs? N N  Dressing or bathing? N N  Doing errands, shopping? N N  Preparing Food and eating ? N -  Using the Toilet? N  -  In the past six months, have you accidently leaked urine? N -  Do you have problems with loss of bowel control? N -  Managing your Medications? N -  Managing your Finances? N -  Housekeeping or managing your Housekeeping? N -  Some recent data  might be hidden    Patient Care Team: Leamon Arnt, MD as PCP - General (Family Medicine) Royston Sinner, Colin Benton, MD as Consulting Physician (Obstetrics and Gynecology) Everitt Amber, MD as Consulting Physician (Gynecologic Oncology) Toribio Harbour, [place] as Referring Physician (Chiropractic Medicine) Monna Fam, MD as Consulting Physician (Ophthalmology) Augustina Mood, DDS as Referring Physician (Dentistry) Rolm Bookbinder, MD as Consulting Physician (Dermatology)    Assessment:   This is a routine wellness examination for [facility].  Exercise Activities and Dietary recommendations Current Exercise Habits: Structured exercise class(dancing), Type of exercise: Other - see comments(dancing; maintaining "chapel cabins"), Time (Minutes): 30, Frequency (Times/Week): 7, Weekly Exercise (Minutes/Week): 210, Exercise limited by: None identified   Diet (meal preparation, eat out, water intake, caffeinated beverages, dairy products, fruits and vegetables): Drinks water.   Breakfast: Coffee Lunch: sandwich; fruit and vegetables Dinner: vegetables; chicken; salads  Goals    . Patient Stated     Continue to be active.        Fall Risk Fall Risk  04/17/2018 11/29/2017 01/31/2016  Falls in the past year? No No Yes    Depression Screen PHQ 2/9 Scores 04/17/2018 11/29/2017 11/29/2017 01/31/2016  PHQ - 2 Score 0 0 0 0  PHQ- 9 Score - - 0 -     Cognitive Function MMSE - Mini Mental State Exam 04/17/2018  Orientation to time 5  Orientation to Place 5  Registration 3  Attention/ Calculation 5  Recall 3  Language- name 2 objects 2  Language- repeat 1  Language- follow 3 step command 3  Language- read & follow direction 1  Write a sentence 1  Copy  design 1  Total score 30        Immunization History  Administered Date(s) Administered  . Influenza Split 09/03/2008, 07/25/2010  . Influenza, High Dose Seasonal PF 09/19/2014, 09/19/2014, 08/16/2015, 08/16/2015, 08/17/2016  . Influenza, Seasonal, Injecte, Preservative Fre 07/29/2014  . Influenza,trivalent, recombinat, inj, PF 07/24/2011  . Pneumococcal Conjugate-13 07/29/2014, 09/19/2014  . Pneumococcal Polysaccharide-23 07/29/2005  . Tdap 10/25/2010, 01/31/2016  . Zoster 09/03/2008    Screening Tests Health Maintenance  Topic Date Due  . INFLUENZA VACCINE  08/01/2018 (Originally 05/02/2018)  . MAMMOGRAM  06/21/2018  . TETANUS/TDAP  01/30/2026  . PNA vac Low Risk Adult  Completed  . DEXA SCAN  Discontinued        Plan:     Schedule mammogram.   Shingles vaccine at pharmacy.   Continue doing brain stimulating activities (puzzles, reading, adult coloring books, staying active) to keep memory sharp.    I have personally reviewed and noted the following in the patient's chart:   . Medical and social history . Use of alcohol, tobacco or illicit drugs  . Current medications and supplements . Functional ability and status . Nutritional status . Physical activity . Advanced directives . List of other physicians . Hospitalizations, surgeries, and ER visits in previous 12 months . Vitals . Screenings to include cognitive, depression, and falls . Referrals and appointments  In addition, I have reviewed and discussed with patient certain preventive protocols, quality metrics, and best practice recommendations. A written personalized care plan for preventive services as well as general preventive health recommendations were provided to patient.     Gerilyn Nestle, RN  04/17/2018

## 2018-04-17 ENCOUNTER — Other Ambulatory Visit: Payer: Self-pay

## 2018-04-17 ENCOUNTER — Ambulatory Visit (INDEPENDENT_AMBULATORY_CARE_PROVIDER_SITE_OTHER): Payer: Medicare Other

## 2018-04-17 VITALS — BP 128/70 | HR 65 | Ht 65.0 in | Wt 127.4 lb

## 2018-04-17 DIAGNOSIS — Z23 Encounter for immunization: Secondary | ICD-10-CM

## 2018-04-17 DIAGNOSIS — Z0000 Encounter for general adult medical examination without abnormal findings: Secondary | ICD-10-CM | POA: Diagnosis not present

## 2018-04-17 MED ORDER — ZOSTER VAC RECOMB ADJUVANTED 50 MCG/0.5ML IM SUSR: 0.5000 mL | Freq: Once | INTRAMUSCULAR | 1 refills | Status: AC

## 2018-04-17 NOTE — Progress Notes (Signed)
RN [place] note reviewed in PCP absence. Follow-up with PCP as scheduled.  Leeanne Rio, PA-C

## 2018-04-17 NOTE — Patient Instructions (Addendum)
Schedule mammogram.   Shingles vaccine at pharmacy.   Continue doing brain stimulating activities (puzzles, reading, adult coloring books, staying active) to keep memory sharp.   Health Maintenance, Female Adopting a healthy lifestyle and getting preventive care can go a long way to promote health and wellness. Talk with your health care provider about what schedule of regular examinations is right for you. This is a good chance for you to check in with your provider about disease prevention and staying healthy. In between checkups, there are plenty of things you can do on your own. Experts have done a lot of research about which lifestyle changes and preventive measures are most likely to keep you healthy. Ask your health care provider for more information. Weight and diet Eat a healthy diet  Be sure to include plenty of vegetables, fruits, low-fat dairy products, and lean protein.  Do not eat a lot of foods high in solid fats, added sugars, or salt.  Get regular exercise. This is one of the most important things you can do for your health. ? Most adults should exercise for at least 150 minutes each week. The exercise should increase your heart rate and make you sweat (moderate-intensity exercise). ? Most adults should also do strengthening exercises at least twice a week. This is in addition to the moderate-intensity exercise.  Maintain a healthy weight  Body mass index (BMI) is a measurement that can be used to identify possible weight problems. It estimates body fat based on height and weight. Your health care provider can help determine your BMI and help you achieve or maintain a healthy weight.  For females 20 years of age and older: ? A BMI below 18.5 is considered underweight. ? A BMI of 18.5 to 24.9 is normal. ? A BMI of 25 to 29.9 is considered overweight. ? A BMI of 30 and above is considered obese.  Watch levels of cholesterol and blood lipids  You should start having your  blood tested for lipids and cholesterol at 79 years of age, then have this test every 5 years.  You may need to have your cholesterol levels checked more often if: ? Your lipid or cholesterol levels are high. ? You are older than 79 years of age. ? You are at high risk for heart disease.  Cancer screening Lung Cancer  Lung cancer screening is recommended for adults 55-80 years old who are at high risk for lung cancer because of a history of smoking.  A yearly low-dose CT scan of the lungs is recommended for people who: ? Currently smoke. ? Have quit within the past 15 years. ? Have at least a 30-pack-year history of smoking. A pack year is smoking an average of one pack of cigarettes a day for 1 year.  Yearly screening should continue until it has been 15 years since you quit.  Yearly screening should stop if you develop a health problem that would prevent you from having lung cancer treatment.  Breast Cancer  Practice breast self-awareness. This means understanding how your breasts normally appear and feel.  It also means doing regular breast self-exams. Let your health care provider know about any changes, no matter how small.  If you are in your 20s or 30s, you should have a clinical breast exam (CBE) by a health care provider every 1-3 years as part of a regular health exam.  If you are 40 or older, have a CBE every year. Also consider having a breast X-ray (mammogram)   every year.  If you have a family history of breast cancer, talk to your health care provider about genetic screening.  If you are at high risk for breast cancer, talk to your health care provider about having an MRI and a mammogram every year.  Breast cancer gene (BRCA) assessment is recommended for women who have family members with BRCA-related cancers. BRCA-related cancers include: ? Breast. ? Ovarian. ? Tubal. ? Peritoneal cancers.  Results of the assessment will determine the need for genetic  counseling and BRCA1 and BRCA2 testing.  Cervical Cancer Your health care provider may recommend that you be screened regularly for cancer of the pelvic organs (ovaries, uterus, and vagina). This screening involves a pelvic examination, including checking for microscopic changes to the surface of your cervix (Pap test). You may be encouraged to have this screening done every 3 years, beginning at age 21.  For women ages 30-65, health care providers may recommend pelvic exams and Pap testing every 3 years, or they may recommend the Pap and pelvic exam, combined with testing for human papilloma virus (HPV), every 5 years. Some types of HPV increase your risk of cervical cancer. Testing for HPV may also be done on women of any age with unclear Pap test results.  Other health care providers may not recommend any screening for nonpregnant women who are considered low risk for pelvic cancer and who do not have symptoms. Ask your health care provider if a screening pelvic exam is right for you.  If you have had past treatment for cervical cancer or a condition that could lead to cancer, you need Pap tests and screening for cancer for at least 20 years after your treatment. If Pap tests have been discontinued, your risk factors (such as having a new sexual partner) need to be reassessed to determine if screening should resume. Some women have medical problems that increase the chance of getting cervical cancer. In these cases, your health care provider may recommend more frequent screening and Pap tests.  Colorectal Cancer  This type of cancer can be detected and often prevented.  Routine colorectal cancer screening usually begins at 79 years of age and continues through 79 years of age.  Your health care provider may recommend screening at an earlier age if you have risk factors for colon cancer.  Your health care provider may also recommend using home test kits to check for hidden blood in the  stool.  A small camera at the end of a tube can be used to examine your colon directly (sigmoidoscopy or colonoscopy). This is done to check for the earliest forms of colorectal cancer.  Routine screening usually begins at age 50.  Direct examination of the colon should be repeated every 5-10 years through 79 years of age. However, you may need to be screened more often if early forms of precancerous polyps or small growths are found.  Skin Cancer  Check your skin from head to toe regularly.  Tell your health care provider about any new moles or changes in moles, especially if there is a change in a mole's shape or color.  Also tell your health care provider if you have a mole that is larger than the size of a pencil eraser.  Always use sunscreen. Apply sunscreen liberally and repeatedly throughout the day.  Protect yourself by wearing long sleeves, pants, a wide-brimmed hat, and sunglasses whenever you are outside.  Heart disease, diabetes, and high blood pressure  High blood pressure causes   heart disease and increases the risk of stroke. High blood pressure is more likely to develop in: ? People who have blood pressure in the high end of the normal range (130-139/85-89 mm Hg). ? People who are overweight or obese. ? People who are African American.  If you are 18-39 years of age, have your blood pressure checked every 3-5 years. If you are 40 years of age or older, have your blood pressure checked every year. You should have your blood pressure measured twice-once when you are at a hospital or clinic, and once when you are not at a hospital or clinic. Record the average of the two measurements. To check your blood pressure when you are not at a hospital or clinic, you can use: ? An automated blood pressure machine at a pharmacy. ? A home blood pressure monitor.  If you are between 55 years and 79 years old, ask your health care provider if you should take aspirin to prevent  strokes.  Have regular diabetes screenings. This involves taking a blood sample to check your fasting blood sugar level. ? If you are at a normal weight and have a low risk for diabetes, have this test once every three years after 79 years of age. ? If you are overweight and have a high risk for diabetes, consider being tested at a younger age or more often. Preventing infection Hepatitis B  If you have a higher risk for hepatitis B, you should be screened for this virus. You are considered at high risk for hepatitis B if: ? You were born in a country where hepatitis B is common. Ask your health care provider which countries are considered high risk. ? Your parents were born in a high-risk country, and you have not been immunized against hepatitis B (hepatitis B vaccine). ? You have HIV or AIDS. ? You use needles to inject street drugs. ? You live with someone who has hepatitis B. ? You have had sex with someone who has hepatitis B. ? You get hemodialysis treatment. ? You take certain medicines for conditions, including cancer, organ transplantation, and autoimmune conditions.  Hepatitis C  Blood testing is recommended for: ? Everyone born from 1945 through 1965. ? Anyone with known risk factors for hepatitis C.  Sexually transmitted infections (STIs)  You should be screened for sexually transmitted infections (STIs) including gonorrhea and chlamydia if: ? You are sexually active and are younger than 79 years of age. ? You are older than 79 years of age and your health care provider tells you that you are at risk for this type of infection. ? Your sexual activity has changed since you were last screened and you are at an increased risk for chlamydia or gonorrhea. Ask your health care provider if you are at risk.  If you do not have HIV, but are at risk, it may be recommended that you take a prescription medicine daily to prevent HIV infection. This is called pre-exposure prophylaxis  (PrEP). You are considered at risk if: ? You are sexually active and do not regularly use condoms or know the HIV status of your partner(s). ? You take drugs by injection. ? You are sexually active with a partner who has HIV.  Talk with your health care provider about whether you are at high risk of being infected with HIV. If you choose to begin PrEP, you should first be tested for HIV. You should then be tested every 3 months for as long as   as you are taking PrEP. Pregnancy  If you are premenopausal and you may become pregnant, ask your health care provider about preconception counseling.  If you may become pregnant, take 400 to 800 micrograms (mcg) of folic acid every day.  If you want to prevent pregnancy, talk to your health care provider about birth control (contraception). Osteoporosis and menopause  Osteoporosis is a disease in which the bones lose minerals and strength with aging. This can result in serious bone fractures. Your risk for osteoporosis can be identified using a bone density scan.  If you are 109 years of age or older, or if you are at risk for osteoporosis and fractures, ask your health care provider if you should be screened.  Ask your health care provider whether you should take a calcium or vitamin D supplement to lower your risk for osteoporosis.  Menopause may have certain physical symptoms and risks.  Hormone replacement therapy may reduce some of these symptoms and risks. Talk to your health care provider about whether hormone replacement therapy is right for you. Follow these instructions at home:  Schedule regular health, dental, and eye exams.  Stay current with your immunizations.  Do not use any tobacco products including cigarettes, chewing tobacco, or electronic cigarettes.  If you are pregnant, do not drink alcohol.  If you are breastfeeding, limit how much and how often you drink alcohol.  Limit alcohol intake to no more than 1 drink per day for  nonpregnant women. One drink equals 12 ounces of beer, 5 ounces of wine, or 1 ounces of hard liquor.  Do not use street drugs.  Do not share needles.  Ask your health care provider for help if you need support or information about quitting drugs.  Tell your health care provider if you often feel depressed.  Tell your health care provider if you have ever been abused or do not feel safe at home. This information is not intended to replace advice given to you by your health care provider. Make sure you discuss any questions you have with your health care provider. Document Released: 04/03/2011 Document Revised: 02/24/2016 Document Reviewed: 06/22/2015 Elsevier Interactive Patient Education  [employer].

## 2018-05-23 ENCOUNTER — Other Ambulatory Visit: Payer: Self-pay | Admitting: Emergency Medicine

## 2018-05-23 ENCOUNTER — Encounter: Payer: Medicare Other | Admitting: Family Medicine

## 2018-05-23 MED ORDER — TELMISARTAN-HCTZ 80-12.5 MG PO TABS: 1.0000 | ORAL_TABLET | Freq: Every day | ORAL | 0 refills | Status: DC

## 2018-05-23 MED ORDER — TELMISARTAN-HCTZ 80-12.5 MG PO TABS: 1.0000 | ORAL_TABLET | Freq: Every day | ORAL | 1 refills | Status: DC

## 2018-05-30 ENCOUNTER — Encounter: Payer: Medicare Other | Admitting: Family Medicine

## 2018-05-30 ENCOUNTER — Ambulatory Visit: Payer: Medicare Other

## 2018-07-01 ENCOUNTER — Telehealth: Payer: Self-pay | Admitting: Emergency Medicine

## 2018-07-01 NOTE — Telephone Encounter (Signed)
Copied from [place] [id number]. Topic: Quick Communication - See Telephone Encounter >> Jul 01, 2018  4:13 PM Percell Belt A wrote: CRM for notification. See Telephone encounter for: 07/01/18.  Pt called in and would like to speak with amy about her appt next week.  She would not give me any other info and wanted to talk directly to amy.    Best call back number [phone]

## 2018-07-01 NOTE — Telephone Encounter (Signed)
Patient called and states that she has gotten a doctor in [place], [place] because she feels like it is best for her and her health.  Doloris Hall,  LPN

## 2018-07-10 ENCOUNTER — Encounter: Payer: Medicare Other | Admitting: Family Medicine

## 2018-09-02 ENCOUNTER — Telehealth: Payer: Self-pay

## 2018-09-02 NOTE — Telephone Encounter (Signed)
Incoming call from pt confirming her appt for this Friday Dec 6th at noon.  Reminded to arrive 15 min early and valet parking as option. No other needs per pt at this time.

## 2018-09-06 ENCOUNTER — Encounter: Payer: Self-pay | Admitting: Gynecologic Oncology

## 2018-09-06 ENCOUNTER — Inpatient Hospital Stay: Payer: Medicare Other | Attending: Gynecologic Oncology | Admitting: Gynecologic Oncology

## 2018-09-06 VITALS — BP 142/72 | HR 75 | Temp 98.0°F | Resp 20 | Ht 65.0 in | Wt 123.2 lb

## 2018-09-06 DIAGNOSIS — C541 Malignant neoplasm of endometrium: Secondary | ICD-10-CM | POA: Diagnosis not present

## 2018-09-06 DIAGNOSIS — Z90710 Acquired absence of both cervix and uterus: Secondary | ICD-10-CM | POA: Diagnosis not present

## 2018-09-06 DIAGNOSIS — Z90722 Acquired absence of ovaries, bilateral: Secondary | ICD-10-CM | POA: Insufficient documentation

## 2018-09-06 IMAGING — CR DG CHEST 2V
2 series · 2 of 2 positions shown · non-contrast
Comparison: 11/11/2011

CLINICAL DATA: Endometrial cancer, preoperative evaluation for
hysterectomy. Hypertension.

EXAM:
CHEST  2 VIEW

[w chest pa]
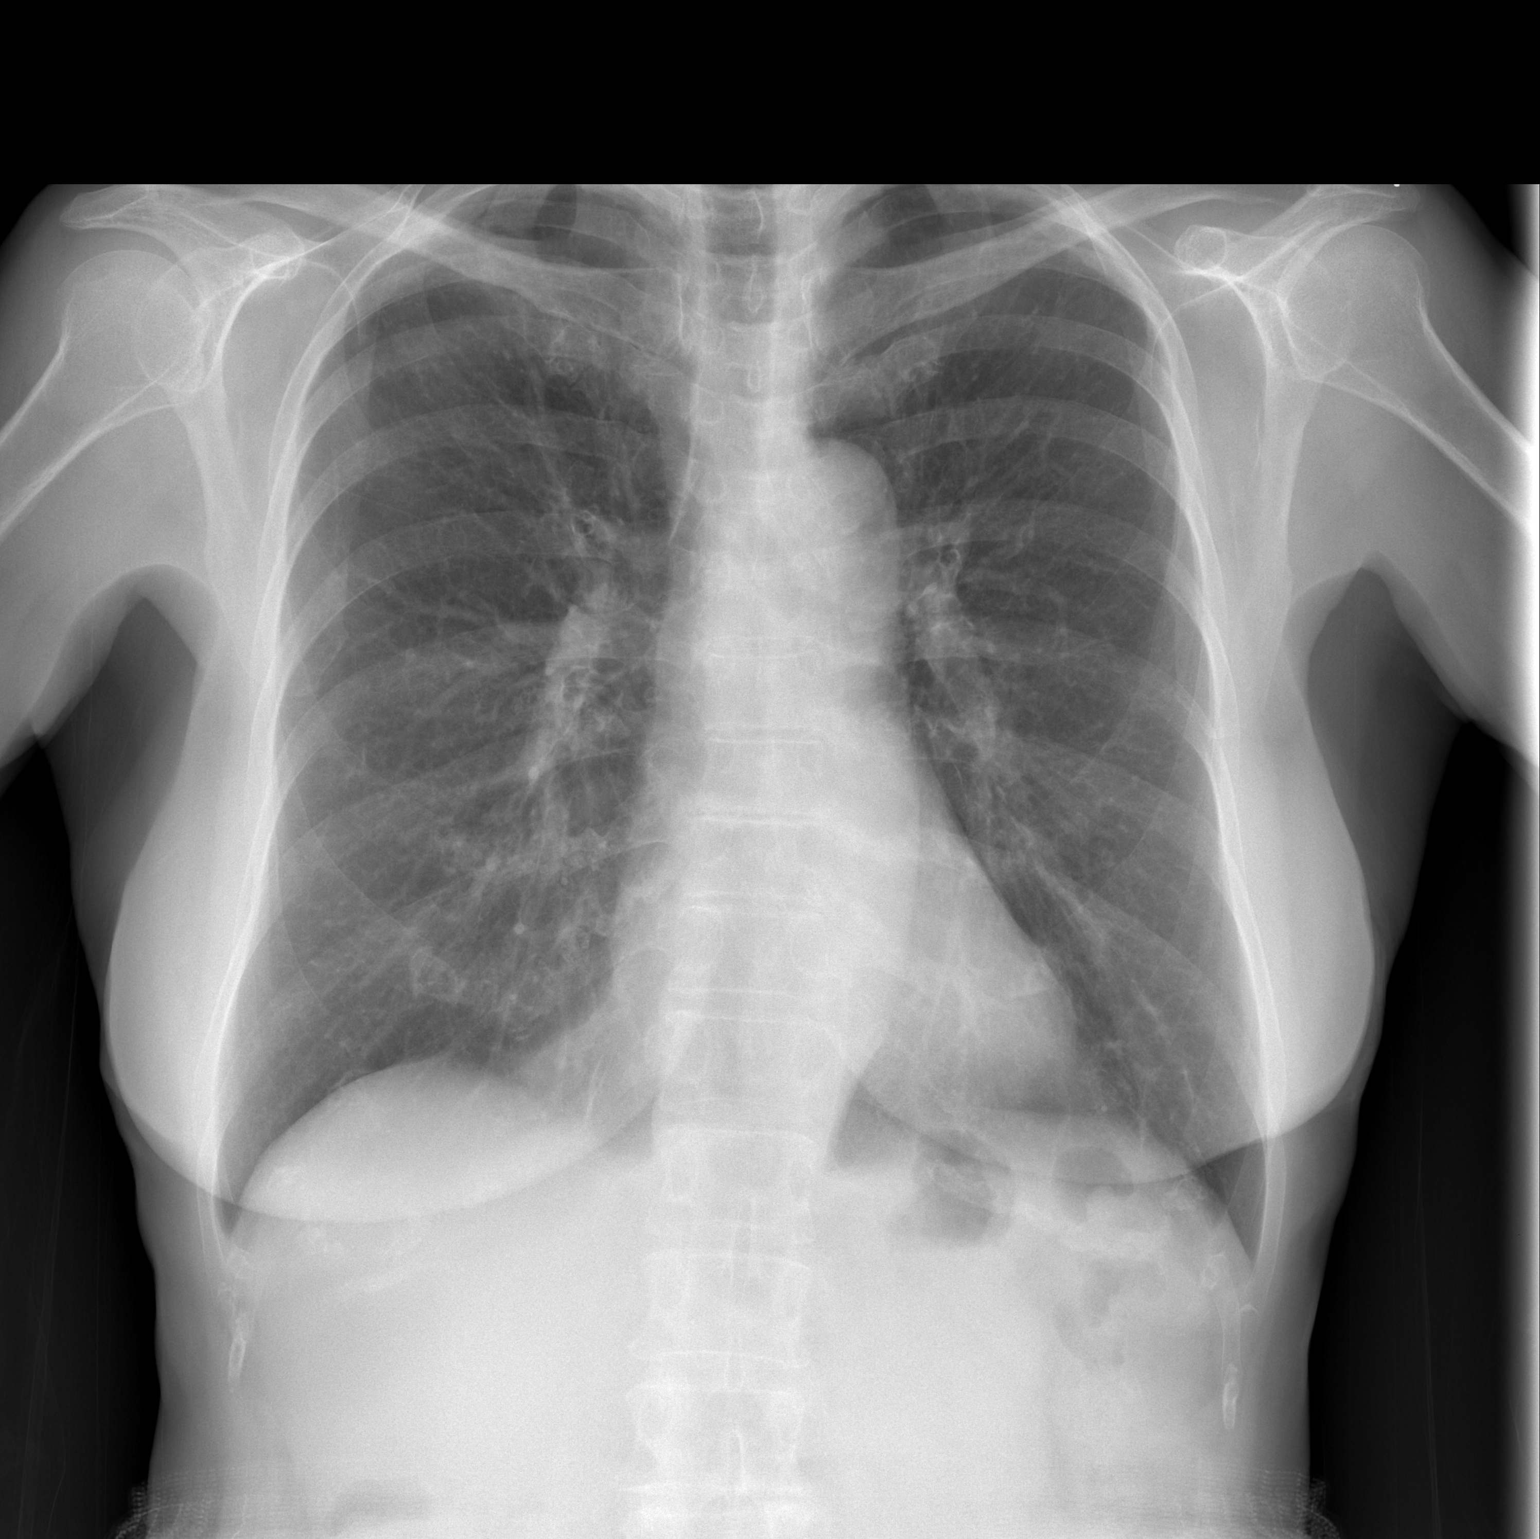

[w chest lat]
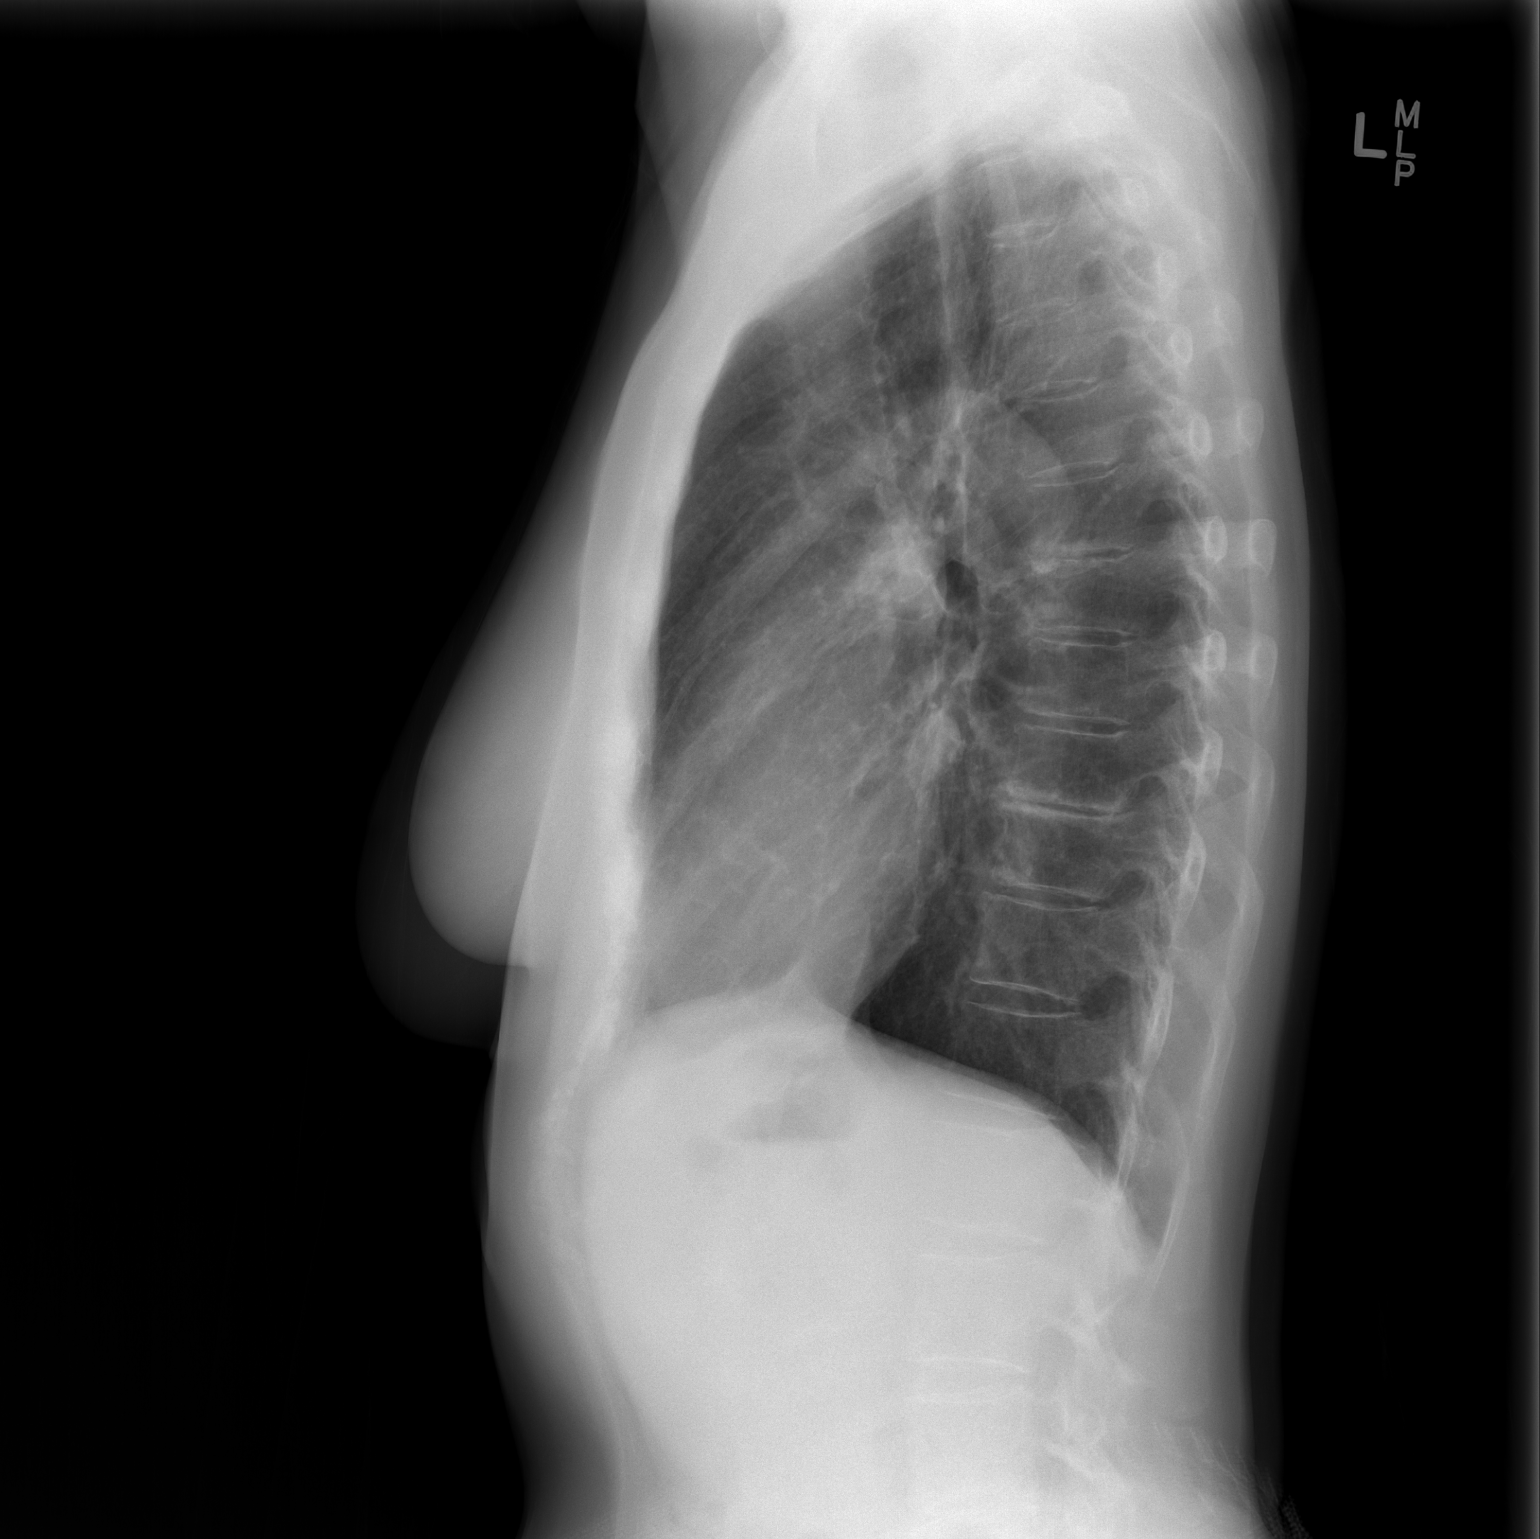

[2 of 2 positions shown; findings below may reference images not displayed]

FINDINGS: The heart size and mediastinal contours are within normal limits.
Both lungs are clear. The visualized skeletal structures are
unremarkable.
IMPRESSION: No active cardiopulmonary disease.

## 2018-09-06 NOTE — Patient Instructions (Signed)
Please notify your primary doctor in [place] or Dr Denman George at phone number [phone] if you notice vaginal bleeding, new pelvic or abdominal pains, bloating, feeling full easy, or a change in bladder or bowel function.   Please follow-up annually for a pelvic exam with a provider locally or with Dr Denman George at your choosing.

## 2018-09-06 NOTE — Progress Notes (Signed)
Follow-up Note: Gyn-Onc  Consult was requested by Dr. Royston Sinner for the evaluation of Gloria Cummings 77 y.o. female  CC:  Chief Complaint  Patient presents with  . Endometrial cancer [facility])    Assessment/Plan:  Gloria Cummings  is a 77 y.o.  year old with stage IA grade 1 endometrial cancer. 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 Negative testing for Lynch syndrome. Clinically free of disease.  Pathology revealed low risk factors for recurrence, therefore no adjuvant therapy was recommended according to NCCN guidelines.  I recommend she have follow-up every 6 months for 5 years in accordance with NCCN guidelines. Those visits should include symptom assessment, physical exam and pelvic examination. Pap smears are not indicated or recommended in the routine surveillance of endometrial cancer.  She has moved to [place] in [place] and will therefore follow-up locally there.   HPI: Gloria Cummings is a 77 year old P2 who is seen in consultation at the request of Dr Royston Sinner for grade 1 endometrial cancer.  The patient reports postmenopausal bleeding since March, 2018.  She was seen by Dr Royston Sinner for this on 01/09/17 and a TVUS was performed which showed a uterus measuring 6.8x3.2x3.8cm with an endometrium of 1.8cm The left ovary measuringed 2.9cm with a 1.2cm ovarian cyst. The right ovary was not seen.  On 01/17/17 an endometrial biopsy was performed which revealed grade 1 endometrial cancer.   She continues to have persistent light bleeding.  She has an allergy to water based lubricant and betadine.  Interval Hx:  On 02/08/17 she underwent robotic assisted total hysterectomy, BSO, SLN biopsy. Surgery was complicated by a postop bleed superficially from the left lateral incision that required additional sutures be placed. Postop she has done very well with only light vaginal spotting for 5 days (beginning 1 week after surgery).  Final pathology revealed a 3.7cm grade 1 tumor  0.1 of 0.5cm myometrial invasion (inner half) and no LVSI. There was no metastatic disease in the adnexa, cervix or lymph nodes. Immunohistochemistry revealed loss of nuclear expression of  MLH1 and PMS2. Lynch syndrome testing was negative.  She has done well since surgery with no complaints and no vaginal bleeding.  Current Meds:  Outpatient Encounter Medications as of 09/06/2018  Medication Sig  . aspirin EC 81 MG tablet Take 81 mg by mouth daily.  . calcium carbonate (TUMS EX) 750 MG chewable tablet Chew 1 tablet by mouth daily.   . Cholecalciferol (VITAMIN D) 2000 units tablet Take 2,000 Units by mouth daily.  . fish oil-omega-3 fatty acids 1000 MG capsule Take 4 g by mouth daily.   . Multiple Vitamins-Minerals (MULTIVITAMIN WITH MINERALS) tablet Take 1 tablet by mouth daily.  Marland Kitchen telmisartan-hydrochlorothiazide (MICARDIS HCT) 80-12.5 MG tablet Take 1 tablet by mouth daily.   No facility-administered encounter medications on file as of 09/06/2018.     Allergy:  Allergies  Allergen Reactions  . Lubricants     Water based lubricant  Unspecified dermatitis  . Betadine [Povidone Iodine] Swelling  . Lisinopril Cough    Social Hx:   Social History   Socioeconomic History  . Marital status: Divorced    Spouse name: Not on file  . Number of children: Not on file  . Years of education: Not on file  . Highest education level: Not on file  Occupational History  . Not on file  Social Needs  . Financial resource strain: Not on file  . Food insecurity:  Worry: Not on file    Inability: Not on file  . Transportation needs:    Medical: Not on file    Non-medical: Not on file  Tobacco Use  . Smoking status: Never Smoker  . Smokeless tobacco: Never Used  Substance and Sexual Activity  . Alcohol use: No  . Drug use: No  . Sexual activity: Not Currently  Lifestyle  . Physical activity:    Days per week: Not on file    Minutes per session: Not on file  . Stress: Not on file   Relationships  . Social connections:    Talks on phone: Not on file    Gets together: Not on file    Attends religious service: Not on file    Active member of club or organization: Not on file    Attends meetings of clubs or organizations: Not on file    Relationship status: Not on file  . Intimate partner violence:    Fear of current or ex partner: Not on file    Emotionally abused: Not on file    Physically abused: Not on file    Forced sexual activity: Not on file  Other Topics Concern  . Not on file  Social History Narrative  . Not on file    Past Surgical Hx:  Past Surgical History:  Procedure Laterality Date  . BREAST LUMPECTOMY     left lumpectomy  . COLON SURGERY     procedures  . EYE SURGERY     implants on both eyes  . FOOT SURGERY    . FRACTURE SURGERY    . ROBOTIC ASSISTED LAP VAGINAL HYSTERECTOMY N/A 02/08/2017   Procedure: XI ROBOTIC ASSISTED LAPAROSCOPIC TOTAL HYSTERECTOMY, BILATERAL SALPINGOOPHORECTOMY;  Surgeon: Everitt Amber, MD;  Location: [facility];  Service: Gynecology;  Laterality: N/A;  . SENTINEL NODE BIOPSY N/A 02/08/2017   Procedure: SENTINEL NODE BIOPSY;  Surgeon: Everitt Amber, MD;  Location: [facility];  Service: Gynecology;  Laterality: N/A;  . TUBAL LIGATION    . WISDOM TOOTH EXTRACTION      Past Medical Hx:  Past Medical History:  Diagnosis Date  . Anemia   . Arthritis   . Cancer ([place])    endo metrial   left breast  . Cataract   . Dyspnea    hx of  . Family history of adverse reaction to anesthesia    Mother died following surgery stroke  . Family history of breast cancer   . Family history of colon cancer   . Family history of prostate cancer   . Family history of stomach cancer   . Heart murmur   . History of colon polyps   . Hypertension   . Occipital neuralgia   . Osteoporosis   . Pneumonia     Past Gynecological History:  SVD x 1 No LMP recorded. Patient is postmenopausal.  Family Hx:  Family History  Problem Relation Age of  Onset  . Stroke Mother   . Heart disease Father   . Stroke Father   . Heart disease Brother   . Heart disease Maternal Grandmother   . Heart disease Maternal Grandfather   . Cancer Paternal Grandfather        NOS  . Breast cancer Paternal Aunt        dx over 73  . Stomach cancer Paternal Uncle   . Prostate cancer Paternal Uncle   . Cancer Paternal Aunt        possible colon cancer  .  Breast cancer Cousin        paternal first cousin dx over 2    Review of Systems:  Constitutional  Feels well,    ENT Normal appearing ears and nares bilaterally Skin/Breast  No rash, sores, jaundice, itching, dryness Cardiovascular  No chest pain, shortness of breath, or edema  Pulmonary  No cough or wheeze.  Gastro Intestinal  No nausea, vomitting, or diarrhoea. No bright red blood per rectum, no abdominal pain, change in bowel movement, or constipation.  Genito Urinary  No frequency, urgency, dysuria, no bleeding Musculo Skeletal  No myalgia, arthralgia, joint swelling or pain  Neurologic  No weakness, numbness, change in gait,  Psychology  No depression, anxiety, insomnia.   Vitals:  Blood pressure (!) 142/72, pulse 75, temperature 98 F (36.7 C), temperature source Oral, resp. rate 20, height 5' 5"  (1.651 m), weight 123 lb 3.2 oz (55.9 kg), SpO2 98 %.  Physical Exam: WD in NAD Neck  Supple NROM, without any enlargements.  Lymph Node Survey No cervical supraclavicular or inguinal adenopathy Cardiovascular  Pulse normal rate, regularity and rhythm. S1 and S2 normal.  Lungs  Clear to auscultation bilateraly, without wheezes/crackles/rhonchi. Good air movement.  Skin  No rash/lesions/breakdown  Psychiatry  Alert and oriented to person, place, and time  Abdomen  Normoactive bowel sounds, abdomen soft, non-tender and thin without evidence of hernia. Incisions: soft. Nontender. Back No CVA tenderness Genito Urinary: normal external genitalia, well healed vaginal cuff, no  lesions or masses palpable. Rectal  deferred Extremities  No bilateral cyanosis, clubbing or edema.  Thereasa Solo, MD  09/06/2018, 1:20 PM

## 2022-02-02 LAB — COLOGUARD: COLOGUARD: NEGATIVE

## 2022-05-21 ENCOUNTER — Encounter (HOSPITAL_COMMUNITY): Payer: Self-pay | Admitting: Emergency Medicine

## 2022-05-21 ENCOUNTER — Ambulatory Visit (HOSPITAL_COMMUNITY)
Admission: EM | Admit: 2022-05-21 | Discharge: 2022-05-21 | Disposition: A | Discharge status: Home / Self Care | Payer: Medicare PPO | Attending: Internal Medicine | Admitting: Internal Medicine

## 2022-05-21 ENCOUNTER — Telehealth: Payer: Medicare Other

## 2022-05-21 DIAGNOSIS — R0981 Nasal congestion: Secondary | ICD-10-CM

## 2022-05-21 DIAGNOSIS — R509 Fever, unspecified: Secondary | ICD-10-CM

## 2022-05-21 DIAGNOSIS — U071 COVID-19: Secondary | ICD-10-CM

## 2022-05-21 MED ORDER — NIRMATRELVIR/RITONAVIR (PAXLOVID)TABLET: 3.0000 | ORAL_TABLET | Freq: Two times a day (BID) | ORAL | 0 refills | Status: AC

## 2022-05-21 MED ORDER — GUAIFENESIN ER 1200 MG PO TB12: 1200.0000 mg | ORAL_TABLET | Freq: Two times a day (BID) | ORAL | 0 refills | Status: DC

## 2022-05-21 MED ORDER — BENZONATATE 100 MG PO CAPS: 100.0000 mg | ORAL_CAPSULE | Freq: Three times a day (TID) | ORAL | 0 refills | Status: DC

## 2022-05-21 NOTE — ED Notes (Signed)
Denies taking any medications today for fevers or cough, congestion today.

## 2022-05-21 NOTE — ED Provider Notes (Signed)
[place]    CSN: [id number] Arrival date & time: 05/21/22  1709      History   Chief Complaint Chief Complaint  Patient presents with   Covid Positive   Fever    HPI Gloria Cummings is a 81 y.o. female.   Patient presents to urgent care for evaluation of fatigue, weakness, nasal congestion, and dry cough that started yesterday.  Patient took a home COVID test yesterday and it was positive.  She recently returned from a trip with her grandson who experienced symptoms and tested positive before she did.  Patient has had COVID in the past but has never had to be hospitalized related to COVID-19 virus.  She is fully vaccinated against COVID 19.  One of her cousins who is a physician recommended that she come to urgent care for a prescription of Paxlovid. Cough is dry and worse at night time. Reports chills at home, but does not own a thermometer and has not checked temperature at home. Patient denies chest pain, shortness of breath, difficulty breathing laying flat, leg swelling, nausea, vomiting, diarrhea, constipation, abdominal pain, ear pain, and wheezing. She is not a smoker and denies history of asthma. States she "does not usually take medications" and has not taken any tylenol, ibuprofen, or any other over-the-counter cold and flu medications prior to arrival at urgent care.    Fever   Past Medical History:  Diagnosis Date   Anemia    Arthritis    Cancer ([place])    endo metrial   left breast   Cataract    Dyspnea    hx of   Family history of adverse reaction to anesthesia    Mother died following surgery stroke   Family history of breast cancer    Family history of colon cancer    Family history of prostate cancer    Family history of stomach cancer    Heart murmur    History of colon polyps    Hypertension    Occipital neuralgia    Osteoporosis    Pneumonia     Patient Active Problem List   Diagnosis Date Noted   Genetic testing 04/12/2017    History of breast cancer 03/12/2017   Family history of breast cancer    Family history of prostate cancer    Family history of colon cancer    History of endometrial cancer 02/02/2017   Colon polyp 01/14/2013   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   Recurrent UTI 01/14/2013   Benign essential hypertension 01/20/2011   Osteoarthritis, multiple sites 10/25/2010   Osteoporosis 10/25/2010    Past Surgical History:  Procedure Laterality Date   BREAST LUMPECTOMY     left lumpectomy   COLON SURGERY     procedures   EYE SURGERY     implants on both eyes   FOOT SURGERY     FRACTURE SURGERY     ROBOTIC ASSISTED LAP VAGINAL HYSTERECTOMY N/A 02/08/2017   Procedure: XI ROBOTIC ASSISTED LAPAROSCOPIC TOTAL HYSTERECTOMY, BILATERAL SALPINGOOPHORECTOMY;  Surgeon: Everitt Amber, MD;  Location: [facility];  Service: Gynecology;  Laterality: N/A;   SENTINEL NODE BIOPSY N/A 02/08/2017   Procedure: SENTINEL NODE BIOPSY;  Surgeon: Everitt Amber, MD;  Location: [facility];  Service: Gynecology;  Laterality: N/A;   TUBAL LIGATION     WISDOM TOOTH EXTRACTION      OB History   No obstetric history on file.      Home Medications    Prior  to Admission medications   Medication Sig Start Date End Date Taking? Authorizing Provider  benzonatate (TESSALON) 100 MG capsule Take 1 capsule (100 mg total) by mouth every 8 (eight) hours. 05/21/22  Yes Talbot Grumbling, FNP  Guaifenesin 1200 MG TB12 Take 1 tablet (1,200 mg total) by mouth in the morning and at bedtime. 05/21/22  Yes Talbot Grumbling, FNP  nirmatrelvir/ritonavir EUA (PAXLOVID) 20 x 150 MG & 10 x '100MG'$  TABS Take 3 tablets by mouth 2 (two) times daily for 5 days. Take nirmatrelvir (150 mg) two tablets twice daily for 5 days and ritonavir (100 mg) one tablet twice daily for 5 days. 05/21/22 05/26/22 Yes Talbot Grumbling, FNP  aspirin EC 81 MG tablet Take 81 mg by mouth daily.    [provider]  calcium carbonate (TUMS EX) 750 MG  chewable tablet Chew 1 tablet by mouth daily.     [provider]  Cholecalciferol (VITAMIN D) 2000 units tablet Take 2,000 Units by mouth daily.    [provider]  fish oil-omega-3 fatty acids 1000 MG capsule Take 4 g by mouth daily.     [provider]  Multiple Vitamins-Minerals (MULTIVITAMIN WITH MINERALS) tablet Take 1 tablet by mouth daily.    [provider]  telmisartan-hydrochlorothiazide (MICARDIS HCT) 80-12.5 MG tablet Take 1 tablet by mouth daily. 05/23/18   Leamon Arnt, MD    Family History Family History  Problem Relation Age of Onset   Stroke Mother    Heart disease Father    Stroke Father    Heart disease Brother    Heart disease Maternal Grandmother    Heart disease Maternal Grandfather    Cancer Paternal Grandfather        NOS   Breast cancer Paternal Aunt        dx over 33   Stomach cancer Paternal Uncle    Prostate cancer Paternal Uncle    Cancer Paternal Aunt        possible colon cancer   Breast cancer Cousin        paternal first cousin dx over 13    Social History Social History   Tobacco Use   Smoking status: Never   Smokeless tobacco: Never  Vaping Use   Vaping Use: Never used  Substance Use Topics   Alcohol use: No   Drug use: No     Allergies   Lubricants, Betadine [povidone iodine], and Lisinopril   Review of Systems Review of Systems  Constitutional:  Positive for fever.   Per HPI  Physical Exam Triage Vital Signs ED Triage Vitals  Enc Vitals Group     BP 05/21/22 1805 137/62     Pulse Rate 05/21/22 1805 79     Resp 05/21/22 1805 17     Temp 05/21/22 1805 (!) 100.6 F (38.1 C)     Temp Source 05/21/22 1803 Oral     SpO2 05/21/22 1805 97 %     Weight --      Height --      Head Circumference --      Peak Flow --      Pain Score 05/21/22 1800 0     Pain Loc --      Pain Edu? --      Excl. in [place]? --    No data found.  Updated Vital Signs BP 137/62 (BP Location: Right Arm)    Pulse 79   Temp (!) 100.6 F (38.1 C)  Resp 17   SpO2 97%   Visual Acuity Right Eye Distance:   Left Eye Distance:   Bilateral Distance:    Right Eye Near:   Left Eye Near:    Bilateral Near:     Physical Exam Vitals and nursing note reviewed.  Constitutional:      Appearance: Normal appearance. She is not ill-appearing or toxic-appearing.     Comments: Very pleasant patient sitting on exam in position of comfort table in no acute distress.   HENT:     Head: Normocephalic and atraumatic.     Right Ear: Hearing, tympanic membrane, ear canal and external ear normal.     Left Ear: Hearing, tympanic membrane, ear canal and external ear normal.     Nose: Congestion present.     Mouth/Throat:     Lips: Pink.     Mouth: Mucous membranes are moist.     Pharynx: Posterior oropharyngeal erythema present. No oropharyngeal exudate.     Comments: Mild erythema to posterior oropharynx with small amount of clear postnasal drainage visualized. Airway intact and patent. Eyes:     General: Lids are normal. Vision grossly intact. Gaze aligned appropriately.        Right eye: No discharge.        Left eye: No discharge.     Extraocular Movements: Extraocular movements intact.     Conjunctiva/sclera: Conjunctivae normal.     Pupils: Pupils are equal, round, and reactive to light.  Cardiovascular:     Rate and Rhythm: Normal rate and regular rhythm.     Heart sounds: Normal heart sounds, S1 normal and S2 normal.  Pulmonary:     Effort: Pulmonary effort is normal. No respiratory distress.     Breath sounds: Normal air entry. Wheezing present.     Comments: Faint expiratory wheeze heard to the right lower lobe with auscultation.  Lung sounds clear to auscultation otherwise and patient is without respiratory distress.  Dry cough elicited with deep inspiration during lung exam. Abdominal:     General: Bowel sounds are normal.     Palpations: Abdomen is soft.     Tenderness: There is no  abdominal tenderness. There is no right CVA tenderness, left CVA tenderness or guarding.  Musculoskeletal:     Cervical back: Neck supple.     Right lower leg: No edema.     Left lower leg: No edema.  Lymphadenopathy:     Cervical: Cervical adenopathy present.  Skin:    General: Skin is warm and dry.     Capillary Refill: Capillary refill takes less than 2 seconds.     Findings: No rash.  Neurological:     General: No focal deficit present.     Mental Status: She is alert and oriented to person, place, and time. Mental status is at baseline.     Cranial Nerves: No dysarthria or facial asymmetry.     Motor: No weakness.     Gait: Gait is intact. Gait normal.     Comments: 5/5 power throughout.  No focal neurologic deficit identified to physical exam.  Psychiatric:        Mood and Affect: Mood normal.        Speech: Speech normal.        Behavior: Behavior normal.        Thought Content: Thought content normal.        Judgment: Judgment normal.      UC Treatments / Results  Labs (all labs  ordered are listed, but only abnormal results are displayed) Labs Reviewed - No data to display  EKG   Radiology No results found.  Procedures Procedures (including critical care time)  Medications Ordered in UC Medications - No data to display  Initial Impression / Assessment and Plan / UC Course  I have reviewed the triage vital signs and the nursing notes.  Pertinent labs & imaging results that were available during my care of the patient were reviewed by me and considered in my medical decision making (see chart for details).   1.  COVID-19 and nasal congestion Patient has COVID-19 viral upper respiratory tract infection based on positive home test.  She is ill-appearing in the clinic today, but overall in good spirits.  No acute respiratory distress.  Maintaining oxygen saturation at 97% on room air.  She is febrile at 100.6 but would like to take Tylenol or ibuprofen at home.   Offered dose of antipyretic at urgent care and patient declines.  Heart rate is stable at 79 with 17 respirations per minute.  Deferred imaging based on stable cardiopulmonary exam and hemodynamically stable vital signs at this time. Patient given incentive spirometer due to mild congestion/wheeze heard to auscultation of right lower lung field.  Advised patient to use incentive spirometer at home to maintain good lung function and prevent pneumonia related to COVID-19 viral illness since she has suffered from COVID-19 pneumonia in the past and is a former smoker.  She is a candidate for Paxlovid due to age and comorbidities.  GFR from December 16, 2021 (care everywhere) is 56 and creatinine 1.0 with BUN 23.  Paxlovid prescription sent to pharmacy. We will otherwise manage this with prescriptions for symptomatic relief.  Guaifenesin and Tessalon Perles prescribed to be taken as directed for nasal congestion and cough respectively. Patient may use over the counter tylenol 1,'000mg'$  every 6 hours and '400mg'$  ibuprofen every 6 hours with food as needed for fever, chills, headache, and other aches/pains associated with viral illness.  Advised PCP follow-up in the next 2 weeks for reevaluation to ensure improvement of symptoms and checkup. Patient is stable for outpatient treatment of COVID-19 at this time based on physical exam and vital signs, but has been given strict ED return precautions.  Patient is on day 2 of symptoms and has been advised to stay home until day 6 of symptoms. Patient may begin to go out in public on day 6 of symptoms, but must wear mask until day 11 and wash hands frequently throughout entire illness.   Discussed physical exam and available lab work findings in clinic with patient.  Counseled patient regarding appropriate use of medications and potential side effects for all medications recommended or prescribed today. Discussed red flag signs and symptoms of worsening condition,when to call the PCP  office, return to urgent care, and when to seek higher level of care in the emergency department. Patient verbalizes understanding and agreement with plan. All questions answered. Patient discharged in stable condition.  Final Clinical Impressions(s) / UC Diagnoses   Final diagnoses:  COVID-19  Nasal congestion  Fever, unspecified fever cause     Discharge Instructions      You have a viral upper respiratory infection.   Take paxlovid as prescribed.  Take guaifenesin '1200mg'$   2 times daily to thin your mucous so that you can cough it up and blow it out of your nose easier. Drink plenty of water while taking this medication so that it works well in  your body (at least 8 cups a day).   Take tessalon pearles every 8 hours as needed for cough.  You may take ibuprofen '400mg'$  every 6 hours with food as needed for fever/chills, sore throat, aches/pains, and inflammation associated with viral illness. Take this with food to avoid stomach upset.    If you develop any new or worsening symptoms, please return.  If your symptoms are severe, please go to the emergency room.  Follow-up with your primary care provider for further evaluation and management of your symptoms as well as ongoing wellness visits.  I hope you feel better!      ED Prescriptions     Medication Sig Dispense Auth. Provider   nirmatrelvir/ritonavir EUA (PAXLOVID) 20 x 150 MG & 10 x '100MG'$  TABS Take 3 tablets by mouth 2 (two) times daily for 5 days. Take nirmatrelvir (150 mg) two tablets twice daily for 5 days and ritonavir (100 mg) one tablet twice daily for 5 days. 30 tablet Joella Prince M, FNP   Guaifenesin 1200 MG TB12 Take 1 tablet (1,200 mg total) by mouth in the morning and at bedtime. 14 tablet Joella Prince M, FNP   benzonatate (TESSALON) 100 MG capsule Take 1 capsule (100 mg total) by mouth every 8 (eight) hours. 21 capsule Talbot Grumbling, FNP      PDMP not reviewed this encounter.   Talbot Grumbling, [place] 05/23/22 [phone]

## 2022-05-21 NOTE — Discharge Instructions (Addendum)
You have a viral upper respiratory infection.   Take paxlovid as prescribed.  Take guaifenesin '1200mg'$   2 times daily to thin your mucous so that you can cough it up and blow it out of your nose easier. Drink plenty of water while taking this medication so that it works well in your body (at least 8 cups a day).   Take tessalon pearles every 8 hours as needed for cough.  You may take ibuprofen '400mg'$  every 6 hours with food as needed for fever/chills, sore throat, aches/pains, and inflammation associated with viral illness. Take this with food to avoid stomach upset.    If you develop any new or worsening symptoms, please return.  If your symptoms are severe, please go to the emergency room.  Follow-up with your primary care provider for further evaluation and management of your symptoms as well as ongoing wellness visits.  I hope you feel better!

## 2022-05-21 NOTE — ED Triage Notes (Signed)
Pt reports started feeling bad last night. Reports cold symptoms, cough, fevers, fatigue, feeling bad. Tested covid+ today. Two doctors in family advised to get Paxlovid.

## 2022-06-08 ENCOUNTER — Other Ambulatory Visit: Payer: Self-pay | Admitting: Gastroenterology

## 2022-06-08 DIAGNOSIS — R634 Abnormal weight loss: Secondary | ICD-10-CM

## 2022-06-08 DIAGNOSIS — R194 Change in bowel habit: Secondary | ICD-10-CM

## 2022-06-08 DIAGNOSIS — R109 Unspecified abdominal pain: Secondary | ICD-10-CM

## 2022-06-13 ENCOUNTER — Ambulatory Visit
Admission: RE | Admit: 2022-06-13 | Discharge: 2022-06-13 | Disposition: A | Discharge status: Home / Self Care | Payer: Medicare PPO | Source: Ambulatory Visit | Attending: Gastroenterology | Admitting: Gastroenterology

## 2022-06-13 DIAGNOSIS — R634 Abnormal weight loss: Secondary | ICD-10-CM

## 2022-06-13 DIAGNOSIS — R109 Unspecified abdominal pain: Secondary | ICD-10-CM

## 2022-06-13 DIAGNOSIS — R194 Change in bowel habit: Secondary | ICD-10-CM

## 2022-06-13 MED ORDER — IOPAMIDOL (ISOVUE-300) INJECTION 61%
100.0000 mL | Freq: Once | INTRAVENOUS | Status: AC | PRN
  Administered 2022-06-13: 100 mL via INTRAVENOUS

## 2022-06-15 ENCOUNTER — Inpatient Hospital Stay: Admission: RE | Admit: 2022-06-15 | Payer: Medicare PPO | Source: Ambulatory Visit

## 2022-06-16 ENCOUNTER — Other Ambulatory Visit: Payer: Medicare PPO

## 2022-07-06 ENCOUNTER — Telehealth: Payer: Self-pay | Admitting: General Practice

## 2022-07-06 NOTE — Telephone Encounter (Signed)
Pt states: -daughter and grandson are patients. Would like to join the [facility] practice with Dr. Jonni Sanger. -Daughter Quitman Livings, on DPR   Please advise about adding patient to PCP panel.

## 2022-07-13 NOTE — Telephone Encounter (Signed)
Patient has been scheduled for 07/31/22 '@2pm'$ .   Patient states: -She has to travel 7 hours in order to make it to town - Would like to know if any later appointment time would be available - Said even 3pm would be better for her  Would it be okay to move patient to 3pm slot on 10/30? Please advise.

## 2022-07-31 ENCOUNTER — Encounter: Payer: Self-pay | Admitting: Family Medicine

## 2022-07-31 ENCOUNTER — Ambulatory Visit: Payer: Medicare PPO | Admitting: Family Medicine

## 2022-07-31 VITALS — BP 136/70 | HR 77 | Temp 98.2°F | Ht 65.0 in | Wt 114.4 lb

## 2022-07-31 DIAGNOSIS — E782 Mixed hyperlipidemia: Secondary | ICD-10-CM | POA: Diagnosis not present

## 2022-07-31 DIAGNOSIS — R634 Abnormal weight loss: Secondary | ICD-10-CM | POA: Diagnosis not present

## 2022-07-31 DIAGNOSIS — R630 Anorexia: Secondary | ICD-10-CM | POA: Diagnosis not present

## 2022-07-31 DIAGNOSIS — I10 Essential (primary) hypertension: Secondary | ICD-10-CM | POA: Diagnosis not present

## 2022-07-31 DIAGNOSIS — K5900 Constipation, unspecified: Secondary | ICD-10-CM

## 2022-07-31 NOTE — Progress Notes (Signed)
Subjective  CC:  Chief Complaint  Patient presents with   Establish Care    Pt stated that she has been experiencing some wt loss and having irregular bowel, can not produce salvia, etc    HPI: Gloria Cummings is a 81 y.o. female who presents to the office today to address the problems listed above in the chief complaint. 81 year old female who I last took care of back in 2019.  She moved to [place] and has spent time there with her significant other.  Unfortunately, over the last several months, they are having relationship problems.  She is now coming home to try to work things out. Her main concern is unintentional weight loss.  She reports a lack of appetite, difficulties eating, dry mouth which makes eating more difficult.  I reviewed notes from 6 months ago, weight was 123 pounds at that time.  She denies abdominal pain.  She did have a thorough work-up by Dr. Watt Climes.I reviewed CT scan and follow-up EGD, EGD was unremarkable.  She has no known problems with hypothyroidism, reflux.  She does endorse symptoms of anxiety and stress reaction.  She is not doing well overall but is trying to manage it with her daughter. Mood: Stressed, worried, feeling down.  No suicidal ideation.  She does not like to use pharmaceuticals.  She only takes a blood pressure pill.  This has been well controlled. Her last physical was in March.  She reports she had a normal mammogram at [facility] mammography in April. No further colon cancer screening is indicated given her age.  She denies melena, lower abdominal pain.  She admits to constipation.  Wt Readings from Last 3 Encounters:  07/31/22 114 lb 6.4 oz (51.9 kg)  09/06/18 123 lb 3.2 oz (55.9 kg)  04/17/18 127 lb 6 oz (57.8 kg)   Mild  Assessment  1. Weight loss   2. Mixed hyperlipidemia   3. Lack of appetite   4. Essential hypertension   5. Constipation, unspecified constipation type      Plan  Weight loss: Likely due to not taking  in enough calories.  We will check lab work.  Recommend increasing caloric intake and close follow-up.  We will recheck in 2 to 3 weeks.  No signs or symptoms of systemic disease.  Suspect mood is playing a large factor in her lack of appetite Adjustment reaction: Patient will manage behaviorally.  She declines pharmaceuticals. Hypertension f/u: BP control is well controlled.  Continue telmisartan HCTZ.  Check renal function electrolytes. Constipation: Discussed MiraLAX.  Education regarding management of these chronic disease states was given. Management strategies discussed on successive visits include dietary and exercise recommendations, goals of achieving and maintaining IBW, and lifestyle modifications aiming for adequate sleep and minimizing stressors.   Follow up: 2 weeks for recheck  Orders Placed This Encounter  Procedures   CBC with Differential/Platelet   Comprehensive metabolic panel   Hemoglobin A1c   TSH   Prealbumin   No orders of the defined types were placed in this encounter.     BP Readings from Last 3 Encounters:  07/31/22 136/70  05/21/22 137/62  09/06/18 (!) 142/72   Wt Readings from Last 3 Encounters:  07/31/22 114 lb 6.4 oz (51.9 kg)  09/06/18 123 lb 3.2 oz (55.9 kg)  04/17/18 127 lb 6 oz (57.8 kg)    No results found for: "CHOL" No results found for: "HDL" No results found for: "LDLCALC" No results found for: "TRIG"  No results found for: "CHOLHDL" No results found for: "LDLDIRECT" Lab Results  Component Value Date   CREATININE 0.90 02/09/2017   BUN 19 02/09/2017   NA 135 02/09/2017   K 3.8 02/09/2017   CL 101 02/09/2017   CO2 26 02/09/2017    The ASCVD Risk score (Arnett DK, et al., 2019) failed to calculate for the following reasons:   The 2019 ASCVD risk score is only valid for ages 63 to 38  I reviewed the patients updated PMH, FH, and SocHx.    Patient Active Problem List   Diagnosis Date Noted   History of breast cancer 03/12/2017     Priority: High   History of endometrial cancer 02/02/2017    Priority: High   Essential hypertension 01/20/2011    Priority: High   Family history of breast cancer     Priority: Medium    Family history of prostate cancer     Priority: Medium    Family history of colon cancer     Priority: Medium    Colon polyp 01/14/2013    Priority: Medium    Recurrent UTI 01/14/2013    Priority: Medium    Osteoarthritis, multiple sites 10/25/2010    Priority: Medium    Osteoporosis 10/25/2010    Priority: Medium    Genetic testing 04/12/2017    Priority: Low   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 She has never used smokeless tobacco. She reports that she does not drink alcohol and does not use drugs.  Current Meds  Medication Sig   aspirin EC 81 MG tablet Take 81 mg by mouth daily.   benzonatate (TESSALON) 100 MG capsule Take 1 capsule (100 mg total) by mouth every 8 (eight) hours.   calcium carbonate (TUMS EX) 750 MG chewable tablet Chew 1 tablet by mouth daily.    Cholecalciferol (VITAMIN D) 2000 units tablet Take 2,000 Units by mouth daily.   fish oil-omega-3 fatty acids 1000 MG capsule Take 4 g by mouth daily.    Multiple Vitamins-Minerals (MULTIVITAMIN WITH MINERALS) tablet Take 1 tablet by mouth daily.   Red Yeast Rice Extract 600 MG CAPS Take 1 capsule by mouth daily.   telmisartan-hydrochlorothiazide (MICARDIS HCT) 80-12.5 MG tablet Take 1 tablet by mouth daily.    Review of Systems: Cardiovascular: negative for chest pain, palpitations, leg swelling, orthopnea Respiratory: negative for SOB, wheezing or persistent cough Gastrointestinal: negative for abdominal pain Genitourinary: negative for dysuria or gross hematuria  Objective  Vitals: BP 136/70   Pulse 77   Temp 98.2 F (36.8 C)   Ht '5\' 5"'$  (1.651 m)    Wt 114 lb 6.4 oz (51.9 kg)   SpO2 96%   BMI 19.04 kg/m  General: no acute distress, thin Psych:  Alert and oriented, anxious mood and affect HEENT:  Normocephalic, atraumatic, supple neck  Cardiovascular:  RRR without murmur. no edema Respiratory:  Good breath sounds bilaterally, CTAB with normal respiratory effort Gastrointestinal: soft, flat abdomen, normal active bowel sounds, no palpable masses, no hepatosplenomegaly, no appreciated hernias Skin:  Warm, no rashes Neurologic:   Mental status is normal Commons side effects, risks, benefits, and alternatives for medications and treatment plan prescribed today were discussed, and the patient expressed understanding of the given instructions. Patient is instructed to call or message via MyChart if he/she has any questions or  concerns regarding our treatment plan. No barriers to understanding were identified. We discussed Red Flag symptoms and signs in detail. Patient expressed understanding regarding what to do in case of urgent or emergency type symptoms.  Medication list was reconciled, printed and provided to the patient in AVS. Patient instructions and summary information was reviewed with the patient as documented in the AVS. This note was prepared with assistance of Dragon voice recognition software. Occasional wrong-word or sound-a-like substitutions may have occurred due to the inherent limitation

## 2022-07-31 NOTE — Patient Instructions (Signed)
Please return in 2-3 weeks to go over results.   If you have any questions or concerns, please don't hesitate to send me a message via MyChart or call the office at [phone]. Thank you for visiting with [place] today! It's our pleasure caring for you.

## 2022-08-01 ENCOUNTER — Encounter: Payer: Self-pay | Admitting: Family Medicine

## 2022-08-02 ENCOUNTER — Other Ambulatory Visit (INDEPENDENT_AMBULATORY_CARE_PROVIDER_SITE_OTHER): Payer: Medicare PPO

## 2022-08-02 DIAGNOSIS — R630 Anorexia: Secondary | ICD-10-CM | POA: Diagnosis not present

## 2022-08-02 DIAGNOSIS — K5900 Constipation, unspecified: Secondary | ICD-10-CM

## 2022-08-02 DIAGNOSIS — I10 Essential (primary) hypertension: Secondary | ICD-10-CM | POA: Diagnosis not present

## 2022-08-02 DIAGNOSIS — R634 Abnormal weight loss: Secondary | ICD-10-CM | POA: Diagnosis not present

## 2022-08-02 LAB — CBC WITH DIFFERENTIAL/PLATELET
Basophils Absolute: 0 10*3/uL (ref 0.0–0.1)
Basophils Relative: 0.4 % (ref 0.0–3.0)
Eosinophils Absolute: 0.1 10*3/uL (ref 0.0–0.7)
Eosinophils Relative: 1.8 % (ref 0.0–5.0)
HCT: 37.3 % (ref 36.0–46.0)
Hemoglobin: 12.7 g/dL (ref 12.0–15.0)
Lymphocytes Relative: 22.6 % (ref 12.0–46.0)
Lymphs Abs: 1.3 10*3/uL (ref 0.7–4.0)
MCHC: 34.1 g/dL (ref 30.0–36.0)
MCV: 91.7 fl (ref 78.0–100.0)
Monocytes Absolute: 0.5 10*3/uL (ref 0.1–1.0)
Monocytes Relative: 8.4 % (ref 3.0–12.0)
Neutro Abs: 3.8 10*3/uL (ref 1.4–7.7)
Neutrophils Relative %: 66.8 % (ref 43.0–77.0)
Platelets: 267 10*3/uL (ref 150.0–400.0)
RBC: 4.07 Mil/uL (ref 3.87–5.11)
RDW: 13.2 % (ref 11.5–15.5)
WBC: 5.7 10*3/uL (ref 4.0–10.5)

## 2022-08-02 LAB — COMPREHENSIVE METABOLIC PANEL
ALT: 19 U/L (ref 0–35)
AST: 25 U/L (ref 0–37)
Albumin: 4.6 g/dL (ref 3.5–5.2)
Alkaline Phosphatase: 51 U/L (ref 39–117)
BUN: 27 mg/dL — ABNORMAL HIGH (ref 6–23)
CO2: 31 mEq/L (ref 19–32)
Calcium: 11.1 mg/dL — ABNORMAL HIGH (ref 8.4–10.5)
Chloride: 100 mEq/L (ref 96–112)
Creatinine, Ser: 1.29 mg/dL — ABNORMAL HIGH (ref 0.40–1.20)
GFR: 38.55 mL/min — ABNORMAL LOW (ref >60.00)
Glucose, Bld: 94 mg/dL (ref 70–99)
Potassium: 4.6 mEq/L (ref 3.5–5.1)
Sodium: 138 mEq/L (ref 135–145)
Total Bilirubin: 0.7 mg/dL (ref 0.2–1.2)
Total Protein: 7.2 g/dL (ref 6.0–8.3)

## 2022-08-02 LAB — HEMOGLOBIN A1C: Hgb A1c MFr Bld: 5.9 % (ref 4.6–6.5)

## 2022-08-02 LAB — TSH: TSH: 3.3 u[IU]/mL (ref 0.35–5.50)

## 2022-08-02 NOTE — Progress Notes (Signed)
Please add on PTH, dx: hypercalcemia  Thanks, Dr. Jonni Sanger '

## 2022-08-03 LAB — PREALBUMIN: Prealbumin: 28 mg/dL (ref 17–34)

## 2022-08-03 NOTE — Progress Notes (Signed)
Please add on PTH , dx: hypercalceia Thanks, Dr. Jonni Sanger '

## 2022-08-10 NOTE — Progress Notes (Signed)
Please call patient: I have reviewed his/her lab results. Lab results showed a high calcium level" this needs to be rechecked. Please return for lab and order a pth with ionized calcium for dx: hypercalcemia. Kidney function is a little low but nothing else is very significant.  F/u as discussed

## 2022-08-14 ENCOUNTER — Other Ambulatory Visit: Payer: Self-pay

## 2022-08-16 ENCOUNTER — Encounter: Payer: Self-pay | Admitting: Family Medicine

## 2022-08-16 ENCOUNTER — Ambulatory Visit: Payer: Medicare PPO | Admitting: Family Medicine

## 2022-08-16 VITALS — BP 150/70 | HR 72 | Temp 98.0°F | Ht 65.0 in | Wt 114.8 lb

## 2022-08-16 DIAGNOSIS — N1831 Chronic kidney disease, stage 3a: Secondary | ICD-10-CM | POA: Diagnosis not present

## 2022-08-16 DIAGNOSIS — I10 Essential (primary) hypertension: Secondary | ICD-10-CM | POA: Diagnosis not present

## 2022-08-16 DIAGNOSIS — R634 Abnormal weight loss: Secondary | ICD-10-CM | POA: Diagnosis not present

## 2022-08-16 LAB — SEDIMENTATION RATE: Sed Rate: 9 mm/hr (ref 0–30)

## 2022-08-16 MED ORDER — TELMISARTAN-HCTZ 80-25 MG PO TABS: 1.0000 | ORAL_TABLET | Freq: Every day | ORAL | 3 refills | Status: DC

## 2022-08-16 NOTE — Patient Instructions (Signed)
Please return in 3 months for your annual complete physical; please come fasting and weight recheck, blood pressure recheck.   I will release your lab results to you on your MyChart account with further instructions. You may see the results before I do, but when I review them I will send you a message with my report or have my assistant call you if things need to be discussed. Please reply to my message with any questions. Thank you!   If you have any questions or concerns, please don't hesitate to send me a message via MyChart or call the office at [phone]. Thank you for visiting with Gloria Cummings today! It's our pleasure caring for you.

## 2022-08-16 NOTE — Progress Notes (Signed)
Subjective  CC:  Chief Complaint  Patient presents with   Go over results    HPI: Gloria Cummings is a 81 y.o. female who presents to the office today to address the problems listed above in the chief complaint. Hypertension f/u: Control is  unclear, elevated reading today . Pt reports she is doing well. taking medications as instructed, no medication side effects noted, no TIAs, no chest pain on exertion, no dyspnea on exertion, no swelling of ankles. Doesn't check bp outside of office. Renal function with GFR 35; lytes ok. She denies adverse effects from his BP medications. Compliance with medication is good.  Weight loss: reports she is eating better. Upon reflection she feels likely stress was causing her lack of appetite. She eats very healthy diet. Trying to take in more calories. Weight is stable today.  High calcium on labs. Does take supplements.  Pt reports DNR.   Assessment  1. Essential hypertension   2. Chronic kidney disease, stage 3a ([place])   3. Hypercalcemia   4. Weight loss      Plan   Hypertension f/u: BP control is fairly well controlled. But will increase hctz dose today given high reading. Ordered telmisartan/hctz 80/25.  Recheck ionized calcium and pth Discussed healthy fats and proteins to increase caloric intake and monitor weight. Check ESR. Other labs were unrerevealing Monitor kidney function.  Education regarding management of these chronic disease states was given. Management strategies discussed on successive visits include dietary and exercise recommendations, goals of achieving and maintaining IBW, and lifestyle modifications aiming for adequate sleep and minimizing stressors.   Follow up: 3 mo for cpe and recheck  Orders Placed This Encounter  Procedures   Sedimentation rate   PTH, Intact (ICMA) and Ionized Calcium   Meds ordered this encounter  Medications   telmisartan-hydrochlorothiazide (MICARDIS HCT) 80-25 MG tablet    Sig: Take 1  tablet by mouth daily.    Dispense:  90 tablet    Refill:  3      BP Readings from Last 3 Encounters:  08/16/22 (!) 150/70  07/31/22 136/70  05/21/22 137/62   Wt Readings from Last 3 Encounters:  08/16/22 114 lb 12.8 oz (52.1 kg)  07/31/22 114 lb 6.4 oz (51.9 kg)  09/06/18 123 lb 3.2 oz (55.9 kg)    No results found for: "CHOL" No results found for: "HDL" No results found for: "LDLCALC" No results found for: "TRIG" No results found for: "CHOLHDL" No results found for: "LDLDIRECT" Lab Results  Component Value Date   CREATININE 1.29 (H) 08/02/2022   BUN 27 (H) 08/02/2022   NA 138 08/02/2022   K 4.6 08/02/2022   CL 100 08/02/2022   CO2 31 08/02/2022    The ASCVD Risk score (Arnett DK, et al., 2019) failed to calculate for the following reasons:   The 2019 ASCVD risk score is only valid for ages 54 to 48  I reviewed the patients updated PMH, FH, and SocHx.    Patient Active Problem List   Diagnosis Date Noted   Chronic kidney disease, stage 3a ([place]) 08/16/2022    Priority: High   History of breast cancer 03/12/2017    Priority: High   History of endometrial cancer 02/02/2017    Priority: High   Essential hypertension 01/20/2011    Priority: High   Family history of breast cancer     Priority: Medium    Family history of prostate cancer     Priority: Medium  Family history of colon cancer     Priority: Medium    Colon polyp 01/14/2013    Priority: Medium    Recurrent UTI 01/14/2013    Priority: Medium    Osteoarthritis, multiple sites 10/25/2010    Priority: Medium    Osteoporosis 10/25/2010    Priority: Medium    Genetic testing 04/12/2017    Priority: Low   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 She has never used smokeless tobacco. She reports that she does not drink alcohol and does not  use drugs.  Current Meds  Medication Sig   telmisartan-hydrochlorothiazide (MICARDIS HCT) 80-25 MG tablet Take 1 tablet by mouth daily.    Review of Systems: Cardiovascular: negative for chest pain, palpitations, leg swelling, orthopnea Respiratory: negative for SOB, wheezing or persistent cough Gastrointestinal: negative for abdominal pain Genitourinary: negative for dysuria or gross hematuria  Objective  Vitals: BP (!) 150/70   Pulse 72   Temp 98 F (36.7 C)   Ht 5' 5" (1.651 m)   Wt 114 lb 12.8 oz (52.1 kg)   SpO2 98%   BMI 19.10 kg/m  General: no acute distress  Psych:  Alert and oriented, normal mood and affect HEENT:  Normocephalic, atraumatic, supple neck  Cardiovascular:  RRR without murmur. no edema Respiratory:  Good breath sounds bilaterally, CTAB with normal respiratory effort Skin:  Warm, no rashes Neurologic:   Mental status is normal Commons side effects, risks, benefits, and alternatives for medications and treatment plan prescribed today were discussed, and the patient expressed understanding of the given instructions. Patient is instructed to call or message via MyChart if he/she has any questions or concerns regarding our treatment plan. No barriers to understanding were identified. We discussed Red Flag symptoms and signs in detail. Patient expressed understanding regarding what to do in case of urgent or emergency type symptoms.  Medication list was reconciled, printed and provided to the patient in AVS. Patient instructions and summary information was reviewed with the patient as documented in the AVS.

## 2022-08-17 LAB — PTH, INTACT (ICMA) AND IONIZED CALCIUM
Calcium, Ion: 5.4 mg/dL (ref 4.7–5.5)
Calcium: 10.6 mg/dL — ABNORMAL HIGH (ref 8.6–10.4)
PTH: 16 pg/mL (ref 16–77)

## 2022-08-29 ENCOUNTER — Ambulatory Visit (INDEPENDENT_AMBULATORY_CARE_PROVIDER_SITE_OTHER): Payer: Medicare PPO

## 2022-08-29 VITALS — Wt 114.0 lb

## 2022-08-29 DIAGNOSIS — Z0000 Encounter for general adult medical examination without abnormal findings: Secondary | ICD-10-CM

## 2022-08-29 NOTE — Patient Instructions (Signed)
[name] , Thank you for taking time to come for your Medicare Wellness Visit. I appreciate your ongoing commitment to your health goals. Please review the following plan we discussed and let me know if I can assist you in the future.   These are the goals we discussed:  Goals      Patient Stated     Continue to be active.         This is a list of the screening recommended for you and due dates:  Health Maintenance  Topic Date Due   COVID-19 Vaccine (3 - 2023-24 season) 09/01/2022*   Zoster (Shingles) Vaccine (1 of 2) 11/16/2022*   Flu Shot  12/31/2022*   Mammogram  01/01/2023   Medicare Annual Wellness Visit  08/30/2023   Pneumonia Vaccine  Completed   HPV Vaccine  Aged Out   DEXA scan (bone density measurement)  Discontinued  *Topic was postponed. The date shown is not the original due date.    Advanced directives: copies in chart   Conditions/risks identified: increase dancing for exercise   Next appointment: Follow up in one year for your annual wellness visit    Preventive Care 65 Years and Older, Female Preventive care refers to lifestyle choices and visits with your health care provider that can promote health and wellness. What does preventive care include? A yearly physical exam. This is also called an annual well check. Dental exams once or twice a year. Routine eye exams. Ask your health care provider how often you should have your eyes checked. Personal lifestyle choices, including: Daily care of your teeth and gums. Regular physical activity. Eating a healthy diet. Avoiding tobacco and drug use. Limiting alcohol use. Practicing safe sex. Taking low-dose aspirin every day. Taking vitamin and mineral supplements as recommended by your health care provider. What happens during an annual well check? The services and screenings done by your health care provider during your annual well check will depend on your age, overall health, lifestyle risk  factors, and family history of disease. Counseling  Your health care provider may ask you questions about your: Alcohol use. Tobacco use. Drug use. Emotional well-being. Home and relationship well-being. Sexual activity. Eating habits. History of falls. Memory and ability to understand (cognition). Work and work Statistician. Reproductive health. Screening  You may have the following tests or measurements: Height, weight, and BMI. Blood pressure. Lipid and cholesterol levels. These may be checked every 5 years, or more frequently if you are over 66 years old. Skin check. Lung cancer screening. You may have this screening every year starting at age 81 if you have a 30-pack-year history of smoking and currently smoke or have quit within the past 15 years. Fecal occult blood test (FOBT) of the stool. You may have this test every year starting at age 5. Flexible sigmoidoscopy or colonoscopy. You may have a sigmoidoscopy every 5 years or a colonoscopy every 10 years starting at age 70. Hepatitis C blood test. Hepatitis B blood test. Sexually transmitted disease (STD) testing. Diabetes screening. This is done by checking your blood sugar (glucose) after you have not eaten for a while (fasting). You may have this done every 1-3 years. Bone density scan. This is done to screen for osteoporosis. You may have this done starting at age 80. Mammogram. This may be done every 1-2 years. Talk to your health care provider about how often you should have regular mammograms. Talk with your health care provider about your test results, treatment  options, and if necessary, the need for more tests. Vaccines  Your health care provider may recommend certain vaccines, such as: Influenza vaccine. This is recommended every year. Tetanus, diphtheria, and acellular pertussis (Tdap, Td) vaccine. You may need a Td booster every 10 years. Zoster vaccine. You may need this after age 52. Pneumococcal 13-valent  conjugate (PCV13) vaccine. One dose is recommended after age 58. Pneumococcal polysaccharide (PPSV23) vaccine. One dose is recommended after age 31. Talk to your health care provider about which screenings and vaccines you need and how often you need them. This information is not intended to replace advice given to you by your health care provider. Make sure you discuss any questions you have with your health care provider. Document Released: 10/15/2015 Document Revised: 06/07/2016 Document Reviewed: 07/20/2015 Elsevier Interactive Patient Education  2017 [place] Prevention in the Home Falls can cause injuries. They can happen to people of all ages. There are many things you can do to make your home safe and to help prevent falls. What can I do on the outside of my home? Regularly fix the edges of walkways and driveways and fix any cracks. Remove anything that might make you trip as you walk through a door, such as a raised step or threshold. Trim any bushes or trees on the path to your home. Use bright outdoor lighting. Clear any walking paths of anything that might make someone trip, such as rocks or tools. Regularly check to see if handrails are loose or broken. Make sure that both sides of any steps have handrails. Any raised decks and porches should have guardrails on the edges. Have any leaves, snow, or ice cleared regularly. Use sand or salt on walking paths during winter. Clean up any spills in your garage right away. This includes oil or grease spills. What can I do in the bathroom? Use night lights. Install grab bars by the toilet and in the tub and shower. Do not use towel bars as grab bars. Use non-skid mats or decals in the tub or shower. If you need to sit down in the shower, use a plastic, non-slip stool. Keep the floor dry. Clean up any water that spills on the floor as soon as it happens. Remove soap buildup in the tub or shower regularly. Attach bath mats  securely with double-sided non-slip rug tape. Do not have throw rugs and other things on the floor that can make you trip. What can I do in the bedroom? Use night lights. Make sure that you have a light by your bed that is easy to reach. Do not use any sheets or blankets that are too big for your bed. They should not hang down onto the floor. Have a firm chair that has side arms. You can use this for support while you get dressed. Do not have throw rugs and other things on the floor that can make you trip. What can I do in the kitchen? Clean up any spills right away. Avoid walking on wet floors. Keep items that you use a lot in easy-to-reach places. If you need to reach something above you, use a strong step stool that has a grab bar. Keep electrical cords out of the way. Do not use floor polish or wax that makes floors slippery. If you must use wax, use non-skid floor wax. Do not have throw rugs and other things on the floor that can make you trip. What can I do with my stairs? Do not  leave any items on the stairs. Make sure that there are handrails on both sides of the stairs and use them. Fix handrails that are broken or loose. Make sure that handrails are as long as the stairways. Check any carpeting to make sure that it is firmly attached to the stairs. Fix any carpet that is loose or worn. Avoid having throw rugs at the top or bottom of the stairs. If you do have throw rugs, attach them to the floor with carpet tape. Make sure that you have a light switch at the top of the stairs and the bottom of the stairs. If you do not have them, ask someone to add them for you. What else can I do to help prevent falls? Wear shoes that: Do not have high heels. Have rubber bottoms. Are comfortable and fit you well. Are closed at the toe. Do not wear sandals. If you use a stepladder: Make sure that it is fully opened. Do not climb a closed stepladder. Make sure that both sides of the stepladder  are locked into place. Ask someone to hold it for you, if possible. Clearly mark and make sure that you can see: Any grab bars or handrails. First and last steps. Where the edge of each step is. Use tools that help you move around (mobility aids) if they are needed. These include: Canes. Walkers. Scooters. Crutches. Turn on the lights when you go into a dark area. Replace any light bulbs as soon as they burn out. Set up your furniture so you have a clear path. Avoid moving your furniture around. If any of your floors are uneven, fix them. If there are any pets around you, be aware of where they are. Review your medicines with your doctor. Some medicines can make you feel dizzy. This can increase your chance of falling. Ask your doctor what other things that you can do to help prevent falls. This information is not intended to replace advice given to you by your health care provider. Make sure you discuss any questions you have with your health care provider. Document Released: 07/15/2009 Document Revised: 02/24/2016 Document Reviewed: 10/23/2014 Elsevier Interactive Patient Education  2017 [employer].

## 2022-08-29 NOTE — Progress Notes (Signed)
I connected with  Gloria Cummings on 43[birth year] by a audio enabled telemedicine application and verified that I am speaking with the correct person using two identifiers.  Patient Location: Home  Provider Location: Office/Clinic  I discussed the limitations of evaluation and management by telemedicine. The patient expressed understanding and agreed to proceed.  Subjective:   Gloria Cummings is a 81 y.o. female who presents for Medicare Annual (Subsequent) preventive examination.  Review of Systems     Cardiac Risk Factors include: advanced age (>46mn, >>13women);hypertension     Objective:    Today's Vitals   08/29/22 0919  Weight: 114 lb (51.7 kg)   Body mass index is 18.97 kg/m.     08/29/2022    9:33 AM 09/06/2018   12:48 PM 04/17/2018   11:26 AM 02/19/2017   10:10 AM 02/08/2017    4:00 PM 02/06/2017    2:38 PM 08/26/2015    5:48 PM  Advanced Directives  Does Patient Have a Medical Advance Directive? Yes Yes Yes Yes Yes Yes No  Type of AParamedicof AJohnsonLiving will HBunker HillLiving will HPine RidgeLiving will HV[place]Living will H[place]Living will H[place]Living will   Does patient want to make changes to medical advance directive? No - Patient declined    No - Patient declined    Copy of [place] Chart? Yes - validated most recent copy scanned in chart (See row information) Yes - validated most recent copy scanned in chart (See row information) Yes Yes  Yes     Current Medications (verified) Outpatient Encounter Medications as of 08/29/2022  Medication Sig   aspirin EC 81 MG tablet Take 81 mg by mouth daily.   calcium carbonate (TUMS EX) 750 MG chewable tablet Chew 1 tablet by mouth daily. Dc 3 2000   Cholecalciferol (VITAMIN D) 2000 units tablet Take 2,000 Units by mouth daily.   fish oil-omega-3 fatty acids 1000 MG  capsule Take 4 g by mouth daily.    Multiple Vitamins-Minerals (MULTIVITAMIN WITH MINERALS) tablet Take 1 tablet by mouth daily.   Red Yeast Rice Extract 600 MG CAPS Take 1 capsule by mouth daily.   telmisartan-hydrochlorothiazide (MICARDIS HCT) 80-25 MG tablet Take 1 tablet by mouth daily. (Patient taking differently: Take 1 tablet by mouth daily. 80-12.5 per pt)   No facility-administered encounter medications on file as of 08/29/2022.    Allergies (verified) Lubricants, Betadine [povidone iodine], and Lisinopril   History: Past Medical History:  Diagnosis Date   Anemia    Arthritis    [place]cer ([place]    endo metrial   left breast   Cataract    Dyspnea    hx of   Family history of adverse reaction to anesthesia    Mother died following surgery stroke   Family history of breast cancer    Family history of colon cancer    Family history of prostate cancer    Family history of stomach cancer    Heart murmur    History of colon polyps    Hypertension    Occipital neuralgia    Osteoporosis    Pneumonia    Past Surgical History:  Procedure Laterality Date   BREAST LUMPECTOMY     left lumpectomy   COLON SURGERY     procedures   EYE SURGERY     implants on both eyes   FOOT SURGERY  FRACTURE SURGERY     ROBOTIC ASSISTED LAP VAGINAL HYSTERECTOMY N/A 02/08/2017   Procedure: XI ROBOTIC ASSISTED LAPAROSCOPIC TOTAL HYSTERECTOMY, BILATERAL SALPINGOOPHORECTOMY;  Surgeon: Everitt Amber, MD;  Location: [facility];  Service: Gynecology;  Laterality: N/A;   SENTINEL NODE BIOPSY N/A 02/08/2017   Procedure: SENTINEL NODE BIOPSY;  Surgeon: Everitt Amber, MD;  Location: [facility];  Service: Gynecology;  Laterality: N/A;   TUBAL LIGATION     WISDOM TOOTH EXTRACTION     Family History  Problem Relation Age of Onset   Stroke Mother    Heart disease Father    Stroke Father    Heart disease Brother    Heart disease Maternal Grandmother    Heart disease Maternal Grandfather    Cancer Paternal  Grandfather        NOS   Breast cancer Paternal Aunt        dx over 70   Stomach cancer Paternal Uncle    Prostate cancer Paternal Uncle    Cancer Paternal Aunt        possible colon cancer   Breast cancer Cousin        paternal first cousin dx over 63   Social History   Socioeconomic History   Marital status: Divorced    Spouse name: Not on file   Number of children: Not on file   Years of education: Not on file   Highest education level: Not on file  Occupational History   Not on file  Tobacco Use   Smoking status: Never   Smokeless tobacco: Never  Vaping Use   Vaping Use: Never used  Substance and Sexual Activity   Alcohol use: No   Drug use: No   Sexual activity: Not Currently  Other Topics Concern   Not on file  Social History Narrative   Not on file   Social Determinants of Health   Financial Resource Strain: Low Risk  (08/29/2022)   Overall Financial Resource Strain (CARDIA)    Difficulty of Paying Living Expenses: Not hard at all  Food Insecurity: No Food Insecurity (08/29/2022)   Hunger Vital Sign    Worried About Running Out of Food in the Last Year: Never true    Ran Out of Food in the Last Year: Never true  Transportation Needs: No Transportation Needs (08/29/2022)   PRAPARE - Hydrologist (Medical): No    Lack of Transportation (Non-Medical): No  Physical Activity: Inactive (08/29/2022)   Exercise Vital Sign    Days of Exercise per Week: 0 days    Minutes of Exercise per Session: 0 min  Stress: No Stress Concern Present (08/29/2022)   [place]    Feeling of Stress : Not at all  Social Connections: Moderately Isolated (08/29/2022)   Social Connection and Isolation Panel [NHANES]    Frequency of Communication with Friends and Family: More than three times a week    Frequency of Social Gatherings with Friends and Family: More than three times a week     Attends Religious Services: 1 to 4 times per year    Active Member of [employer] or Organizations: No    Attends Archivist Meetings: Never    Marital Status: Divorced    Tobacco Counseling Counseling given: Not Answered   Clinical Intake:  Pre-visit preparation completed: Yes  Pain : No/denies pain     BMI - recorded: 18.97 Nutritional Status: BMI <19  Underweight Nutritional  Risks: Unintentional weight loss (pt stated) Diabetes: No  How often do you need to have someone help you when you read instructions, pamphlets, or other written materials from your doctor or pharmacy?: 1 - Never  Diabetic?no  Interpreter Needed?: No  Information entered by :: Charlott Rakes, LPN   Activities of Daily Living    08/29/2022    9:34 AM  In your present state of health, do you have any difficulty performing the following activities:  Hearing? 0  Vision? 0  Difficulty concentrating or making decisions? 0  Walking or climbing stairs? 0  Dressing or bathing? 0  Doing errands, shopping? 0  Preparing Food and eating ? N  Using the Toilet? N  In the past six months, have you accidently leaked urine? N  Do you have problems with loss of bowel control? N  Managing your Medications? N  Managing your Finances? N  Housekeeping or managing your Housekeeping? N    Patient Care Team: Leamon Arnt, MD as PCP - General (Family Medicine) Royston Sinner Colin Benton, MD as Consulting Physician (Obstetrics and Gynecology) Everitt Amber, MD as Consulting Physician (Gynecologic Oncology) Toribio Harbour, [place] as Referring Physician (Chiropractic Medicine) Monna Fam, MD as Consulting Physician (Ophthalmology) Augustina Mood, DDS as Referring Physician (Dentistry) Rolm Bookbinder, MD as Consulting Physician (Dermatology)  Indicate any recent Medical Services you may have received from other than [facility] providers in the past year (date may be approximate).     Assessment:   This is a  routine wellness examination for [facility].  Hearing/Vision screen Hearing Screening - Comments:: Pt denies any hearing issues  Vision Screening - Comments:: Pt follows up with Dr Herbert Deaner for annul eye exams   Dietary issues and exercise activities discussed: Current Exercise Habits: The patient does not participate in regular exercise at present   Goals Addressed             This Visit's Progress    Patient Stated       Increase dancing for exercise        Depression Screen    08/29/2022    9:25 AM 08/16/2022   11:29 AM 07/31/2022    3:50 PM 04/17/2018   11:27 AM 11/29/2017    2:04 PM 11/29/2017    2:03 PM 01/31/2016   11:15 AM  PHQ 2/9 Scores  PHQ - 2 Score 2 0 0 0 0 0 0  PHQ- 9 Score 5     0     Fall Risk    08/29/2022    9:34 AM 08/16/2022   11:29 AM 07/31/2022    3:50 PM 04/17/2018   11:27 AM 11/29/2017    2:04 PM  Reform in the past year? 0 0 0 No No  Number falls in past yr: 0 0 0    Injury with Fall? 0 0 0    Risk for fall due to : Impaired vision No Fall Risks No Fall Risks    Follow up Falls prevention discussed Falls evaluation completed Falls evaluation completed      [place]:  Any stairs in or around the home? Yes  If so, are there any without handrails? No  Home free of loose throw rugs in walkways, pet beds, electrical cords, etc? Yes  Adequate lighting in your home to reduce risk of falls? Yes   ASSISTIVE DEVICES UTILIZED TO PREVENT FALLS:  Life alert? No  Use of a cane, walker  or w/c? No  Grab bars in the bathroom? No  Shower chair or bench in shower? No  Elevated toilet seat or a handicapped toilet? No   TIMED UP AND GO:  Was the test performed? No .   Cognitive Function:    04/17/2018   11:28 AM  MMSE - Mini Mental State Exam  Orientation to time 5  Orientation to Place 5  Registration 3  Attention/ Calculation 5  Recall 3  Language- name 2 objects 2  Language- repeat 1  Language-  follow 3 step command 3  Language- read & follow direction 1  Write a sentence 1  Copy design 1  Total score 30        08/29/2022    9:35 AM  6CIT Screen  What Year? 0 points  What month? 0 points  What time? 0 points  Count back from 20 0 points  Months in reverse 0 points  Repeat phrase 0 points  Total Score 0 points    Immunizations Immunization History  Administered Date(s) Administered   Influenza Split 09/03/2008, 07/25/2010   Influenza, High Dose Seasonal PF 09/19/2014, 09/19/2014, 08/16/2015, 08/16/2015, 08/17/2016   Influenza, Seasonal, Injecte, Preservative Fre 07/29/2014   Influenza,trivalent, recombinat, inj, PF 07/24/2011   Janssen (J&J) SARS-COV-2 Vaccination 01/20/2021   Moderna Covid-19 Vaccine Bivalent Booster 17yr & up 11/28/2019   Pneumococcal Conjugate-13 07/29/2014, 09/19/2014   Pneumococcal Polysaccharide-23 07/29/2005   Tdap 10/25/2010, 01/31/2016   Zoster, Live 09/03/2008    TDAP status: Up to date  Flu Vaccine status: Declined, Education has been provided regarding the importance of this vaccine but patient still declined. Advised may receive this vaccine at local pharmacy or Health Dept. Aware to provide a copy of the vaccination record if obtained from local pharmacy or Health Dept. Verbalized acceptance and understanding.  Pneumococcal vaccine status: Up to date  Covid-19 vaccine status: Completed vaccines  Qualifies for Shingles Vaccine? Yes   Zostavax completed No   Shingrix Completed?: No.    Education has been provided regarding the importance of this vaccine. Patient has been advised to call insurance company to determine out of pocket expense if they have not yet received this vaccine. Advised may also receive vaccine at local pharmacy or Health Dept. Verbalized acceptance and understanding.  Screening Tests Health Maintenance  Topic Date Due   COVID-19 Vaccine (3 - 2023-24 season) 09/01/2022 (Originally 06/02/2022)   Zoster  Vaccines- Shingrix (1 of 2) 11/16/2022 (Originally [birth year]1989)   INFLUENZA VACCINE  12/31/2022 (Originally 05/02/2022)   MAMMOGRAM  01/01/2023   Medicare Annual Wellness (AWV)  08/30/2023   Pneumonia Vaccine 81 Years old  Completed   HPV VACCINES  Aged Out   DEXA SComfortMaintenance  There are no preventive care reminders to display for this patient.   Colorectal cancer screening: No longer required.   Mammogram status: Completed 12/31/21. Repeat every year    Additional Screening:  Vision Screening: Recommended annual ophthalmology exams for early detection of glaucoma and other disorders of the eye. Is the patient up to date with their annual eye exam?  Yes  Who is the provider or what is the name of the office in which the patient attends annual eye exams? Dr HHerbert Deaner If pt is not established with a provider, would they like to be referred to a provider to establish care? No .   Dental Screening: Recommended annual dental exams for proper oral hygiene  Community Resource Referral / Chronic  Care Management: CRR required this visit?  No   CCM required this visit?  No      Plan:     I have personally reviewed and noted the following in the patient's chart:   Medical and social history Use of alcohol, tobacco or illicit drugs  Current medications and supplements including opioid prescriptions. Patient is not currently taking opioid prescriptions. Functional ability and status Nutritional status Physical activity Advanced directives List of other physicians Hospitalizations, surgeries, and ER visits in previous 12 months Vitals Screenings to include cognitive, depression, and falls Referrals and appointments  In addition, I have reviewed and discussed with patient certain preventive protocols, quality metrics, and best practice recommendations. A written personalized care plan for preventive services as well as general preventive health  recommendations were provided to patient.     Willette Brace, LPN   [id number]   Nurse Notes: none

## 2022-11-01 ENCOUNTER — Ambulatory Visit (INDEPENDENT_AMBULATORY_CARE_PROVIDER_SITE_OTHER): Payer: Medicare PPO | Admitting: Family Medicine

## 2022-11-01 ENCOUNTER — Encounter: Payer: Self-pay | Admitting: Family Medicine

## 2022-11-01 VITALS — BP 130/60 | HR 71 | Temp 97.9°F | Ht 65.0 in | Wt 113.8 lb

## 2022-11-01 DIAGNOSIS — N1831 Chronic kidney disease, stage 3a: Secondary | ICD-10-CM | POA: Diagnosis not present

## 2022-11-01 DIAGNOSIS — I10 Essential (primary) hypertension: Secondary | ICD-10-CM | POA: Diagnosis not present

## 2022-11-01 DIAGNOSIS — M810 Age-related osteoporosis without current pathological fracture: Secondary | ICD-10-CM

## 2022-11-01 DIAGNOSIS — Z0000 Encounter for general adult medical examination without abnormal findings: Secondary | ICD-10-CM | POA: Diagnosis not present

## 2022-11-01 MED ORDER — TELMISARTAN-HCTZ 80-12.5 MG PO TABS: 1.0000 | ORAL_TABLET | Freq: Every day | ORAL | 3 refills | Status: DC

## 2022-11-01 MED ORDER — SHINGRIX 50 MCG/0.5ML IM SUSR: 0.5000 mL | Freq: Once | INTRAMUSCULAR | 0 refills | Status: AC

## 2022-11-01 NOTE — Progress Notes (Signed)
Subjective  Chief Complaint  Patient presents with   Annual Exam    Pt here for Annual Exam and is currently fasting    Hypertension    HPI: Gloria Cummings is a 82 y.o. female who presents to [facility] today for a Female Wellness Visit. She also has the concerns and/or needs as listed above in the chief complaint. These will be addressed in addition to the Health Maintenance Visit.   Wellness Visit: annual visit with health maintenance review and exam without Pap  Health maintenance: Mammogram is current and was normal.  No other screens indicated at this time.  Overall, she is adjusting well living back here in [place] independently.  Having some sleep related issues but managing well overall.  Mainly gets off schedule and is working to improve that. Chronic disease f/u and/or acute problem visit: (deemed necessary to be done in addition to the wellness visit): Hypertension: She remains on Micardis HCT 80/12.5 daily.  She never increased the dose as recommended back in November, however her blood pressure has been well-controlled.  No chest pain, shortness of breath or lower extremity edema.  Electrolytes and renal function at that time were evaluated, she does have mild chronic kidney disease Weight loss: Weight has stabilized.  She is eating much better, healthy foods appetite is improving.  She keeps very active with dancing.  No further weight loss. Osteoporosis: Patient declines further evaluation or treatment.  She continues weightbearing exercises.  Assessment  1. Annual physical exam   2. Essential hypertension   3. Chronic kidney disease, stage 3a (HCC)   4. Age-related osteoporosis without current pathological fracture      Plan  Female Wellness Visit: Age appropriate Health Maintenance and Prevention measures were discussed with patient. Included topics are cancer screening recommendations, ways to keep healthy (see AVS) including dietary  and exercise recommendations, regular eye and dental care, use of seat belts, and avoidance of moderate alcohol use and tobacco use.  BMI: discussed patient's BMI and encouraged positive lifestyle modifications to help get to or maintain a target BMI. HM needs and immunizations were addressed and ordered. See below for orders. See HM and immunization section for updates.  Eligible for Shingrix, education given.  Prescription provided.  Patient declines flu and COVID vaccinations.  She did report reaction to the [place] early on in the pandemic.  She is hesitant to take another Routine labs and screening tests ordered including cmp, cbc and lipids where appropriate. Discussed recommendations regarding Vit D and calcium supplementation (see AVS)  Chronic disease management visit and/or acute problem visit: Hypertension: Well-controlled on Micardis HCT 80/12.5 daily.  Recheck 6 months. Chronic kidney disease: Avoid nephrotoxins.  Control blood pressure. Osteoporosis: Declines medications or further evaluations.  Continue weightbearing exercise.  Calcium was mildly elevated so no oral supplements recommended.  Continue vitamin D Adjustment anxiety: Much improved.  Continue to monitor Follow up: 6 months for blood pressure recheck No orders of the defined types were placed in this encounter.  Meds ordered this encounter  Medications   telmisartan-hydrochlorothiazide (MICARDIS HCT) 80-12.5 MG tablet    Sig: Take 1 tablet by mouth daily.    Dispense:  90 tablet    Refill:  3   Zoster Vaccine Adjuvanted [facility]) injection    Sig: Inject 0.5 mLs into the muscle once for 1 dose. Please give 2nd dose 2-6 months after first dose    Dispense:  2 each  Refill:  0      Body mass index is 18.94 kg/m. Wt Readings from Last 3 Encounters:  11/01/22 113 lb 12.8 oz (51.6 kg)  08/29/22 114 lb (51.7 kg)  08/16/22 114 lb 12.8 oz (52.1 kg)     Patient Active Problem List   Diagnosis  Date Noted   Chronic kidney disease, stage 3a ([place]) 08/16/2022    Priority: High   History of breast cancer 03/12/2017    Priority: High    Overview:  2005 -Breast Cancer - lumpectomy, no radiation or chemo  [place] is oncologist    History of endometrial cancer 02/02/2017    Priority: High   Essential hypertension 01/20/2011    Priority: High   Family history of breast cancer     Priority: Medium    Family history of prostate cancer     Priority: Medium    Family history of colon cancer     Priority: Medium    Colon polyp 01/14/2013    Priority: Medium     Overview:  q 5 year screens, Dr. Watt Climes    Recurrent UTI 01/14/2013    Priority: Medium    Osteoarthritis, multiple sites 10/25/2010    Priority: Medium     Overview:  Back, hand - chiropractor, Dr. Macario Golds    Osteoporosis 10/25/2010    Priority: Medium     Overview:  Bone Density 2012, HRT use in past x 15 years. Takes calcium and Vit D; daily weight bearing exercise Did not tolerate Actonel or boniva in past DEXA 2014 - T=-2.7; 12/2013 - Prolia discussed but pt declines due to cost of 177 per 6 months.  07/2014 - had long discussion regarding risks/benefits of treatment options. Pt declines all meds at this time including evista, biphosphanates and prolia/reclast./cla    Genetic testing 04/12/2017    Priority: Low    Negative genetic testing on the CancerNext panel.  The CancerNext gene panel offered by [employer] includes sequencing and rearrangement analysis for the following 32 genes:   APC, ATM, BARD1, BMPR1A, BRCA1, BRCA2, BRIP1, CDH1, CDK4, CDKN2A, CHEK2, EPCAM, GREM1, MLH1, MRE11A, MSH2, MSH6, MUTYH, NBN, NF1, PALB2, PMS2, POLD1, POLE, PTEN, RAD50, RAD51D, SMAD4, SMARCA4, STK11, and TP53.  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 Taken with the germline results demonstrating absence of pathogenic mutations or likely pathogenic variants (VLPs) in the mismatch repair (MMR) genes, the likelihood that this individual has Lynch syndrome/HNPCC is greatly decreased; however, the possibility of an undetected variant of either germline or somatic origin due to mosaicism and other rare etiologies cannot be ruled out by the current methodology. Correlation with clinical history and external tumor testing results is advised.    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    Priority: [place] Maintenance  Topic Date Due   Zoster Vaccines- Shingrix (1 of 2) 11/16/2022 (Originally 09/11/1991)   COVID-19 Vaccine (3 - 2023-24 season) 11/17/2022 (Originally 06/02/2022)   INFLUENZA VACCINE  12/31/2022 (Originally 05/02/2022)   MAMMOGRAM  01/01/2023   Medicare Annual Wellness (AWV)  08/30/2023  DTaP/Tdap/Td (3 - Td or Tdap) 01/30/2026   Pneumonia Vaccine 53+ Years old  Completed   HPV VACCINES  Aged Out   DEXA SCAN  Discontinued   Immunization History  Administered Date(s) Administered   Influenza Split 09/03/2008, 07/25/2010   Influenza, High Dose Seasonal PF 09/19/2014, 09/19/2014, 08/16/2015, 08/16/2015, 08/17/2016   Influenza, Seasonal, Injecte, Preservative Fre 07/29/2014   Influenza,trivalent, recombinat, inj, PF 07/24/2011   Janssen (J&J) SARS-COV-2 Vaccination 01/20/2021   Moderna Covid-19 Vaccine Bivalent Booster 66yr & up 11/28/2019   Pneumococcal Conjugate-13 07/29/2014, 09/19/2014   Pneumococcal Polysaccharide-23 07/29/2005   Tdap 10/25/2010, 01/31/2016   Zoster, Live 09/03/2008   We updated and reviewed the patient's past  history in detail and it is documented below. Allergies: Patient is allergic to lubricants, betadine [povidone iodine], and lisinopril. Past Medical History Patient  has a past medical history of Anemia, Arthritis, Cancer ([place], Cataract, Dyspnea, Family history of adverse reaction to anesthesia, Family history of breast cancer, Family history of colon cancer, Family history of prostate cancer, Family history of stomach cancer, Heart murmur, History of colon polyps, Hypertension, Occipital neuralgia, Osteoporosis, and Pneumonia. Past Surgical History Patient  has a past surgical history that includes Foot surgery; Tubal ligation; Colon surgery; Eye surgery; Fracture surgery; Wisdom tooth extraction; Breast lumpectomy; Robotic assisted lap vaginal hysterectomy (N/A, 02/08/2017); and Sentinel node biopsy (N/A, 02/08/2017). Family History: Patient family history includes Breast cancer in her cousin and paternal aunt; Cancer in her paternal aunt and paternal grandfather; Heart disease in her brother, father, maternal grandfather, and maternal grandmother; Prostate cancer in her paternal uncle; Stomach cancer in her paternal uncle; Stroke in her father and mother. Social History:  Patient  reports that she has never smoked. She has never used smokeless tobacco. She reports that she does not drink alcohol and does not use drugs.  Review of Systems: Constitutional: negative for fever or malaise Ophthalmic: negative for photophobia, double vision or loss of vision Cardiovascular: negative for chest pain, dyspnea on exertion, or new LE swelling Respiratory: negative for SOB or persistent cough Gastrointestinal: negative for abdominal pain, change in bowel habits or melena Genitourinary: negative for dysuria or gross hematuria, no abnormal uterine bleeding or disharge Musculoskeletal: negative for new gait disturbance or muscular weakness Integumentary: negative for new or persistent rashes, no breast  lumps Neurological: negative for TIA or stroke symptoms Psychiatric: negative for SI or delusions Allergic/Immunologic: negative for hives  Patient Care Team    Relationship Specialty Notifications Start End  ALeamon Arnt MD PCP - General Family Medicine  07/31/22   LTyson Dense MD Consulting Physician Obstetrics and Gynecology  04/17/18   REveritt Amber MD Consulting Physician Gynecologic Oncology  04/17/18   GToribio Harbour DBrocketReferring Physician Chiropractic Medicine  04/17/18   HMonna Fam MD Consulting Physician Ophthalmology  04/17/18   FAugustina Mood DPottawattamieReferring Physician Dentistry  04/17/18   LRolm Bookbinder MD Consulting Physician Dermatology  04/17/18     Objective  Vitals: BP 130/60   Pulse 71   Temp 97.9 F (36.6 C)   Ht '5\' 5"'$  (1.651 m)   Wt 113 lb 12.8 oz (51.6 kg)   SpO2 99%   BMI 18.94 kg/m  General:  Well developed, well nourished, no acute distress  Psych:  Alert and orientedx3,normal mood and affect HEENT:  Normocephalic, atraumatic, non-icteric sclera,  supple neck without adenopathy, mass or thyromegaly Cardiovascular:  Normal S1, S2, RRR without gallop, rub or murmur Respiratory:  Good breath sounds bilaterally,  CTAB with normal respiratory effort Gastrointestinal: normal bowel sounds, soft, non-tender, no noted masses. No HSM MSK: no deformities, contusions. Joints are without erythema or swelling.  Skin:  Warm, no rashes or suspicious lesions noted Neurologic:    Mental status is normal.Gross motor and sensory exams are normal. Normal gait. No tremor   Commons side effects, risks, benefits, and alternatives for medications and treatment plan prescribed today were discussed, and the patient expressed understanding of the given instructions. Patient is instructed to call or message via MyChart if he/she has any questions or concerns regarding our treatment plan. No barriers to understanding were identified. We discussed Red Flag symptoms and  signs in detail. Patient expressed understanding regarding what to do in case of urgent or emergency type symptoms.  Medication list was reconciled, printed and provided to the patient in AVS. Patient instructions and summary information was reviewed with the patient as documented in the AVS. This note was prepared with assistance of Dragon voice recognition software. Occasional wrong-word or sound-a-like substitutions may have occurred due to the inherent limitations of voice recognition software

## 2022-11-01 NOTE — Patient Instructions (Signed)
Please return in 6 months for hypertension follow up.   Please take the prescription for Shingrix to the pharmacy so they may administer the vaccinations. Your insurance will then cover the injections.   Your pneumonia shots are up to date.   I have refilled your blood pressure dose at the original level: 80/12.5 daily.  Stay well and call me if you need me.   Wt Readings from Last 3 Encounters:  11/01/22 113 lb 12.8 oz (51.6 kg)  08/29/22 114 lb (51.7 kg)  08/16/22 114 lb 12.8 oz (52.1 kg)     If you have any questions or concerns, please don't hesitate to send me a message via MyChart or call the office at [phone]. Thank you for visiting with [place] today! It's our pleasure caring for you.

## 2022-12-14 ENCOUNTER — Ambulatory Visit: Payer: Medicare PPO | Admitting: Family Medicine

## 2022-12-14 ENCOUNTER — Encounter: Payer: Self-pay | Admitting: Family Medicine

## 2022-12-14 VITALS — BP 132/60 | HR 79 | Temp 98.1°F | Ht 65.0 in | Wt 115.2 lb

## 2022-12-14 DIAGNOSIS — J208 Acute bronchitis due to other specified organisms: Secondary | ICD-10-CM | POA: Diagnosis not present

## 2022-12-14 DIAGNOSIS — R064 Hyperventilation: Secondary | ICD-10-CM | POA: Diagnosis not present

## 2022-12-14 DIAGNOSIS — I10 Essential (primary) hypertension: Secondary | ICD-10-CM

## 2022-12-14 DIAGNOSIS — R051 Acute cough: Secondary | ICD-10-CM | POA: Diagnosis not present

## 2022-12-14 DIAGNOSIS — B9689 Other specified bacterial agents as the cause of diseases classified elsewhere: Secondary | ICD-10-CM

## 2022-12-14 LAB — POC COVID19 BINAXNOW: SARS Coronavirus 2 Ag: NEGATIVE

## 2022-12-14 MED ORDER — AZITHROMYCIN 250 MG PO TABS: ORAL_TABLET | ORAL | 0 refills | Status: DC

## 2022-12-14 MED ORDER — BENZONATATE 200 MG PO CAPS: 200.0000 mg | ORAL_CAPSULE | Freq: Two times a day (BID) | ORAL | 0 refills | Status: DC | PRN

## 2022-12-14 MED ORDER — GUAIFENESIN-CODEINE 100-10 MG/5ML PO SOLN: 5.0000 mL | Freq: Four times a day (QID) | ORAL | 0 refills | Status: DC | PRN

## 2022-12-14 NOTE — Patient Instructions (Signed)
Please follow up if symptoms do not improve or as needed.    Acute Bronchitis, Adult  Acute bronchitis is sudden inflammation of the main airways (bronchi) that come off the windpipe (trachea) in the lungs. The swelling causes the airways to get smaller and make more mucus than normal. This can make it hard to breathe and can cause coughing or noisy breathing (wheezing). Acute bronchitis may last several weeks. The cough may last longer. Allergies, asthma, and exposure to smoke may make the condition worse. What are the causes? This condition can be caused by germs and by substances that irritate the lungs, including: Cold and flu viruses. The most common cause of this condition is the virus that causes the common cold. Bacteria. This is less common. Breathing in substances that irritate the lungs, including: Smoke from cigarettes and other forms of tobacco. Dust and pollen. Fumes from household cleaning products, gases, or burned fuel. Indoor or outdoor air pollution. What increases the risk? The following factors may make you more likely to develop this condition: A weak body's defense system, also called the immune system. A condition that affects your lungs and breathing, such as asthma. What are the signs or symptoms? Common symptoms of this condition include: Coughing. This may bring up clear, yellow, or green mucus from your lungs (sputum). Wheezing. Runny or stuffy nose. Having too much mucus in your lungs (chest congestion). Shortness of breath. Aches and pains, including sore throat or chest. How is this diagnosed? This condition is usually diagnosed based on: Your symptoms and medical history. A physical exam. You may also have other tests, including tests to rule out other conditions, such as pneumonia. These tests include: A test of lung function. Test of a mucus sample to look for the presence of bacteria. Tests to check the oxygen level in your blood. Blood  tests. Chest X-ray. How is this treated? Most cases of acute bronchitis clear up over time without treatment. Your health care provider may recommend: Drinking more fluids to help thin your mucus so it is easier to cough up. Taking inhaled medicine (inhaler) to improve air flow in and out of your lungs. Using a vaporizer or a humidifier. These are machines that add water to the air to help you breathe better. Taking a medicine that thins mucus and clears congestion (expectorant). Taking a medicine that prevents or stops coughing (cough suppressant). It is not common to take an antibiotic medicine for this condition. Follow these instructions at home:  Take over-the-counter and prescription medicines only as told by your health care provider. Use an inhaler, vaporizer, or humidifier as told by your health care provider. Take two teaspoons (10 mL) of honey at bedtime to lessen coughing at night. Drink enough fluid to keep your urine pale yellow. Do not use any products that contain nicotine or tobacco. These products include cigarettes, chewing tobacco, and vaping devices, such as e-cigarettes. If you need help quitting, ask your health care provider. Get plenty of rest. Return to your normal activities as told by your health care provider. Ask your health care provider what activities are safe for you. Keep all follow-up visits. This is important. How is this prevented? To lower your risk of getting this condition again: Wash your hands often with soap and water for at least 20 seconds. If soap and water are not available, use hand sanitizer. Avoid contact with people who have cold symptoms. Try not to touch your mouth, nose, or eyes with your hands.   Avoid breathing in smoke or chemical fumes. Breathing smoke or chemical fumes will make your condition worse. Get the flu shot every year. Contact a health care provider if: Your symptoms do not improve after 2 weeks. You have trouble  coughing up the mucus. Your cough keeps you awake at night. You have a fever. Get help right away if you: Cough up blood. Feel pain in your chest. Have severe shortness of breath. Faint or keep feeling like you are going to faint. Have a severe headache. Have a fever or chills that get worse. These symptoms may represent a serious problem that is an emergency. Do not wait to see if the symptoms will go away. Get medical help right away. Call your local emergency services (911 in the [place] Do not drive yourself to the hospital. Summary Acute bronchitis is inflammation of the main airways (bronchi) that come off the windpipe (trachea) in the lungs. The swelling causes the airways to get smaller and make more mucus than normal. Drinking more fluids can help thin your mucus so it is easier to cough up. Take over-the-counter and prescription medicines only as told by your health care provider. Do not use any products that contain nicotine or tobacco. These products include cigarettes, chewing tobacco, and vaping devices, such as e-cigarettes. If you need help quitting, ask your health care provider. Contact a health care provider if your symptoms do not improve after 2 weeks. This information is not intended to replace advice given to you by your health care provider. Make sure you discuss any questions you have with your health care provider. Document Revised: 12/29/2021 Document Reviewed: 01/19/2021 Elsevier Patient Education  2023 [employer]  

## 2022-12-14 NOTE — Progress Notes (Signed)
Subjective  CC:  Chief Complaint  Patient presents with   Cough    Pt stated that she has been coughing for the past week. Productive cough, green phlegm, hoarse.     HPI: SUBJECTIVE:  Gloria Cummings is a 82 y.o. female who complains of extreme fatigue, congestion, nasal blockage, post nasal drip, cough described as productive of green sputum "thick green like pudding" and denies sinus, high fevers,  chest pain or significant GI symptoms. Symptoms have been present for 8 days. She denies a history of anorexia, dizziness, vomiting and wheezing. She denies a history of asthma or COPD. Patient does not smoke cigarettes. No ST or headache. She is eating and drinking well. Just has no energy and having significant coughing spells.   Htn: feels lightheaded in office today. On Micardis-HCT 80/12.5 daily. No palpitations or cp.   Assessment  1. Acute bacterial bronchitis   2. Acute cough   3. Hyperventilating   4. Essential hypertension      Plan  Discussion:  Treat for bacterial bronchitis due to prolonged course and worsening symptoms - new thick purulent sputum. Zpak and supportive cough meds.Education regarding differences between viral and bacterial infections and treatment options are discussed.  Supportive care measures are recommended.  We discussed the use of mucolytic's, decongestants, antihistamines and antitussives as needed.  Tylenol or Advil are recommended if needed.  Discussed hyperventilating with coughing spasms: once she calmed down the lightheadedness resolved.   HTN: normotensive on meds. Well hydrated. Bp was mildly elevated with cough spell and during sx of lightheadedness. Continue medication.  Follow up: as scheduled for f/u in july   Orders Placed This Encounter  Procedures   POC COVID-19   Meds ordered this encounter  Medications   azithromycin (ZITHROMAX) 250 MG tablet    Sig: Take 2 tabs today, then 1 tab daily for 4 days    Dispense:  1 each     Refill:  0   guaiFENesin-codeine 100-10 MG/5ML syrup    Sig: Take 5 mLs by mouth every 6 (six) hours as needed for cough.    Dispense:  120 mL    Refill:  0   benzonatate (TESSALON) 200 MG capsule    Sig: Take 1 capsule (200 mg total) by mouth 2 (two) times daily as needed for cough.    Dispense:  20 capsule    Refill:  0      I reviewed the patients updated PMH, FH, and SocHx.  Social History: Patient  reports that she has never smoked. She has never used smokeless tobacco. She reports that she does not drink alcohol and does not use drugs.  Patient Active Problem List   Diagnosis Date Noted   Chronic kidney disease, stage 3a ([place]) 08/16/2022    Priority: High   History of breast cancer 03/12/2017    Priority: High   History of endometrial cancer 02/02/2017    Priority: High   Essential hypertension 01/20/2011    Priority: High   Family history of breast cancer     Priority: Medium    Family history of prostate cancer     Priority: Medium    Family history of colon cancer     Priority: Medium    Colon polyp 01/14/2013    Priority: Medium    Recurrent UTI 01/14/2013    Priority: Medium    Osteoarthritis, multiple sites 10/25/2010    Priority: Medium    Osteoporosis 10/25/2010    Priority:  Medium    Genetic testing 04/12/2017    Priority: Low   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   azithromycin (ZITHROMAX) 250 MG tablet Take 2 tabs today, then 1 tab daily for 4 days   benzonatate (TESSALON) 200 MG capsule Take 1 capsule (200 mg total) by mouth 2 (two) times daily as needed for cough.   calcium carbonate (TUMS EX) 750 MG chewable tablet Chew 1  tablet by mouth daily. Dc 3 2000   Cholecalciferol (VITAMIN D) 2000 units tablet Take 2,000 Units by mouth daily.   fish oil-omega-3 fatty acids 1000 MG capsule Take 4 g by mouth daily.    guaiFENesin-codeine 100-10 MG/5ML syrup Take 5 mLs by mouth every 6 (six) hours as needed for cough.   Multiple Vitamins-Minerals (MULTIVITAMIN WITH MINERALS) tablet Take 1 tablet by mouth daily.   Red Yeast Rice Extract 600 MG CAPS Take 1 capsule by mouth daily.   telmisartan-hydrochlorothiazide (MICARDIS HCT) 80-12.5 MG tablet Take 1 tablet by mouth daily.    Objective  Vitals: BP 132/60   Pulse 79   Temp 98.1 F (36.7 C)   Ht '5\' 5"'$  (1.651 m)   Wt 115 lb 3.2 oz (52.3 kg)   SpO2 99%   BMI 19.17 kg/m  General: no acute distress when calm; having dry coughing spells in room and breaths rapidly afterwards.  Psych:  Alert and oriented, normal mood and affect HEENT:  Normocephalic, atraumatic, supple neck, moist mucous membranes Cardiovascular:  RRR without murmur. no edema Respiratory:  Good breath sounds bilaterally, CTAB with normal respiratory effort , no tachypnea when calm and no retractions Skin:  Warm, no rashes Neurologic:   Mental status is normal.  Office Visit on 12/14/2022  Component Date Value Ref Range Status   SARS Coronavirus 2 Ag 12/14/2022 Negative  Negative Final    Commons side effects, risks, benefits, and alternatives for medications and treatment plan prescribed today were discussed, and the patient expressed understanding of the given instructions. Patient is instructed to call or message via MyChart if he/she has any questions or concerns regarding our treatment plan. No barriers to understanding were identified. We discussed Red Flag symptoms and signs in detail. Patient expressed understanding regarding what to do in case of urgent or emergency type symptoms.  Medication list was reconciled, printed and provided to the patient in AVS. Patient instructions and summary  information was reviewed with the patient as documented in the AVS. This note was prepared with assistance of Dragon voice recognition software. Occasional wrong-word or sound-a-like substitutions may have occurred due to the inherent limitations of voice recognition software

## 2023-01-11 DIAGNOSIS — H43813 Vitreous degeneration, bilateral: Secondary | ICD-10-CM | POA: Diagnosis not present

## 2023-01-11 DIAGNOSIS — H35033 Hypertensive retinopathy, bilateral: Secondary | ICD-10-CM | POA: Diagnosis not present

## 2023-01-11 DIAGNOSIS — H26491 Other secondary cataract, right eye: Secondary | ICD-10-CM | POA: Diagnosis not present

## 2023-01-11 DIAGNOSIS — H40013 Open angle with borderline findings, low risk, bilateral: Secondary | ICD-10-CM | POA: Diagnosis not present

## 2023-02-21 DIAGNOSIS — Z1231 Encounter for screening mammogram for malignant neoplasm of breast: Secondary | ICD-10-CM | POA: Diagnosis not present

## 2023-02-21 LAB — HM MAMMOGRAPHY

## 2023-02-23 ENCOUNTER — Encounter: Payer: Self-pay | Admitting: Family Medicine

## 2023-04-30 ENCOUNTER — Encounter: Payer: Self-pay | Admitting: Family Medicine

## 2023-04-30 ENCOUNTER — Ambulatory Visit: Payer: Medicare PPO | Admitting: Family Medicine

## 2023-04-30 VITALS — BP 150/74 | HR 65 | Temp 97.9°F | Ht 65.0 in | Wt 119.0 lb

## 2023-04-30 DIAGNOSIS — N1831 Chronic kidney disease, stage 3a: Secondary | ICD-10-CM

## 2023-04-30 DIAGNOSIS — I10 Essential (primary) hypertension: Secondary | ICD-10-CM

## 2023-04-30 DIAGNOSIS — E782 Mixed hyperlipidemia: Secondary | ICD-10-CM | POA: Diagnosis not present

## 2023-04-30 LAB — CBC WITH DIFFERENTIAL/PLATELET
Basophils Absolute: 0 10*3/uL (ref 0.0–0.1)
Basophils Relative: 0.7 % (ref 0.0–3.0)
Eosinophils Absolute: 0.1 10*3/uL (ref 0.0–0.7)
Eosinophils Relative: 2.7 % (ref 0.0–5.0)
HCT: 39.3 % (ref 36.0–46.0)
Hemoglobin: 13.3 g/dL (ref 12.0–15.0)
Lymphocytes Relative: 33 % (ref 12.0–46.0)
Lymphs Abs: 1.3 10*3/uL (ref 0.7–4.0)
MCHC: 33.9 g/dL (ref 30.0–36.0)
MCV: 92.9 fl (ref 78.0–100.0)
Monocytes Absolute: 0.3 10*3/uL (ref 0.1–1.0)
Monocytes Relative: 7.8 % (ref 3.0–12.0)
Neutro Abs: 2.2 10*3/uL (ref 1.4–7.7)
Neutrophils Relative %: 55.8 % (ref 43.0–77.0)
Platelets: 228 10*3/uL (ref 150.0–400.0)
RBC: 4.23 Mil/uL (ref 3.87–5.11)
RDW: 12.6 % (ref 11.5–15.5)
WBC: 3.9 10*3/uL — ABNORMAL LOW (ref 4.0–10.5)

## 2023-04-30 LAB — COMPREHENSIVE METABOLIC PANEL
ALT: 17 U/L (ref 0–35)
AST: 24 U/L (ref 0–37)
Albumin: 4.5 g/dL (ref 3.5–5.2)
Alkaline Phosphatase: 57 U/L (ref 39–117)
BUN: 22 mg/dL (ref 6–23)
CO2: 31 mEq/L (ref 19–32)
Calcium: 10.8 mg/dL — ABNORMAL HIGH (ref 8.4–10.5)
Chloride: 98 mEq/L (ref 96–112)
Creatinine, Ser: 1.15 mg/dL (ref 0.40–1.20)
GFR: 44.02 mL/min — ABNORMAL LOW (ref >60.00)
Glucose, Bld: 90 mg/dL (ref 70–99)
Potassium: 3.8 mEq/L (ref 3.5–5.1)
Sodium: 138 mEq/L (ref 135–145)
Total Bilirubin: 0.6 mg/dL (ref 0.2–1.2)
Total Protein: 6.9 g/dL (ref 6.0–8.3)

## 2023-04-30 LAB — LIPID PANEL
Cholesterol: 230 mg/dL — ABNORMAL HIGH (ref 0–200)
HDL: 80.8 mg/dL (ref >39.00)
LDL Cholesterol: 132 mg/dL — ABNORMAL HIGH (ref 0–99)
NonHDL: 148.95
Total CHOL/HDL Ratio: 3
Triglycerides: 86 mg/dL (ref 0.0–149.0)
VLDL: 17.2 mg/dL (ref 0.0–40.0)

## 2023-04-30 MED ORDER — TELMISARTAN-HCTZ 80-25 MG PO TABS: 1.0000 | ORAL_TABLET | Freq: Every day | ORAL | 3 refills | Status: DC

## 2023-04-30 NOTE — Progress Notes (Signed)
Subjective  CC:  Chief Complaint  Patient presents with   Hypertension    HPI: Gloria Cummings is a 82 y.o. female who presents to the office today to address the problems listed above in the chief complaint. Hypertension f/u: feels well. Dancing and active. Pt reports she is doing well. taking medications as instructed, no medication side effects noted, no TIAs, no chest pain on exertion, no dyspnea on exertion, no swelling of ankles. Doesn't check bp at home. Elevated in office today. Weight is up a few more pounds; eating fairly well. Mood is stable. She denies adverse effects from his BP medications. Compliance with medication is good.  CKD: due for recheck. No symptoms of volume overload.  H/o hypercalcemia in November w/ nl PTH. No sxs H/o HLD. Healthy diet.   Assessment  1. Essential hypertension   2. Chronic kidney disease, stage 3a (HCC)   3. Hypercalcemia   4. Mixed hyperlipidemia      Plan   Hypertension f/u: BP control is fairly well controlled. Will increase dose of telsmisartan hct to 80.25 daily. Education and counseling done. Check renal function and electrolytes. Check lipids Recheck calcium Monitor renal function.   Education regarding management of these chronic disease states was given. Management strategies discussed on successive visits include dietary and exercise recommendations, goals of achieving and maintaining IBW, and lifestyle modifications aiming for adequate sleep and minimizing stressors.   Follow up: 8 weeks to recheck bp   Orders Placed This Encounter  Procedures   Lipid panel   Comprehensive metabolic panel   CBC with Differential/Platelet   Meds ordered this encounter  Medications   telmisartan-hydrochlorothiazide (MICARDIS HCT) 80-25 MG tablet    Sig: Take 1 tablet by mouth daily.    Dispense:  90 tablet    Refill:  3      BP Readings from Last 3 Encounters:  04/30/23 (!) 150/74  12/14/22 132/60  11/01/22 130/60   Wt  Readings from Last 3 Encounters:  04/30/23 119 lb (54 kg)  12/14/22 115 lb 3.2 oz (52.3 kg)  11/01/22 113 lb 12.8 oz (51.6 kg)    No results found for: "CHOL" No results found for: "HDL" No results found for: "LDLCALC" No results found for: "TRIG" No results found for: "CHOLHDL" No results found for: "LDLDIRECT" Lab Results  Component Value Date   CREATININE 1.29 (H) 08/02/2022   BUN 27 (H) 08/02/2022   NA 138 08/02/2022   K 4.6 08/02/2022   CL 100 08/02/2022   CO2 31 08/02/2022    The ASCVD Risk score (Arnett DK, et al., 2019) failed to calculate for the following reasons:   The 2019 ASCVD risk score is only valid for ages 17 to 12  I reviewed the patients updated PMH, FH, and SocHx.    Patient Active Problem List   Diagnosis Date Noted   Chronic kidney disease, stage 3a (HCC) 08/16/2022    Priority: High   History of breast cancer 03/12/2017    Priority: High   History of endometrial cancer 02/02/2017    Priority: High   Essential hypertension 01/20/2011    Priority: High   Family history of breast cancer     Priority: Medium    Family history of prostate cancer     Priority: Medium    Family history of colon cancer     Priority: Medium    Colon polyp 01/14/2013    Priority: Medium    Recurrent UTI 01/14/2013  Priority: Medium    Osteoarthritis, multiple sites 10/25/2010    Priority: Medium    Osteoporosis 10/25/2010    Priority: Medium    Genetic testing 04/12/2017    Priority: Low   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 She has never used smokeless tobacco. She reports that she does not drink alcohol and does not use drugs.  Current Meds  Medication Sig   aspirin EC 81 MG tablet Take 81 mg by mouth daily.   calcium carbonate (TUMS EX) 750 MG chewable tablet Chew 1 tablet by mouth daily. Dc  3 2000   Cholecalciferol (VITAMIN D) 2000 units tablet Take 2,000 Units by mouth daily.   fish oil-omega-3 fatty acids 1000 MG capsule Take 4 g by mouth daily.    Multiple Vitamins-Minerals (MULTIVITAMIN WITH MINERALS) tablet Take 1 tablet by mouth daily.   Red Yeast Rice Extract 600 MG CAPS Take 1 capsule by mouth daily.   telmisartan-hydrochlorothiazide (MICARDIS HCT) 80-25 MG tablet Take 1 tablet by mouth daily.   [DISCONTINUED] telmisartan-hydrochlorothiazide (MICARDIS HCT) 80-12.5 MG tablet Take 1 tablet by mouth daily.    Review of Systems: Cardiovascular: negative for chest pain, palpitations, leg swelling, orthopnea Respiratory: negative for SOB, wheezing or persistent cough Gastrointestinal: negative for abdominal pain Genitourinary: negative for dysuria or gross hematuria  Objective  Vitals: BP (!) 150/74   Pulse 65   Temp 97.9 F (36.6 C)   Ht 5\' 5"  (1.651 m)   Wt 119 lb (54 kg)   SpO2 97%   BMI 19.80 kg/m  General: no acute distress  Psych:  Alert and oriented, normal mood and affect HEENT:  Normocephalic, atraumatic, supple neck  Cardiovascular:  RRR withw/ systolic murmur. No edema. Respiratory:  Good breath sounds bilaterally, CTAB with normal respiratory effort Skin:  Warm, no rashes Neurologic:   Mental status is normal Commons side effects, risks, benefits, and alternatives for medications and treatment plan prescribed today were discussed, and the patient expressed understanding of the given instructions. Patient is instructed to call or message via MyChart if he/she has any questions or concerns regarding our treatment plan. No barriers to understanding were identified. We discussed Red Flag symptoms and signs in detail. Patient expressed understanding regarding what to do in case of urgent or emergency type symptoms.  Medication list was reconciled, printed and provided to the patient in AVS. Patient instructions and summary information was reviewed with the  patient as documented in the AVS. This note was prepared with assistance of Dragon voice recognition software. Occasional wrong-word or sound-a-like substitutions may have occurred due to the inherent limitation

## 2023-04-30 NOTE — Patient Instructions (Signed)
Please return in 2 months to recheck your blood pressure  If you have any questions or concerns, please don't hesitate to send me a message via MyChart or call the office at [phone]. Thank you for visiting with [place] today! It's our pleasure caring for you.

## 2023-05-01 NOTE — Progress Notes (Signed)
Please call patient: these lab results are all stable. Mild abnormalties are present but nothing that requires changes now. Calcium remains a little high so need to recheck in 3 months.

## 2023-05-14 DIAGNOSIS — D225 Melanocytic nevi of trunk: Secondary | ICD-10-CM | POA: Diagnosis not present

## 2023-05-14 DIAGNOSIS — L603 Nail dystrophy: Secondary | ICD-10-CM | POA: Diagnosis not present

## 2023-05-14 DIAGNOSIS — D692 Other nonthrombocytopenic purpura: Secondary | ICD-10-CM | POA: Diagnosis not present

## 2023-05-14 DIAGNOSIS — D2272 Melanocytic nevi of left lower limb, including hip: Secondary | ICD-10-CM | POA: Diagnosis not present

## 2023-05-14 DIAGNOSIS — D2262 Melanocytic nevi of left upper limb, including shoulder: Secondary | ICD-10-CM | POA: Diagnosis not present

## 2023-05-14 DIAGNOSIS — L821 Other seborrheic keratosis: Secondary | ICD-10-CM | POA: Diagnosis not present

## 2023-05-14 DIAGNOSIS — D2239 Melanocytic nevi of other parts of face: Secondary | ICD-10-CM | POA: Diagnosis not present

## 2023-05-14 DIAGNOSIS — L720 Epidermal cyst: Secondary | ICD-10-CM | POA: Diagnosis not present

## 2023-05-14 DIAGNOSIS — L918 Other hypertrophic disorders of the skin: Secondary | ICD-10-CM | POA: Diagnosis not present

## 2023-06-13 ENCOUNTER — Telehealth: Payer: Self-pay | Admitting: Family Medicine

## 2023-06-13 NOTE — Telephone Encounter (Signed)
Per patient she has spoken w/ provider and will schedule for a 2 mo f/u

## 2023-06-13 NOTE — Telephone Encounter (Signed)
Per patient she has only been taking telmisartan-hydrochlorothiazide (MICARDIS HCT) 80-25 MG tablet for two weeks . States she was still taking 80-12.5 mg before this . Would like to know if bp f/u on 9/30 is needed or if  she should wait till she has been on 80-25mg  medication longer . Please advise . Call back number is [phone]

## 2023-07-02 ENCOUNTER — Ambulatory Visit: Payer: Medicare PPO | Admitting: Family Medicine

## 2023-07-30 DIAGNOSIS — L821 Other seborrheic keratosis: Secondary | ICD-10-CM | POA: Diagnosis not present

## 2023-07-30 DIAGNOSIS — D692 Other nonthrombocytopenic purpura: Secondary | ICD-10-CM | POA: Diagnosis not present

## 2023-07-30 DIAGNOSIS — L570 Actinic keratosis: Secondary | ICD-10-CM | POA: Diagnosis not present

## 2023-08-03 ENCOUNTER — Encounter: Payer: Self-pay | Admitting: Family Medicine

## 2023-08-03 ENCOUNTER — Ambulatory Visit: Payer: Medicare PPO | Admitting: Family Medicine

## 2023-08-03 VITALS — BP 158/60 | HR 64 | Temp 98.0°F | Ht 65.0 in | Wt 119.0 lb

## 2023-08-03 DIAGNOSIS — R252 Cramp and spasm: Secondary | ICD-10-CM

## 2023-08-03 DIAGNOSIS — I10 Essential (primary) hypertension: Secondary | ICD-10-CM | POA: Diagnosis not present

## 2023-08-03 DIAGNOSIS — N1831 Chronic kidney disease, stage 3a: Secondary | ICD-10-CM

## 2023-08-03 DIAGNOSIS — R0602 Shortness of breath: Secondary | ICD-10-CM

## 2023-08-03 DIAGNOSIS — R42 Dizziness and giddiness: Secondary | ICD-10-CM

## 2023-08-03 DIAGNOSIS — I951 Orthostatic hypotension: Secondary | ICD-10-CM

## 2023-08-03 LAB — CBC WITH DIFFERENTIAL/PLATELET
Basophils Absolute: 0 10*3/uL (ref 0.0–0.1)
Basophils Relative: 0.6 % (ref 0.0–3.0)
Eosinophils Absolute: 0.1 10*3/uL (ref 0.0–0.7)
Eosinophils Relative: 1.5 % (ref 0.0–5.0)
HCT: 39.7 % (ref 36.0–46.0)
Hemoglobin: 13.2 g/dL (ref 12.0–15.0)
Lymphocytes Relative: 31.2 % (ref 12.0–46.0)
Lymphs Abs: 1.3 10*3/uL (ref 0.7–4.0)
MCHC: 33.2 g/dL (ref 30.0–36.0)
MCV: 93.8 fL (ref 78.0–100.0)
Monocytes Absolute: 0.3 10*3/uL (ref 0.1–1.0)
Monocytes Relative: 6.9 % (ref 3.0–12.0)
Neutro Abs: 2.6 10*3/uL (ref 1.4–7.7)
Neutrophils Relative %: 59.8 % (ref 43.0–77.0)
Platelets: 249 10*3/uL (ref 150.0–400.0)
RBC: 4.23 Mil/uL (ref 3.87–5.11)
RDW: 12.8 % (ref 11.5–15.5)
WBC: 4.3 10*3/uL (ref 4.0–10.5)

## 2023-08-03 LAB — TSH: TSH: 3.3 u[IU]/mL (ref 0.35–5.50)

## 2023-08-03 LAB — COMPREHENSIVE METABOLIC PANEL
ALT: 20 U/L (ref 0–35)
AST: 28 U/L (ref 0–37)
Albumin: 4.6 g/dL (ref 3.5–5.2)
Alkaline Phosphatase: 62 U/L (ref 39–117)
BUN: 27 mg/dL — ABNORMAL HIGH (ref 6–23)
CO2: 32 meq/L (ref 19–32)
Calcium: 10.7 mg/dL — ABNORMAL HIGH (ref 8.4–10.5)
Chloride: 98 meq/L (ref 96–112)
Creatinine, Ser: 1.14 mg/dL (ref 0.40–1.20)
GFR: 44.4 mL/min — ABNORMAL LOW (ref >60.00)
Glucose, Bld: 91 mg/dL (ref 70–99)
Potassium: 4.1 meq/L (ref 3.5–5.1)
Sodium: 138 meq/L (ref 135–145)
Total Bilirubin: 0.5 mg/dL (ref 0.2–1.2)
Total Protein: 7.3 g/dL (ref 6.0–8.3)

## 2023-08-03 LAB — MAGNESIUM: Magnesium: 2.1 mg/dL (ref 1.5–2.5)

## 2023-08-03 MED ORDER — TELMISARTAN-HCTZ 80-12.5 MG PO TABS: 1.0000 | ORAL_TABLET | Freq: Every day | ORAL | Status: DC

## 2023-08-03 NOTE — Progress Notes (Signed)
Subjective  CC:  Chief Complaint  Patient presents with   Hypertension    HPI: Gloria Cummings is a 82 y.o. female who presents to the office today to address the problems listed above in the chief complaint.  The patient, with a history of hypertension, presented with concerns about their blood pressure readings and a new onset of symptoms since starting a higher dose of their antihypertensive medication. The patient reported feeling breathless frequently, especially when stooping or bending over, which they initially attributed to their blood pressure medication. They also reported feeling dizzy and weak, with a lack of energy compared to their usual active lifestyle, which includes dancing.  The patient also reported experiencing leg cramps, a new symptom for them, which they managed with a magnesium oil spray. They noted a decrease in the strength of their urine stream and an increase in nocturia, but these symptoms were present before the medication change and have not significantly worsened.  In addition to these symptoms, the patient has been dealing with a persistent skin issue on their nose, which has been treated twice by a dermatologist with cryosurgery but has not resolved. They expressed concern about the effectiveness of their current dermatological care.  The patient also mentioned a history of a bad reaction to the COVID-19 vaccine, which caused breathlessness and a tendency to hold their breath, especially when focusing on tasks. They reported a similar pattern of breath-holding and gasping for breath currently, which they wondered might be stress-related.  The patient has been diligent in monitoring their blood pressure at home and at local pharmacies, but they expressed uncertainty about the accuracy of their readings. They have been on a 25mg  dose of their antihypertensive medication since September, and all their blood pressure readings since then have been while on this  medication.  The patient has a history of COVID-19 infection and has received two shingles shots and a [employer] COVID-19 vaccine. They are currently not on any other medications, but they mentioned that they had a supply of their previous 12.5mg  dose of antihypertensive medication.  Assessment  1. Vertigo   2. Essential hypertension   3. Chronic kidney disease, stage 3a (HCC)   4. SOB (shortness of breath)   5. Lightheadedness   6. Leg cramps   7. Orthostatic hypotension      Plan  Positional Vertigo Dizziness and breathlessness likely due to positional vertigo. Positive Hallpike maneuver confirms diagnosis. Symptoms include dizziness upon head movement, leading to breathlessness and anxiety. Discussed treatment options including medication to alleviate dizziness and referral to neuro rehab for physical therapy. Patient declined medication. - Refer to neuro rehab for physical therapy - Follow up in 4-6 weeks  Hypertension w/ orthostatic hyptension Hypertension managed with telmisartan-HCTZ 80/25 mg since September 10. Blood pressure readings have been inconsistent, with some high readings noted. Reports dizziness and breathlessness, which may be related to blood pressure fluctuations or other causes. Symptoms include leg cramps, breathlessness, dizziness, and sweating. Discussed reverting to HCTZ 12.5 mg to manage symptoms and monitor blood pressure more accurately.Pt strongly links her sxs to the change in the bp dose.  - Revert to telmisartan-HCTZ 80/12.5 mg for pt preference. Continue to check home readings. BP readings today limited by vertigo and anxiety response. May have autonomic dysfunction. Goal bp 140s/90s. - Monitor blood pressure regularly - Order blood tests for potassium and magnesium levels  Leg Cramps New onset of leg cramps, possibly related to HCTZ. Symptoms include cramps  in calves and toes. Discussed checking potassium and magnesium levels and using tonic  water with quinine as a potential remedy. - Check potassium and magnesium levels - Recommend tonic water with quinine  Skin Lesion Persistent lesion on the nose treated twice with cryosurgery. The lesion has not resolved and may require excision. Discussed potential need for excision if lesion does not improve. - Follow-up with dermatologist for potential excision  General Health Maintenance Received two shingles shots. Declined COVID-19 vaccine due to previous adverse reaction. Considering flu shot. Discussed benefits of flu shot and plans to get it at a later date. - Administer flu shot at a later date due to current sxs.  I spent a total of 52 minutes for this patient encounter. Time spent included preparation, face-to-face counseling with the patient and coordination of care, review of chart and records, and documentation of the encounter.  Follow-up - Schedule follow-up appointment in 4-6 weeks   Orders Placed This Encounter  Procedures   CBC with Differential/Platelet   Comprehensive metabolic panel   TSH   Magnesium   Ambulatory Referral to Neuro Rehab   Meds ordered this encounter  Medications   telmisartan-hydrochlorothiazide (MICARDIS HCT) 80-12.5 MG tablet    Sig: Take 1 tablet by mouth daily.      I reviewed the patients updated PMH, FH, and SocHx.    Patient Active Problem List   Diagnosis Date Noted   Chronic kidney disease, stage 3a (HCC) 08/16/2022    Priority: High   History of breast cancer 03/12/2017    Priority: High   History of endometrial cancer 02/02/2017    Priority: High   Essential hypertension 01/20/2011    Priority: High   Family history of breast cancer     Priority: Medium    Family history of prostate cancer     Priority: Medium    Family history of colon cancer     Priority: Medium    Colon polyp 01/14/2013    Priority: Medium    Recurrent UTI 01/14/2013    Priority: Medium    Osteoarthritis, multiple sites 10/25/2010     Priority: Medium    Osteoporosis 10/25/2010    Priority: Medium    Genetic testing 04/12/2017    Priority: Low   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    Priority: Low   Current Meds  Medication Sig   aspirin EC 81 MG tablet Take 81 mg by mouth daily.   calcium carbonate (TUMS EX) 750 MG chewable tablet Chew 1 tablet by mouth daily. Dc 3 2000   Cholecalciferol (VITAMIN D) 2000 units tablet Take 2,000 Units by mouth daily.   fish oil-omega-3 fatty acids 1000 MG capsule Take 4 g by mouth daily.    Multiple Vitamins-Minerals (MULTIVITAMIN WITH MINERALS) tablet Take 1 tablet by mouth daily.   Red Yeast Rice Extract 600 MG CAPS Take 1 capsule by mouth daily.   telmisartan-hydrochlorothiazide (MICARDIS HCT) 80-12.5 MG tablet Take 1 tablet by mouth daily.   [DISCONTINUED] telmisartan-hydrochlorothiazide (MICARDIS HCT) 80-25 MG tablet Take 1 tablet by mouth daily.    Allergies: Patient is allergic to lubricants, betadine [povidone iodine], and lisinopril. Family History: Patient family history includes Breast cancer in her cousin and paternal aunt; Cancer in her paternal aunt and paternal grandfather; Heart disease in her brother, father, maternal grandfather, and maternal grandmother; Prostate cancer in her paternal uncle; Stomach cancer in her paternal uncle; Stroke in her father and mother. Social History:  Patient  reports that  she has never smoked. She has never used smokeless tobacco. She reports that she does not drink alcohol and does not use drugs.  Review of Systems: Constitutional: Negative for fever malaise or anorexia Cardiovascular: negative for chest pain Respiratory: + for SOB neg  persistent cough Gastrointestinal: negative for abdominal pain  Objective  Vitals: BP (!) 158/60   Pulse 64   Temp 98 F (36.7 C)   Ht 5\' 5"  (1.651 m)   Wt 119 lb (54 kg)   SpO2 98%   BMI 19.80 kg/m  + orthostatic hypotension. Systolic bp dropped 10 points. General: no acute  distress , A&Ox3 However, became very dizzy and short of breath after orthostatic blood pressures were taken, tachypneic Neuro: Positive Hallpike maneuver on left, reproducing symptoms.  Patient again became very dizzy, short of breath, tachypnea, closing her eyes.  When she sat still, all symptoms resolved. HEENT: PEERL, conjunctiva normal, neck is supple Cardiovascular:  RRR without murmur or gallop.  Respiratory:  Good breath sounds bilaterally, CTAB with normal respiratory effort Skin:  Warm, no rashes  Commons side effects, risks, benefits, and alternatives for medications and treatment plan prescribed today were discussed, and the patient expressed understanding of the given instructions. Patient is instructed to call or message via MyChart if he/she has any questions or concerns regarding our treatment plan. No barriers to understanding were identified. We discussed Red Flag symptoms and signs in detail. Patient expressed understanding regarding what to do in case of urgent or emergency type symptoms.  Medication list was reconciled, printed and provided to the patient in AVS. Patient instructions and summary information was reviewed with the patient as documented in the AVS. This note was prepared with assistance of Dragon voice recognition software. Occasional wrong-word or sound-a-like substitutions may have occurred due to the inherent limitations of voice recognition software    History of Present Illness   Assessment & Plan

## 2023-08-09 NOTE — Progress Notes (Signed)
Please call patient:Please see how she is doing?is she still having the vertigo? Does she want to try the medication to help since her PT appt is still a few weeks away?  Lab work results are all fine.

## 2023-08-28 ENCOUNTER — Ambulatory Visit: Payer: Medicare PPO

## 2023-09-03 ENCOUNTER — Encounter: Payer: Self-pay | Admitting: Family Medicine

## 2023-09-03 ENCOUNTER — Ambulatory Visit (INDEPENDENT_AMBULATORY_CARE_PROVIDER_SITE_OTHER): Payer: Medicare PPO

## 2023-09-03 ENCOUNTER — Ambulatory Visit (INDEPENDENT_AMBULATORY_CARE_PROVIDER_SITE_OTHER): Payer: Medicare PPO | Admitting: Family Medicine

## 2023-09-03 VITALS — BP 156/56 | HR 79 | Temp 98.0°F | Ht 65.0 in | Wt 122.4 lb

## 2023-09-03 VITALS — Wt 119.0 lb

## 2023-09-03 DIAGNOSIS — Z23 Encounter for immunization: Secondary | ICD-10-CM

## 2023-09-03 DIAGNOSIS — H8113 Benign paroxysmal vertigo, bilateral: Secondary | ICD-10-CM

## 2023-09-03 DIAGNOSIS — Z0000 Encounter for general adult medical examination without abnormal findings: Secondary | ICD-10-CM

## 2023-09-03 DIAGNOSIS — N1831 Chronic kidney disease, stage 3a: Secondary | ICD-10-CM | POA: Diagnosis not present

## 2023-09-03 DIAGNOSIS — I10 Essential (primary) hypertension: Secondary | ICD-10-CM | POA: Diagnosis not present

## 2023-09-03 NOTE — Patient Instructions (Signed)
Gloria Cummings , Thank you for taking time to come for your Medicare Wellness Visit. I appreciate your ongoing commitment to your health goals. Please review the following plan we discussed and let me know if I can assist you in the future.   Referrals/Orders/Follow-Ups/Clinician Recommendations: Aim for 30 minutes of exercise or brisk walking, 6-8 glasses of water, and 5 servings of fruits and vegetables each day. Maintain health and contnue good eating habits   This is a list of the screening recommended for you and due dates:  Health Maintenance  Topic Date Due   Flu Shot  05/03/2023   COVID-19 Vaccine (3 - 2023-24 season) 06/03/2023   Mammogram  02/21/2024   Medicare Annual Wellness Visit  09/02/2024   DTaP/Tdap/Td vaccine (3 - Td or Tdap) 01/30/2026   Pneumonia Vaccine  Completed   Zoster (Shingles) Vaccine  Completed   HPV Vaccine  Aged Out   DEXA scan (bone density measurement)  Discontinued    Advanced directives: (In Chart) A copy of your advanced directives are scanned into your chart should your provider ever need it.  Next Medicare Annual Wellness Visit scheduled for next year: Yes

## 2023-09-03 NOTE — Progress Notes (Signed)
Subjective:   Gloria Cummings is a 82 y.o. female who presents for Medicare Annual (Subsequent) preventive examination.  Visit Complete: Virtual I connected with  Gloria Cummings on 09/03/23 by a audio enabled telemedicine application and verified that I am speaking with the correct person using two identifiers.  Patient Location: Home  Provider Location: Office/Clinic  I discussed the limitations of evaluation and management by telemedicine. The patient expressed understanding and agreed to proceed.  Vital Signs: Because this visit was a virtual/telehealth visit, some criteria may be missing or patient reported. Any vitals not documented were not able to be obtained and vitals that have been documented are patient reported.   Cardiac Risk Factors include: advanced age (>4men, >16 women);hypertension     Objective:    Today's Vitals   09/03/23 0952  Weight: 119 lb (54 kg)   Body mass index is 19.8 kg/m.     09/03/2023   10:00 AM 08/29/2022    9:33 AM 09/06/2018   12:48 PM 04/17/2018   11:26 AM 02/19/2017   10:10 AM 02/08/2017    4:00 PM 02/06/2017    2:38 PM  Advanced Directives  Does Patient Have a Medical Advance Directive? Yes Yes Yes Yes Yes Yes Yes  Type of Estate agent of [place];Living will Healthcare Power of [place];Living will Healthcare Power of [place];Living will Healthcare Power of [place];Living will Healthcare Power of [place];Living will Healthcare Power of [place];Living will Healthcare Power of [place];Living will  Does patient want to make changes to medical advance directive? No - Patient declined No - Patient declined    No - Patient declined   Copy of Healthcare Power of Attorney in Chart? Yes - validated most recent copy scanned in chart (See row information) Yes - validated most recent copy scanned in chart (See row information) Yes - validated most recent copy scanned in chart (See row information) Yes Yes  Yes     Current Medications (verified) Outpatient Encounter Medications as of 09/03/2023  Medication Sig   aspirin EC 81 MG tablet Take 81 mg by mouth daily.   calcium carbonate (TUMS EX) 750 MG chewable tablet Chew 1 tablet by mouth daily. Dc 3 2000   Cholecalciferol (VITAMIN D) 2000 units tablet Take 2,000 Units by mouth daily.   fish oil-omega-3 fatty acids 1000 MG capsule Take 4 g by mouth daily.    Multiple Vitamins-Minerals (MULTIVITAMIN WITH MINERALS) tablet Take 1 tablet by mouth daily.   Red Yeast Rice Extract 600 MG CAPS Take 1 capsule by mouth daily.   telmisartan-hydrochlorothiazide (MICARDIS HCT) 80-12.5 MG tablet Take 1 tablet by mouth daily.   No facility-administered encounter medications on file as of 09/03/2023.    Allergies (verified) Lubricants, Betadine [povidone iodine], and Lisinopril   History: Past Medical History:  Diagnosis Date   Anemia    Arthritis    Cancer (HCC)    endo metrial   left breast   Cataract    Dyspnea    hx of   Family history of adverse reaction to anesthesia    Mother died following surgery stroke   Family history of breast cancer    Family history of colon cancer    Family history of prostate cancer    Family history of stomach cancer    Heart murmur    History of colon polyps    Hypertension    Occipital neuralgia    Osteoporosis    Pneumonia    Past Surgical History:  Procedure Laterality Date   BREAST LUMPECTOMY     left lumpectomy   COLON SURGERY     procedures   EYE SURGERY     implants on both eyes   FOOT SURGERY     FRACTURE SURGERY     ROBOTIC ASSISTED LAP VAGINAL HYSTERECTOMY N/A 02/08/2017   Procedure: XI ROBOTIC ASSISTED LAPAROSCOPIC TOTAL HYSTERECTOMY, BILATERAL SALPINGOOPHORECTOMY;  Surgeon: Adolphus Birchwood, MD;  Location: [facility];  Service: Gynecology;  Laterality: N/A;   SENTINEL NODE BIOPSY N/A 02/08/2017   Procedure: SENTINEL NODE BIOPSY;  Surgeon: Adolphus Birchwood, MD;  Location: [facility];  Service: Gynecology;   Laterality: N/A;   TUBAL LIGATION     WISDOM TOOTH EXTRACTION     Family History  Problem Relation Age of Onset   Stroke Mother    Heart disease Father    Stroke Father    Heart disease Brother    Heart disease Maternal Grandmother    Heart disease Maternal Grandfather    Cancer Paternal Grandfather        NOS   Breast cancer Paternal Aunt        dx over 46   Stomach cancer Paternal Uncle    Prostate cancer Paternal Uncle    Cancer Paternal Aunt        possible colon cancer   Breast cancer Cousin        paternal first cousin dx over 74   Social History   Socioeconomic History   Marital status: Divorced    Spouse name: Not on file   Number of children: Not on file   Years of education: Not on file   Highest education level: Not on file  Occupational History   Not on file  Tobacco Use   Smoking status: Never   Smokeless tobacco: Never  Vaping Use   Vaping status: Never Used  Substance and Sexual Activity   Alcohol use: No   Drug use: No   Sexual activity: Not Currently  Other Topics Concern   Not on file  Social History Narrative   Not on file   Social Determinants of Health   Financial Resource Strain: Low Risk  (09/03/2023)   Overall Financial Resource Strain (CARDIA)    Difficulty of Paying Living Expenses: Not hard at all  Food Insecurity: No Food Insecurity (09/03/2023)   Hunger Vital Sign    Worried About Running Out of Food in the Last Year: Never true    Ran Out of Food in the Last Year: Never true  Transportation Needs: No Transportation Needs (09/03/2023)   PRAPARE - Administrator, Civil Service (Medical): No    Lack of Transportation (Non-Medical): No  Physical Activity: Sufficiently Active (09/03/2023)   Exercise Vital Sign    Days of Exercise per Week: 1 day    Minutes of Exercise per Session: 150+ min  Stress: No Stress Concern Present (09/03/2023)   [employer] of Occupational Health - Occupational Stress Questionnaire     Feeling of Stress : Not at all  Social Connections: Socially Isolated (09/03/2023)   Social Connection and Isolation Panel [NHANES]    Frequency of Communication with Friends and Family: More than three times a week    Frequency of Social Gatherings with Friends and Family: More than three times a week    Attends Religious Services: Never    Database administrator or Organizations: No    Attends Banker Meetings: Never    Marital Status: Divorced  Tobacco Counseling Counseling given: Not Answered   Clinical Intake:  Pre-visit preparation completed: Yes  Pain : No/denies pain     BMI - recorded: 19.8 Nutritional Status: BMI of 19-24  Normal Nutritional Risks: None Diabetes: No  How often do you need to have someone help you when you read instructions, pamphlets, or other written materials from your doctor or pharmacy?: 1 - Never  Interpreter Needed?: No  Information entered by :: Lanier Ensign, LPN   Activities of Daily Living    09/03/2023    9:55 AM  In your present state of health, do you have any difficulty performing the following activities:  Hearing? 1  Comment HOH  Vision? 0  Difficulty concentrating or making decisions? 0  Walking or climbing stairs? 0  Dressing or bathing? 0  Doing errands, shopping? 0  Preparing Food and eating ? N  Using the Toilet? N  In the past six months, have you accidently leaked urine? N  Do you have problems with loss of bowel control? N  Managing your Medications? N  Managing your Finances? N  Housekeeping or managing your Housekeeping? N    Patient Care Team: Willow Ora, MD as PCP - General (Family Medicine) Elon Spanner Madelaine Etienne, MD as Consulting Physician (Obstetrics and Gynecology) Adolphus Birchwood, MD as Consulting Physician (Gynecologic Oncology) Jearld Shines, DC as Referring Physician (Chiropractic Medicine) Mateo Flow, MD as Consulting Physician (Ophthalmology) Irene Limbo, DDS as  Referring Physician (Dentistry) Venancio Poisson, MD as Consulting Physician (Dermatology)  Indicate any recent Medical Services you may have received from other than [facility] providers in the past year (date may be approximate).     Assessment:   This is a routine wellness examination for [facility].  Hearing/Vision screen Hearing Screening - Comments:: Pt stated HOH  Vision Screening - Comments:: Pt follows up with Dr Elmer Picker for annual eye exams    Goals Addressed             This Visit's Progress    Patient Stated       Maintain health and eating well        Depression Screen    09/03/2023    9:58 AM 08/03/2023   11:36 AM 04/30/2023   10:26 AM 12/14/2022   10:49 AM 11/01/2022    8:27 AM 08/29/2022    9:25 AM 08/16/2022   11:29 AM  PHQ 2/9 Scores  PHQ - 2 Score 0 0 0 0 0 2 0  PHQ- 9 Score      5     Fall Risk    09/03/2023   10:01 AM 08/03/2023   11:35 AM 04/30/2023   10:26 AM 12/14/2022   10:49 AM 11/01/2022    8:27 AM  Fall Risk   Falls in the past year? 0 0 0 0 0  Number falls in past yr: 0 0 0 0 0  Injury with Fall? 0 0 0 0 0  Risk for fall due to : No Fall Risks No Fall Risks No Fall Risks No Fall Risks No Fall Risks  Follow up Falls prevention discussed Falls evaluation completed Falls evaluation completed Falls evaluation completed Falls evaluation completed    MEDICARE RISK AT HOME: Medicare Risk at Home Any stairs in or around the home?: Yes If so, are there any without handrails?: No Home free of loose throw rugs in walkways, pet beds, electrical cords, etc?: Yes Adequate lighting in your home to reduce risk of falls?: Yes Life alert?: No  Use of a cane, walker or w/c?: No Grab bars in the bathroom?: No Shower chair or bench in shower?: No Elevated toilet seat or a handicapped toilet?: No  TIMED UP AND GO:  Was the test performed?  No    Cognitive Function:    04/17/2018   11:28 AM  MMSE - Mini Mental State Exam  Orientation to time 5  Orientation to  Place 5  Registration 3  Attention/ Calculation 5  Recall 3  Language- name 2 objects 2  Language- repeat 1  Language- follow 3 step command 3  Language- read & follow direction 1  Write a sentence 1  Copy design 1  Total score 30        09/03/2023   10:03 AM 08/29/2022    9:35 AM  6CIT Screen  What Year? 0 points 0 points  What month? 0 points 0 points  What time? 0 points 0 points  Count back from 20 0 points 0 points  Months in reverse 0 points 0 points  Repeat phrase 0 points 0 points  Total Score 0 points 0 points    Immunizations Immunization History  Administered Date(s) Administered   Influenza Split 09/03/2008, 07/25/2010   Influenza, High Dose Seasonal PF 09/19/2014, 09/19/2014, 08/16/2015, 08/16/2015, 08/17/2016   Influenza, Seasonal, Injecte, Preservative Fre 07/29/2014   Influenza,trivalent, recombinat, inj, PF 07/24/2011   Janssen (J&J) SARS-COV-2 Vaccination 01/20/2021   Moderna Covid-19 Vaccine Bivalent Booster 78yrs & up 11/28/2019   Pneumococcal Conjugate-13 07/29/2014, 09/19/2014   Pneumococcal Polysaccharide-23 07/29/2005   Tdap 10/25/2010, 01/31/2016   Zoster Recombinant(Shingrix) 03/20/2023, 04/17/2023   Zoster, Live 09/03/2008    TDAP status: Up to date  Flu Vaccine status: Due, Education has been provided regarding the importance of this vaccine. Advised may receive this vaccine at local pharmacy or Health Dept. Aware to provide a copy of the vaccination record if obtained from local pharmacy or Health Dept. Verbalized acceptance and understanding.  Pneumococcal vaccine status: Up to date  Covid-19 vaccine status: Information provided on how to obtain vaccines.   Qualifies for Shingles Vaccine? Yes   Zostavax completed Yes   Shingrix Completed?: Yes  Screening Tests Health Maintenance  Topic Date Due   INFLUENZA VACCINE  05/03/2023   COVID-19 Vaccine (3 - 2023-24 season) 06/03/2023   MAMMOGRAM  02/21/2024   Medicare Annual Wellness  (AWV)  09/02/2024   DTaP/Tdap/Td (3 - Td or Tdap) 01/30/2026   Pneumonia Vaccine 67+ Years old  Completed   Zoster Vaccines- Shingrix  Completed   HPV VACCINES  Aged Out   DEXA SCAN  Discontinued    Health Maintenance  Health Maintenance Due  Topic Date Due   INFLUENZA VACCINE  05/03/2023   COVID-19 Vaccine (3 - 2023-24 season) 06/03/2023    Colorectal cancer screening: No longer required.   Mammogram status: Completed 02/21/23. Repeat every year     Additional Screening:   Vision Screening: Recommended annual ophthalmology exams for early detection of glaucoma and other disorders of the eye. Is the patient up to date with their annual eye exam?  Yes  Who is the provider or what is the name of the office in which the patient attends annual eye exams? Dr Elmer Picker eye  If pt is not established with a provider, would they like to be referred to a provider to establish care? No .   Dental Screening: Recommended annual dental exams for proper oral hygiene   Community Resource Referral / Chronic Care Management: CRR required  this visit?  No   CCM required this visit?  No     Plan:     I have personally reviewed and noted the following in the patient's chart:   Medical and social history Use of alcohol, tobacco or illicit drugs  Current medications and supplements including opioid prescriptions. Patient is not currently taking opioid prescriptions. Functional ability and status Nutritional status Physical activity Advanced directives List of other physicians Hospitalizations, surgeries, and ER visits in previous 12 months Vitals Screenings to include cognitive, depression, and falls Referrals and appointments  In addition, I have reviewed and discussed with patient certain preventive protocols, quality metrics, and best practice recommendations. A written personalized care plan for preventive services as well as general preventive health recommendations were provided  to patient.     Marzella Schlein, LPN   [id number]   After Visit Summary: (Pick Up) Due to this being a telephonic visit, with patients personalized plan was offered to patient and patient has requested to Pick up at office.  Nurse Notes: Pt request flu vaccine at today's appt at 1:30 p.m

## 2023-09-03 NOTE — Patient Instructions (Addendum)
Please return in 6 months for your annual complete physical; please come fasting.    If you have any questions or concerns, please don't hesitate to send me a message via MyChart or call the office at [phone]. Thank you for visiting with [place] today! It's our pleasure caring for you.   VISIT SUMMARY:  During today's visit, we discussed your recent episode of dizziness and breathlessness, which you believe may have been due to anxiety. You mentioned that these symptoms have resolved and you are feeling back to your normal self. We also reviewed your blood pressure management and general health maintenance.  YOUR PLAN:  -POSITIONAL VERTIGO: Positional vertigo is a condition where you experience dizziness when changing positions, such as squatting or bending over. It may be worsened by anxiety. We discussed how to recognize the symptoms and manage anxiety. Please monitor for any recurrence of dizziness.  -AUTONOMIC DYSFUNCTION: Autonomic dysfunction affects the regulation of your blood pressure, causing fluctuations. We noted slightly elevated systolic pressure and low diastolic pressure. Continue taking your current blood pressure medication and monitor your blood pressure regularly. Adjust your medication as needed to maintain optimal levels.  -GENERAL HEALTH MAINTENANCE: You are generally well and active, participating in activities like dancing. You have received the shingles vaccine and are considering the flu shot. We agreed on the importance of getting the flu vaccination. Please continue your physical activities and attend regular check-ups.  INSTRUCTIONS:  Please schedule a follow-up appointment as needed. We will administer your flu shot during your next visit.

## 2023-09-03 NOTE — Progress Notes (Signed)
Subjective  CC:  Chief Complaint  Patient presents with   Hypertension   Dizziness    HPI: Gloria Cummings is a 82 y.o. female who presents to the office today to address the problems listed above in the chief complaint. Follow-up vertigo: Fortunately, patient's symptoms have completely resolved.  She did not require vestibular physical therapy.  She is feeling back to her baseline.  No longer with lightheadedness or dizziness.  She will occasionally become breathless with walking up hills but dances 4 hours on and without any problems.  No chest pain.  No neurologic deficits.  She feels good about this.  She does realize that anxiety because she did not know what was happening also played a role in her symptoms.  See last note Hypertension f/u: Control is fair . Pt reports she is doing well.  She feels well. Taking her medications.  Prefers not to adjust medications.She denies adverse effects from his BP medications. Compliance with medication is good.  She denies feeling lightheaded upon standing but tends to stand slowly.  She was orthostatic at last visit.  Blood pressures are labile.  She does not typically check at home.  Assessment  1. Essential hypertension   2. Chronic kidney disease, stage 3a (HCC)   3. Need for influenza vaccination   4. Labile diastolic hypertension   5. Benign paroxysmal positional vertigo due to bilateral vestibular disorder      Plan   Hypertension f/u: BP control is fairly well controlled.  Suspect autonomic dysfunction.  Will monitor blood pressure on current medications.  Reassess orthostatic changes at next visit.  Goal blood pressure 140s to 160s over 80s to 90s.  Diastolics were low so I do not want to adjust blood pressure medications at this time given history of orthostatic symptoms.  Patient agrees Positional vertigo has resolved.  Reassured. Discussed fall prevention Monitor kidney disease. Flu shot updated today  Education regarding  management of these chronic disease states was given. Management strategies discussed on successive visits include dietary and exercise recommendations, goals of achieving and maintaining IBW, and lifestyle modifications aiming for adequate sleep and minimizing stressors.   Follow up: 6 months for complete physical and recheck  Orders Placed This Encounter  Procedures   Flu Vaccine Trivalent High Dose (Fluad)   No orders of the defined types were placed in this encounter.     BP Readings from Last 3 Encounters:  09/03/23 (!) 156/56  08/03/23 (!) 158/60  04/30/23 (!) 150/74   Wt Readings from Last 3 Encounters:  09/03/23 122 lb 6.4 oz (55.5 kg)  09/03/23 119 lb (54 kg)  08/03/23 119 lb (54 kg)    Lab Results  Component Value Date   CHOL 230 (H) 04/30/2023   Lab Results  Component Value Date   HDL 80.80 04/30/2023   Lab Results  Component Value Date   LDLCALC 132 (H) 04/30/2023   Lab Results  Component Value Date   TRIG 86.0 04/30/2023   Lab Results  Component Value Date   CHOLHDL 3 04/30/2023   No results found for: "LDLDIRECT" Lab Results  Component Value Date   CREATININE 1.14 08/03/2023   BUN 27 (H) 08/03/2023   NA 138 08/03/2023   K 4.1 08/03/2023   CL 98 08/03/2023   CO2 32 08/03/2023    The ASCVD Risk score (Arnett DK, et al., 2019) failed to calculate for the following reasons:   The 2019 ASCVD risk score is only valid for  ages 42 to 34  I reviewed the patients updated PMH, FH, and SocHx.    Patient Active Problem List   Diagnosis Date Noted   Chronic kidney disease, stage 3a (HCC) 08/16/2022    Priority: High   History of breast cancer 03/12/2017    Priority: High   History of endometrial cancer 02/02/2017    Priority: High   Essential hypertension 01/20/2011    Priority: High   Family history of breast cancer     Priority: Medium    Family history of prostate cancer     Priority: Medium    Family history of colon cancer     Priority:  Medium    Colon polyp 01/14/2013    Priority: Medium    Recurrent UTI 01/14/2013    Priority: Medium    Osteoarthritis, multiple sites 10/25/2010    Priority: Medium    Osteoporosis 10/25/2010    Priority: Medium    Genetic testing 04/12/2017    Priority: Low   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 She has never used smokeless tobacco. She reports that she does not drink alcohol and does not use drugs.  Current Meds  Medication Sig   aspirin EC 81 MG tablet Take 81 mg by mouth daily.   calcium carbonate (TUMS EX) 750 MG chewable tablet Chew 1 tablet by mouth daily. Dc 3 2000   Cholecalciferol (VITAMIN D) 2000 units tablet Take 2,000 Units by mouth daily.   fish oil-omega-3 fatty acids 1000 MG capsule Take 4 g by mouth daily.    Multiple Vitamins-Minerals (MULTIVITAMIN WITH MINERALS) tablet Take 1 tablet by mouth daily.   Red Yeast Rice Extract 600 MG CAPS Take 1 capsule by mouth daily.   telmisartan-hydrochlorothiazide (MICARDIS HCT) 80-12.5 MG tablet Take 1 tablet by mouth daily.    Review of Systems: Cardiovascular: negative for chest pain, palpitations, leg swelling, orthopnea Respiratory: negative for SOB, wheezing or persistent cough Gastrointestinal: negative for abdominal pain Genitourinary: negative for dysuria or gross hematuria  Objective  Vitals: BP (!) 156/56   Pulse 79   Temp 98 F (36.7 C)   Ht 5\' 5"  (1.651 m)   Wt 122 lb 6.4 oz (55.5 kg)   SpO2 93%   BMI 20.37 kg/m  General: no acute distress  Psych:  Alert and oriented, normal mood and affect HEENT:  Normocephalic, atraumatic, supple neck  Cardiovascular:  RRR without murmur. no edema Respiratory:  Good breath sounds bilaterally, CTAB with normal respiratory effort Skin:  Warm, no rashes Neurologic:   Mental status is normal negative  Hallpike, no nystagmus.  No dizziness with head movement. Commons side effects, risks, benefits, and alternatives for medications and treatment plan prescribed today were discussed, and the patient expressed understanding of the given instructions. Patient is instructed to call or message via MyChart if he/she has any questions or concerns regarding our treatment plan. No barriers to understanding were identified. We discussed Red Flag symptoms and signs in detail. Patient expressed understanding regarding what to do in case of urgent or emergency type symptoms.  Medication list was reconciled, printed and provided to the patient in AVS. Patient instructions and summary information was reviewed with the patient as documented in the AVS. This note was prepared with assistance of Dragon voice recognition software. Occasional wrong-word or sound-a-like substitutions may have occurred due to  the inherent limitation

## 2023-09-04 ENCOUNTER — Telehealth: Payer: Self-pay | Admitting: Family Medicine

## 2023-09-04 NOTE — Telephone Encounter (Signed)
Patient called requesting a recommendation for a podiatrist that work with an orthopedic office from PCP. States request is based off of her toenail issues. States she will need name and number of facility or provider. Pt was seen yesterday,  09/03/23 by PCP. Please Advise.

## 2023-09-06 ENCOUNTER — Other Ambulatory Visit: Payer: Self-pay

## 2023-09-06 DIAGNOSIS — B351 Tinea unguium: Secondary | ICD-10-CM

## 2023-09-06 NOTE — Telephone Encounter (Signed)
LVM for pt to cb to office to let me know is she needing a referral for just toenail issues or is it other foot problems. Waiting on cb.

## 2023-09-06 NOTE — Telephone Encounter (Signed)
Patient returned Gloria Cummings's call.

## 2023-09-06 NOTE — Telephone Encounter (Signed)
Referral has been placed to podiatry.

## 2023-09-10 DIAGNOSIS — C44321 Squamous cell carcinoma of skin of nose: Secondary | ICD-10-CM | POA: Diagnosis not present

## 2023-09-12 ENCOUNTER — Other Ambulatory Visit: Payer: Self-pay | Admitting: Family Medicine

## 2023-09-17 ENCOUNTER — Ambulatory Visit: Payer: Medicare PPO | Admitting: Podiatry

## 2023-09-17 ENCOUNTER — Encounter: Payer: Self-pay | Admitting: Podiatry

## 2023-09-17 DIAGNOSIS — B351 Tinea unguium: Secondary | ICD-10-CM | POA: Diagnosis not present

## 2023-09-17 NOTE — Progress Notes (Signed)
  Subjective:  Patient ID: Gloria Cummings, female    DOB: July 01, 1941,   MRN: [id number]  Chief Complaint  Patient presents with   Nail Problem    Rfc.    82 y.o. female presents for concern of her toenails. Relates years ago she had a few nails removed and have grown back and been problems at times. Relates currently her left fourth toe and right fifth toe have been getting thicker and more difficult to trim . Denies any other pedal complaints. Denies n/v/f/c.   Past Medical History:  Diagnosis Date   Anemia    Arthritis    Cancer (HCC)    endo metrial   left breast   Cataract    Dyspnea    hx of   Family history of adverse reaction to anesthesia    Mother died following surgery stroke   Family history of breast cancer    Family history of colon cancer    Family history of prostate cancer    Family history of stomach cancer    Heart murmur    History of colon polyps    Hypertension    Occipital neuralgia    Osteoporosis    Pneumonia     Objective:  Physical Exam: Vascular: DP/PT pulses 2/4 bilateral. CFT <3 seconds. Normal hair growth on digits. No edema.  Skin. No lacerations or abrasions bilateral feet. Bilateral hallux and second right toenail with removal and spicule's of nails regrowth. Left fourth digit and right fifth digit nail are thickened and with subungual debris.  Musculoskeletal: MMT 5/5 bilateral lower extremities in DF, PF, Inversion and Eversion. Deceased ROM in DF of ankle joint.  Neurological: Sensation intact to light touch.   Assessment:   1. Onychomycosis      Plan:  Patient was evaluated and treated and all questions answered. -Examined patient -Discussed treatment options for painful dystrophic nails  -Clinical picture and Fungal culture was obtained by removing a portion of the hard nail itself from each of the involved toenails using a sterile nail nipper and sent to [facility] lab. Patient tolerated the biopsy procedure well without  discomfort or need for anesthesia.  -Discussed fungal nail treatment options including oral, topical, and laser treatments.  -Patient to return in 4 weeks for follow up evaluation and discussion of fungal culture results or sooner if symptoms worsen.   Louann Sjogren, DPM

## 2023-09-17 NOTE — Addendum Note (Signed)
Addended by: Daryel November on: 09/17/2023 04:21 PM   Modules accepted: Orders

## 2023-09-28 ENCOUNTER — Other Ambulatory Visit: Payer: Self-pay | Admitting: Podiatry

## 2023-10-15 ENCOUNTER — Telehealth: Payer: Self-pay | Admitting: Podiatry

## 2023-10-15 ENCOUNTER — Telehealth: Payer: Self-pay | Admitting: Family Medicine

## 2023-10-15 ENCOUNTER — Ambulatory Visit: Payer: Medicare PPO | Admitting: Podiatry

## 2023-10-15 NOTE — Telephone Encounter (Signed)
 Copied from CRM [id number]. Topic: Clinical - Medical Advice >> Oct 15, 2023 10:53 AM Elizebeth Brooking wrote: Reason for CRM: Patient is calling in regarding requesting an ENT doctor from [facility] [place] Patient is requesting a callback

## 2023-10-15 NOTE — Telephone Encounter (Signed)
 Pt would like to have a referral placed to ENT bc she has some tightness in her throat and some blood coming out her nose when she blows.

## 2023-10-15 NOTE — Telephone Encounter (Signed)
 Pt not able to make appt today.she is not sure if she needs to come in at all. Can someone check on the bako results and let her know if an appt is needed. She states she is feeling good and no issues with toes.

## 2023-10-15 NOTE — Telephone Encounter (Signed)
 Front office, please follow up with patient in regard.

## 2023-10-16 NOTE — Telephone Encounter (Signed)
 Called pt and she has an antifungal prescription medication already that is she taking so did not need an appt at the moment. She said she was just hoping it was not something else other than fungus.  She said that you were wonderful along with all the staff and thank you. She will call if needed

## 2023-10-18 ENCOUNTER — Telehealth: Payer: Self-pay

## 2023-10-18 NOTE — Telephone Encounter (Unsigned)
Copied from CRM [id number]. Topic: General - Other >> Oct 18, 2023  9:15 AM Irine Seal wrote: Reason for CRM: ( sorry I know this should be a telephone encounter supervisors say can't be done by e2c2) Pt declined the ENT referral appt, she stated that she has a MOHS procedure on her nose scheduled for 10/31/23.  She stated she appreciates all the help, but she has decided to go a different route, if she has trouble she will come see Dr. Mardelle Matte

## 2023-10-18 NOTE — Telephone Encounter (Signed)
Copied from CRM [id number]. Topic: General - Other >> Oct 18, 2023  9:15 AM Irine Seal wrote: Reason for CRM: ( sorry I know this should be a telephone encounter supervisors say can't be done by e2c2) Pt declined the ENT referral appt, she stated that she has a MOHS procedure on her nose scheduled for 10/31/23.  She stated she appreciates all the help, but she has decided to go a different route, if she has trouble she will come see Dr. Mardelle Matte  Dr. Mardelle Matte just an [place].  This message has been sent to Dr. Mardelle Matte.

## 2023-10-31 DIAGNOSIS — C44311 Basal cell carcinoma of skin of nose: Secondary | ICD-10-CM | POA: Diagnosis not present

## 2023-10-31 DIAGNOSIS — Z85828 Personal history of other malignant neoplasm of skin: Secondary | ICD-10-CM | POA: Diagnosis not present

## 2023-12-05 ENCOUNTER — Ambulatory Visit: Payer: Self-pay | Admitting: Family Medicine

## 2023-12-05 NOTE — Telephone Encounter (Signed)
 Pt's daughter called in stating pt is unable to come in for an appt but has taken a home test and was positive for Covid. Pt has previously had Covid twice and pt's daughter reports she responds very well to the Paxlovid. Pt's daughter requests provider review and send script if appropriate. Pharmacy confirmed. Please call Victorino Dike with any additional questions or updates. [phone].

## 2023-12-05 NOTE — Telephone Encounter (Signed)
  Chief Complaint: fever, cough Symptoms: congestion Frequency: constant   Disposition: [] ED /[] Urgent Care (no appt availability in office) / [x] Appointment(In office/virtual)/ []  [place] [facility]/ [] Home Care/ [] Refused Recommended Disposition /[] [place] [facility]/ []  Follow-up with PCP Additional Notes: Pt took Covid home tested positive today. Pt has fever, cough, chills, congestion, and sneezing. Pt is requesting Paxlovid be called in. Pt is not able to come in for appt. Pt is unable to check temp, but feels she has one. Pt needs to be seen within 24 hours, but pt refused. RN gave home care advice and pt verbalized understanding.              Copied from CRM [id number]. Topic: Clinical - Red Word Triage >> Dec 05, 2023  4:30 PM Denese Killings wrote: Red Word that prompted transfer to Nurse Triage: Patient has tested positive for COVID and wants to know if nirmatrelvir/ritonavir EUA (PAXLOVID) 20 x 150 MG & 10 x 100MG  TABS can be called in to [facility] if possible.  Symptoms-fatigue, sneezing, coughing, chest and nose congestion, chills, fever Reason for Disposition  Fever present > 3 days (72 hours)  Answer Assessment - Initial Assessment Questions 1. TEMPERATURE: "What is the most recent temperature?"  "How was it measured?"      na 2. ONSET: "When did the fever start?"      Last night  3. CHILLS: "Do you have chills?" If yes: "How bad are they?"  (e.g., none, mild, moderate, severe)   - NONE: no chills   - MILD: feeling cold   - MODERATE: feeling very cold, some shivering (feels better under a thick blanket)   - SEVERE: feeling extremely cold with shaking chills (general body shaking, rigors; even under a thick blanket)      Chill bumps  4. OTHER SYMPTOMS: "Do you have any other symptoms besides the fever?"  (e.g., abdomen pain, cough, diarrhea, earache, headache, sore throat, urination pain)     Coughing, congestion, sneezing, fatigue  5 6. CONTACTS: "Does anyone  else in the family have an infection?"     Daughter and grandson had Covid  7. TREATMENT: "What have you done so far to treat this fever?" (e.g., medications)     Nothing  8. IMMUNOCOMPROMISE: "Do you have of the following: diabetes, HIV positive, splenectomy, cancer chemotherapy, chronic steroid treatment, transplant patient, etc."     Denies  Protocols used: Fever-A-AH

## 2023-12-06 ENCOUNTER — Telehealth: Payer: Self-pay

## 2023-12-06 ENCOUNTER — Telehealth: Admitting: Urgent Care

## 2023-12-06 ENCOUNTER — Encounter: Payer: Self-pay | Admitting: Urgent Care

## 2023-12-06 ENCOUNTER — Encounter: Payer: Self-pay | Admitting: Family Medicine

## 2023-12-06 ENCOUNTER — Other Ambulatory Visit: Payer: Self-pay | Admitting: Family Medicine

## 2023-12-06 VITALS — Temp 102.6°F | Wt 122.0 lb

## 2023-12-06 DIAGNOSIS — U071 COVID-19: Secondary | ICD-10-CM | POA: Diagnosis not present

## 2023-12-06 MED ORDER — NIRMATRELVIR/RITONAVIR (PAXLOVID) TABLET (RENAL DOSING): 2.0000 | ORAL_TABLET | Freq: Two times a day (BID) | ORAL | 0 refills | Status: AC

## 2023-12-06 NOTE — Telephone Encounter (Signed)
 I completed the video visit. Pt was adamant that only her PCP call something in for her. I reached out to her PCP who is off the rest of the day. Would you mind calling patient back to see if she would like to reconsider the molnupiravir as her PCP will not be able to help today

## 2023-12-06 NOTE — Telephone Encounter (Addendum)
 Spoke with pt's daughter who states pt will not come in. Daughter was very upset. Explained to daughter I would advised Dr. Mardelle Matte of the situation and management. As previous msg states, pt needs to see a provider in order to get RX.

## 2023-12-06 NOTE — Progress Notes (Signed)
 Virtual Visit via Video Note  I connected with Gloria Cummings on 12/06/23 at  2:20 PM EST by a video enabled telemedicine application and verified that I am speaking with the correct person using two identifiers.  Patient Location: Home Provider Location: Office/Clinic  I discussed the limitations, risks, security, and privacy concerns of performing an evaluation and management service by video and the availability of in person appointments. I also discussed with the patient that there may be a patient responsible charge related to this service. The patient expressed understanding and agreed to proceed.   Subjective  PCP: Willow Ora, MD  Chief Complaint  Patient presents with   Covid Positive    Virtual- Pt tested positive for Covid yesterday and is having fatigue, congestion, chills and fever. Pt would like paxlovid.    HPI: Patient complains of head/chest congestion, runny nose, fever, fatigue, body aches, and chills . Tmax 101.6 yesterday She denies sore throat, shortness of breath, abdominal pain, and chest pain .  Onset of symptoms was 1 day ago, unchanged since that time.  She is drinking moderate amounts of fluids. Evaluation to date: none.  Treatment to date: none.  She has a history of HTN and CKD.   She does not smoke.   83yo female presents for video visit after having a home covid test. Performed the test due to fever of 101.6 yesterday, clear rhinorrhea, chills and congestion. She has had covid two other times, this is her third time. She reports severe fatigue, not wanting to get out of bed. She denies SOB, CP or dyspnea. No complications reported from prior Covid infections. She did have a few of the vaccines, but has not continued vaccination due to side effects.  ROS: Per HPI. All other pertinent systems are negative.   Current Outpatient Medications:    aspirin EC 81 MG tablet, Take 81 mg by mouth daily., Disp: , Rfl:    calcium carbonate (TUMS EX) 750  MG chewable tablet, Chew 1 tablet by mouth daily. Dc 3 2000, Disp: , Rfl:    Cholecalciferol (VITAMIN D) 2000 units tablet, Take 2,000 Units by mouth daily., Disp: , Rfl:    fish oil-omega-3 fatty acids 1000 MG capsule, Take 4 g by mouth daily. , Disp: , Rfl:    Multiple Vitamins-Minerals (MULTIVITAMIN WITH MINERALS) tablet, Take 1 tablet by mouth daily., Disp: , Rfl:    Red Yeast Rice Extract 600 MG CAPS, Take 1 capsule by mouth daily., Disp: , Rfl:    telmisartan-hydrochlorothiazide (MICARDIS HCT) 80-12.5 MG tablet, Take 1 tablet by mouth daily., Disp: 90 tablet, Rfl: 3    Objective  Vital signs not able to be obtained due to this being a virtual visit.  Physical Exam Constitutional:      Appearance: Normal appearance.     Comments: Limited exam performed due to video technology cutting out partway through the call  Pulmonary:     Effort: Pulmonary effort is normal. No respiratory distress.     Comments: Pt able to speak in complete sentences without breathlessness Neurological:     Mental Status: She is alert.      Assessment & Plan COVID-19  1. COVID-19   Discussed best plan of action would be to start Molnupiravir x 5 days today. 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 This test was 4 months ago, no recent labs. I am uncomfortable with paxlovid, despite patient having taken it in the past, due to these labs. Pt  refused Rx for molnupiravir and requested that I contact PCP. Pt is not wanting any Rx from me at this time, but is requesting to consult with her PCP prior to any Rx being called in. I did place a message to PCP for possible consideration of Rx based upon my video visit note today. I encouraged pt to start the molnupiravir as covid treatments work best when started ASAP. Pt stated understanding and again requested I do not call in anything for her.  No follow-ups on file.   I discussed the assessment and treatment plan with the patient. The patient was provided an  opportunity to ask questions, and all were answered. The patient agreed with the plan and demonstrated an understanding of the instructions.   The patient was advised to call back or seek an in-person evaluation if the symptoms worsen or if the condition fails to improve as anticipated.  The above assessment and management plan was discussed with the patient. The patient verbalized understanding of and has agreed to the management plan.   Maretta Bees, PA

## 2023-12-06 NOTE — Telephone Encounter (Signed)
 I completed the video visit with patient, however she is not wanting any Rx to come from me. She requested I reach out to Dr. Mardelle Matte. She would not accept my recommendations of molnupiravir and is requesting Dr. Mardelle Matte to review my note and consider Rx based upon it.

## 2023-12-06 NOTE — Telephone Encounter (Signed)
 Spoke w/pt and booked virtual appt with [facility] today @ 2:20pm

## 2023-12-06 NOTE — Telephone Encounter (Signed)
 Reason for CRM: Patient's daughter called on behalf pf patient, wanting to let Eloy End, PA know that they will be attending the virtual appointment. Patient thought the appointment time was moved up tp 2:05 and started freaking out because her daughter hadn't made it there yet to assist with the video.   FYI.

## 2023-12-06 NOTE — Telephone Encounter (Signed)
 Copied from CRM [id number]. Topic: General - Other >> Dec 06, 2023 10:06 AM Truddie Crumble wrote: Reason for CRM: patient daughter called checking on a covid medication prescription for the patient. The office stated the patient has to have an appointment to get medication and the message has been sent to the patient doctor. The patient daughter stated the patient does not want to expose other people wherever she may go for an appointment. The patient daughter stated  that she had covid previously and had afib and ablation so the patient does not want to ride with her because of harming her and the patient is stubborn. I told the patient that the office can not prescribe medication without an appointment attached. Patient daughter wanted to know if the patient can do the visit on her mychart  The front office is going to try an get her scheduled with a provider to do a VV.

## 2023-12-07 ENCOUNTER — Telehealth: Payer: Self-pay

## 2023-12-07 NOTE — Telephone Encounter (Signed)
 Copied from CRM [id number]. Topic: Clinical - Medication Question >> Dec 06, 2023  2:55 PM Jon Gills C wrote: Reason for CRM: Patient called in stated she would like to speak with Dr.Andy to see what it is that she should be prescribed she stated that she has had covid 3 times in the past and everytime she has been prescribed the medication  nirmatrelvir/ritonavir EUA (PAXLOVID) 20 x 150 MG & 10 x 100MG  TABS . But the physician that she has talked to today stated she shouldn't take this and take something else . She would like Dr.Andy advice on this on what she should take. She is also requesting a callback on this matter as she stated she is in day 3 of having covid  Please Advise  Message has been sent to provider to address

## 2024-01-16 ENCOUNTER — Encounter: Payer: Self-pay | Admitting: Family Medicine

## 2024-01-17 ENCOUNTER — Telehealth: Payer: Self-pay

## 2024-01-17 DIAGNOSIS — H40013 Open angle with borderline findings, low risk, bilateral: Secondary | ICD-10-CM | POA: Diagnosis not present

## 2024-01-17 DIAGNOSIS — H26491 Other secondary cataract, right eye: Secondary | ICD-10-CM | POA: Diagnosis not present

## 2024-01-17 DIAGNOSIS — H35033 Hypertensive retinopathy, bilateral: Secondary | ICD-10-CM | POA: Diagnosis not present

## 2024-01-17 NOTE — Telephone Encounter (Signed)
 Copied from CRM [id number]. Topic: Clinical - Request for Lab/Test Order >> Jan 17, 2024 11:28 AM Marlan Silva wrote: Reason for CRM: Patient wants to know if she should fast prior to her physical appointment on 03/19/2024. Patient is requesting a call back with this information.  Spoke with pt to let know know to fast 4hrs prior to her appt for her physical

## 2024-01-17 NOTE — Telephone Encounter (Signed)
 Copied from CRM [id number]. Topic: General - Call Back - No Documentation >> Jan 15, 2024  5:49 PM [place] J wrote: Reason for CRM: Patient is returning a call from [facility].  Pt stated that [facility] called to reschedule an appt for her bc Jonelle Neri will not be in the office on a certain day in Jun.  Please call pt back about rescheduling.

## 2024-02-27 DIAGNOSIS — Z1231 Encounter for screening mammogram for malignant neoplasm of breast: Secondary | ICD-10-CM | POA: Diagnosis not present

## 2024-02-27 LAB — HM MAMMOGRAPHY

## 2024-03-10 ENCOUNTER — Ambulatory Visit: Admitting: Family Medicine

## 2024-03-10 ENCOUNTER — Encounter: Payer: Medicare PPO | Admitting: Family Medicine

## 2024-03-10 ENCOUNTER — Other Ambulatory Visit (HOSPITAL_COMMUNITY)
Admission: RE | Admit: 2024-03-10 | Discharge: 2024-03-10 | Disposition: A | Discharge status: Home / Self Care | Source: Ambulatory Visit | Attending: Family Medicine | Admitting: Family Medicine

## 2024-03-10 ENCOUNTER — Encounter: Payer: Self-pay | Admitting: Family Medicine

## 2024-03-10 VITALS — BP 156/71 | HR 74 | Temp 98.1°F | Ht 65.0 in | Wt 120.2 lb

## 2024-03-10 DIAGNOSIS — N898 Other specified noninflammatory disorders of vagina: Secondary | ICD-10-CM

## 2024-03-10 DIAGNOSIS — I951 Orthostatic hypotension: Secondary | ICD-10-CM | POA: Diagnosis not present

## 2024-03-10 DIAGNOSIS — R4189 Other symptoms and signs involving cognitive functions and awareness: Secondary | ICD-10-CM | POA: Insufficient documentation

## 2024-03-10 DIAGNOSIS — N1831 Chronic kidney disease, stage 3a: Secondary | ICD-10-CM | POA: Diagnosis not present

## 2024-03-10 DIAGNOSIS — Z853 Personal history of malignant neoplasm of breast: Secondary | ICD-10-CM

## 2024-03-10 DIAGNOSIS — Z8542 Personal history of malignant neoplasm of other parts of uterus: Secondary | ICD-10-CM

## 2024-03-10 DIAGNOSIS — Z0001 Encounter for general adult medical examination with abnormal findings: Secondary | ICD-10-CM

## 2024-03-10 DIAGNOSIS — I10 Essential (primary) hypertension: Secondary | ICD-10-CM

## 2024-03-10 DIAGNOSIS — M810 Age-related osteoporosis without current pathological fracture: Secondary | ICD-10-CM

## 2024-03-10 DIAGNOSIS — H9193 Unspecified hearing loss, bilateral: Secondary | ICD-10-CM

## 2024-03-10 DIAGNOSIS — Z711 Person with feared health complaint in whom no diagnosis is made: Secondary | ICD-10-CM | POA: Insufficient documentation

## 2024-03-10 NOTE — Progress Notes (Signed)
 Subjective  Chief Complaint  Patient presents with   Annual Exam    Pt here for Annual Exam and is currently fasting    Hypertension    HPI: Gloria Cummings is a 83 y.o. female who presents to [facility] [place] today for a Female Wellness Visit. She also has the concerns and/or needs as listed above in the chief complaint. These will be addressed in addition to the Health Maintenance Visit.   Wellness Visit: annual visit with health maintenance review and exam  HM: pt defers dexa for osteoporosis screening. Discussed again. Mammo current. Still dancing regularly. Daugher here with her today; concerns about memory. Last CIT6 score from 09/2023 was 0. Nl MMSE 2019. Very HOH.  Chronic disease f/u and/or acute problem visit: (deemed necessary to be done in addition to the wellness visit): Discussed the use of AI scribe software for clinical note transcription with the patient, who gave verbal consent to proceed.  History of Present Illness Gloria Cummings is an 83 year old female who presents for an annual physical exam.  She experiences lightheadedness when bending or standing up, which she attributes to her blood pressure medication. She takes her blood pressure medication daily but does not monitor her blood pressure at home. She has a history of dizziness, which was severe enough at one point that she had to hold onto her head while lying down, but this has not recurred since the last episode.  She reports occasional knee pain, particularly when twisting her knee while taking stairs, and has noticed a decline in her hand coordination, leading to misjudging distances. She also experiences leg cramps in the morning and a sore neck.  She has been experiencing constipation, describing her bowel movements as either 'sheep shit' or 'logs' and reports feeling bloated. This is a new issue for her, and she has not tried any treatments yet.  She mentions a slight yellowish  vaginal discharge, which is not bloody or irritative. She has a history of a hysterectomy due to endometriosis. Additionally, she reports a rash under her breasts, which she attributes to moisture and possibly yeast.  She has noticed cognitive changes, including difficulty remembering names and words, and misplacing items. She attributes these changes to aging and has discussed them with peers who report similar experiences. She also reports hearing difficulties, particularly in noisy environments or when people speak softly.  During a recent illness, she experienced a sore throat and laryngitis, with difficulty swallowing. She did not have congestion, coughing, or sneezing, but experienced morning throat clearing.   Assessment  1. Encounter for well adult exam with abnormal findings   2. Labile essential hypertension   3. Chronic kidney disease, stage 3a (HCC)   4. Hypercalcemia   5. History of breast cancer   6. History of endometrial cancer   7. Age-related osteoporosis without current pathological fracture   8. Vaginal discharge   9. Concern about memory   10. Orthostatic hypotension   11. Bilateral hearing loss, unspecified hearing loss type      Plan  Female Wellness Visit: Age appropriate Health Maintenance and Prevention measures were discussed with patient. Included topics are cancer screening recommendations, ways to keep healthy (see AVS) including dietary and exercise recommendations, regular eye and dental care, use of seat belts, and avoidance of moderate alcohol use and tobacco use. No dexa per pt BMI: discussed patient's BMI and encouraged positive lifestyle modifications to help get to or maintain a target  BMI. HM needs and immunizations were addressed and ordered. See below for orders. See HM and immunization section for updates. Routine labs and screening tests ordered including cmp, cbc and lipids where appropriate. Discussed recommendations regarding Vit D and calcium  supplementation (see AVS)  Chronic disease management visit and/or acute problem visit: Assessment and Plan Assessment & Plan Constipation Recent onset possibly related to aging and decreased activity. - Recommend Miralax, one capful daily, mixed in a drink.  Vaginal Discharge Slight yellowish discharge, possibly related to vaginal atrophy. - r/o infection w/ vaginal swab testing. Could consider vaginal estrogen if bothersome. No pain or bleeding.  Hypertension Slightly elevated blood pressure with occasional orthostatic symptoms. Importance of monitoring discussed. -Orthostatinc hypotension in office today w/ correlates to symptoms. Will not treat bp agressively to avoid falls.  -stay hydrated -avoid getting up quickly - Encourage blood pressure checks at [facility] or similar locations at least four times a month and record readings.  Chronic Kidney Disease Stage 3, monitoring required. - Order blood work to monitor kidney function and other relevant parameters. Ca and pth  Hearing Loss Difficulty hearing, affecting cognitive function. Discussed affordable hearing aid options. - Recommend a hearing test at [facility] or [facility].  Cognitive Decline Memory issues noted, potential benefit of cognitive testing discussed. Last cog screen was normal. ? Anxiety or hearing loss contributing. - Schedule an annual wellness visit with a cognitive screen.  General Health Maintenance Due for routine health maintenance. - Order blood work as part of the annual exam. - Schedule an annual wellness visit with a cognitive screen.   Follow up: 3 mo for recheck  Orders Placed This Encounter  Procedures   CBC with Differential/Platelet   Comprehensive metabolic panel with GFR   Lipid panel   TSH   Parathyroid  hormone, intact (no Ca)   Extra Specimen   No orders of the defined types were placed in this encounter.     Body mass index is 20 kg/m. Wt Readings from Last 3 Encounters:  03/10/24  120 lb 3.2 oz (54.5 kg)  12/06/23 122 lb (55.3 kg)  09/03/23 122 lb 6.4 oz (55.5 kg)     Patient Active Problem List   Diagnosis Date Noted   Chronic kidney disease, stage 3a (HCC) 08/16/2022    Priority: High   History of breast cancer 03/12/2017    Priority: High    Overview:  2005 -Breast Cancer - lumpectomy, no radiation or chemo  Magranot is oncologist    History of endometrial cancer 02/02/2017    Priority: High   Labile essential hypertension 01/20/2011    Priority: High   Family history of breast cancer     Priority: Medium    Family history of prostate cancer     Priority: Medium    Family history of colon cancer     Priority: Medium    Colon polyp 01/14/2013    Priority: Medium     Overview:  q 5 year screens, Dr. Lavaughn [place]    Recurrent UTI 01/14/2013    Priority: Medium    Osteoarthritis, multiple sites 10/25/2010    Priority: Medium     Overview:  Back, hand - chiropractor, Dr. Jadene Maxwell    Osteoporosis 10/25/2010    Priority: Medium     Overview:  Bone Density 2012, HRT use in past x 15 years. Takes calcium and Vit D; daily weight bearing exercise Did not tolerate Actonel or boniva in past DEXA 2014 - T=-2.7; 12/2013 - Prolia discussed  but pt declines due to cost of 177 per 6 months.  07/2014 - had long discussion regarding risks/benefits of treatment options. Pt declines all meds at this time including evista, biphosphanates and prolia/reclast./cla    Genetic testing 04/12/2017    Priority: Low    Negative genetic testing on the CancerNext panel.  The CancerNext gene panel offered by [employer] includes sequencing and rearrangement analysis for the following 32 genes:   APC, ATM, BARD1, BMPR1A, BRCA1, BRCA2, BRIP1, CDH1, CDK4, CDKN2A, CHEK2, EPCAM, GREM1, MLH1, MRE11A, MSH2, MSH6, MUTYH, NBN, NF1, PALB2, PMS2, POLD1, POLE, PTEN, RAD50, RAD51D, SMAD4, SMARCA4, STK11, and TP53.  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 Taken with the germline results demonstrating absence of pathogenic mutations or likely pathogenic variants (VLPs) in the mismatch repair (MMR) genes, the likelihood that this individual has Lynch syndrome/HNPCC is greatly decreased; however, the possibility of an undetected variant of either germline or somatic origin due to mosaicism and other rare etiologies cannot be ruled out by the current methodology. Correlation with clinical history and external tumor testing results is advised.    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    Priority: Low   Bilateral hearing loss 03/10/2024   Orthostatic hypotension 03/10/2024   Concern about memory 03/10/2024   Health Maintenance  Topic Date Due   COVID-19 Vaccine (3 - 2024-25 season) 03/26/2024 (Originally 06/03/2023)   INFLUENZA VACCINE  05/02/2024   Medicare Annual Wellness (AWV)  09/02/2024   MAMMOGRAM  02/26/2025   DTaP/Tdap/Td (3 - Td or Tdap) 01/30/2026   Pneumococcal Vaccine: 50+ Years  Completed   Zoster Vaccines- Shingrix   Completed   HPV VACCINES  Aged Out   Meningococcal B Vaccine  Aged Out   DEXA SCAN  Discontinued   Immunization History  Administered Date(s) Administered   Fluad Trivalent(High Dose 65+) 09/03/2023   Influenza Split 09/03/2008, 07/25/2010   Influenza, High Dose Seasonal PF 09/19/2014, 09/19/2014, 08/16/2015, 08/16/2015, 08/17/2016   Influenza, Seasonal, Injecte, Preservative Fre 07/29/2014   Influenza,trivalent, recombinat, inj, PF 07/24/2011   Janssen (J&J) SARS-COV-2  Vaccination 01/20/2021   Moderna Covid-19 Vaccine Bivalent Booster 43yrs & up 11/28/2019   Pneumococcal Conjugate-13 07/29/2014, 09/19/2014   Pneumococcal Polysaccharide-23 07/29/2005   Tdap 10/25/2010, 01/31/2016   Zoster Recombinant(Shingrix ) 03/20/2023, 04/17/2023   Zoster, Live 09/03/2008   We updated and reviewed the patient's past history in detail and it is documented below. Allergies: Patient is allergic to lubricants, betadine [povidone iodine], and lisinopril. Past Medical History Patient  has a past medical history of Anemia, Arthritis, Cancer (HCC), Cataract, Dyspnea, Family history of adverse reaction to anesthesia, Family history of breast cancer, Family history of colon cancer, Family history of prostate cancer, Family history of stomach cancer, Heart murmur, History of colon polyps, Hypertension, Occipital neuralgia, Osteoporosis, and Pneumonia. Past Surgical History Patient  has a past surgical history that includes Foot surgery; Tubal ligation; Colon surgery; Eye surgery; Fracture surgery; Wisdom tooth extraction; Breast lumpectomy; Robotic assisted lap vaginal hysterectomy (N/A, 02/08/2017); and Sentinel node biopsy (N/A, 02/08/2017). Family History: Patient family history includes Breast cancer in  her cousin and paternal aunt; Cancer in her paternal aunt and paternal grandfather; Heart disease in her brother, father, maternal grandfather, and maternal grandmother; Prostate cancer in her paternal uncle; Stomach cancer in her paternal uncle; Stroke in her father and mother. Social History:  Patient  reports that she has never smoked. She has never used smokeless tobacco. She reports that she does not drink alcohol and does not use drugs.  Review of Systems: Constitutional: negative for fever or malaise Ophthalmic: negative for photophobia, double vision or loss of vision Cardiovascular: negative for chest pain, dyspnea on exertion, or new LE swelling Respiratory: negative for  SOB or persistent cough Gastrointestinal: negative for abdominal pain, change in bowel habits or melena Genitourinary: negative for dysuria or gross hematuria, no abnormal uterine bleeding or disharge Musculoskeletal: negative for new gait disturbance or muscular weakness Integumentary: negative for new or persistent rashes, no breast lumps Neurological: negative for TIA or stroke symptoms Psychiatric: negative for SI or delusions Allergic/Immunologic: negative for hives  Patient Care Team    Relationship Specialty Notifications Start End  Luevenia Saha, MD PCP - General Family Medicine  07/31/22   Concepcion Deck, MD Consulting Physician Obstetrics and Gynecology  04/17/18   Alphonso Aschoff, MD Consulting Physician Gynecologic Oncology  04/17/18   Neomia Banner, DC Referring Physician Chiropractic Medicine  04/17/18   Amedeo Jupiter, MD Consulting Physician Ophthalmology  04/17/18   Adelaide Holy, DDS Referring Physician Dentistry  04/17/18   Avis Boehringer, MD Consulting Physician Dermatology  04/17/18     Objective  Vitals: BP (!) 156/71   Pulse 74   Temp 98.1 F (36.7 C)   Ht 5' 5 (1.651 m)   Wt 120 lb 3.2 oz (54.5 kg)   SpO2 97%   BMI 20.00 kg/m  General:  Well developed, well nourished, no acute distress , HOH Psych:  Alert and orientedx3,anxious mood and affect HEENT:  Normocephalic, atraumatic, non-icteric sclera,  supple neck without adenopathy, mass or thyromegaly Cardiovascular:  Normal S1, S2, RRR without gallop, rub or murmur Respiratory:  Good breath sounds bilaterally, CTAB with normal respiratory effort Gastrointestinal: normal bowel sounds, soft, non-tender, no noted masses. No HSM MSK: extremities without edema, joints without erythema or swelling Neurologic:    Mental status is normal.  Gross motor and sensory exams are normal.  No tremor  Commons side effects, risks, benefits, and alternatives for medications and treatment plan prescribed today were  discussed, and the patient expressed understanding of the given instructions. Patient is instructed to call or message via MyChart if he/she has any questions or concerns regarding our treatment plan. No barriers to understanding were identified. We discussed Red Flag symptoms and signs in detail. Patient expressed understanding regarding what to do in case of urgent or emergency type symptoms.  Medication list was reconciled, printed and provided to the patient in AVS. Patient instructions and summary information was reviewed with the patient as documented in the AVS. This note was prepared with assistance of Dragon voice recognition software. Occasional wrong-word or sound-a-like substitutions may have occurred due to the inherent limitations of voice recognition software

## 2024-03-10 NOTE — Patient Instructions (Signed)
 Please return in 3 months For follow up on chronic medical conditions  Please schedule an Annual Wellness with our nurse educator.   I will release your lab results to you on your MyChart account with further instructions. You may see the results before I do, but when I review them I will send you a message with my report or have my assistant call you if things need to be discussed. Please reply to my message with any questions. Thank you!   If you have any questions or concerns, please don't hesitate to send me a message via MyChart or call the office at [phone]. Thank you for visiting with us  today! It's our pleasure caring for you.

## 2024-03-11 ENCOUNTER — Telehealth: Payer: Self-pay

## 2024-03-11 LAB — EXTRA SPECIMEN

## 2024-03-11 LAB — PARATHYROID HORMONE, INTACT (NO CA): PTH: 26 pg/mL (ref 16–77)

## 2024-03-11 NOTE — Telephone Encounter (Signed)
 Called pt's daughter and LVM in regards to scheduling f/u.

## 2024-03-11 NOTE — Telephone Encounter (Signed)
 I have LVM with daughter of lab results/recommendation

## 2024-03-11 NOTE — Telephone Encounter (Signed)
 Critical WBC 1.6

## 2024-03-12 LAB — CERVICOVAGINAL ANCILLARY ONLY
Bacterial Vaginitis (gardnerella): NEGATIVE
Candida Glabrata: NEGATIVE
Candida Vaginitis: NEGATIVE
Comment: NEGATIVE
Comment: NEGATIVE
Comment: NEGATIVE

## 2024-03-15 ENCOUNTER — Ambulatory Visit: Payer: Self-pay | Admitting: Family

## 2024-03-15 DIAGNOSIS — D72819 Decreased white blood cell count, unspecified: Secondary | ICD-10-CM

## 2024-03-19 ENCOUNTER — Encounter: Admitting: Family Medicine

## 2024-03-20 NOTE — Progress Notes (Signed)
 See mychart note Dear Ms. Jamie, We will need to repeat your WBC blood work and can discuss these lab results tomorrow. Sincerely, Dr. Jonelle Neri  Mildly elevated calcium 10.6, low wbc, nl pth, ldl 132nl tsh

## 2024-03-21 ENCOUNTER — Ambulatory Visit (INDEPENDENT_AMBULATORY_CARE_PROVIDER_SITE_OTHER): Admitting: Family Medicine

## 2024-03-21 ENCOUNTER — Encounter: Payer: Self-pay | Admitting: Family Medicine

## 2024-03-21 ENCOUNTER — Telehealth: Payer: Self-pay

## 2024-03-21 VITALS — BP 151/77 | HR 70 | Temp 97.9°F | Ht 65.0 in | Wt 121.0 lb

## 2024-03-21 DIAGNOSIS — D709 Neutropenia, unspecified: Secondary | ICD-10-CM | POA: Diagnosis not present

## 2024-03-21 LAB — CBC WITH DIFFERENTIAL/PLATELET
Basophils Absolute: 0 10*3/uL (ref 0.0–0.1)
Basophils Relative: 0.5 % (ref 0.0–3.0)
Eosinophils Absolute: 0.1 10*3/uL (ref 0.0–0.7)
Eosinophils Relative: 2.3 % (ref 0.0–5.0)
HCT: 37.4 % (ref 36.0–46.0)
Hemoglobin: 12.7 g/dL (ref 12.0–15.0)
Lymphocytes Relative: 26.9 % (ref 12.0–46.0)
Lymphs Abs: 1.1 10*3/uL (ref 0.7–4.0)
MCHC: 33.9 g/dL (ref 30.0–36.0)
MCV: 90.9 fl (ref 78.0–100.0)
Monocytes Absolute: 0.3 10*3/uL (ref 0.1–1.0)
Monocytes Relative: 6.5 % (ref 3.0–12.0)
Neutro Abs: 2.7 10*3/uL (ref 1.4–7.7)
Neutrophils Relative %: 63.8 % (ref 43.0–77.0)
Platelets: 231 10*3/uL (ref 150.0–400.0)
RBC: 4.12 Mil/uL (ref 3.87–5.11)
RDW: 12.9 % (ref 11.5–15.5)
WBC: 4.2 10*3/uL (ref 4.0–10.5)

## 2024-03-21 LAB — C-REACTIVE PROTEIN: CRP: <1 mg/dL (ref 0.5–20.0)

## 2024-03-21 LAB — VITAMIN B12: Vitamin B-12: 749 pg/mL (ref 211–911)

## 2024-03-21 LAB — SEDIMENTATION RATE: Sed Rate: 9 mm/h (ref 0–30)

## 2024-03-21 NOTE — Telephone Encounter (Signed)
 Copied from CRM [id number]. Topic: General - Other >> Mar 21, 2024  2:08 PM Allyne Areola wrote: Reason for CRM: Patient is calling to leave a message for Devra Fontana regarding a medication patient is taking that she did not remember during her appointment today. She is taking Magnesium acetate 400 mg.  Noted and has been added to med list

## 2024-03-21 NOTE — Progress Notes (Signed)
 Subjective  CC:  Chief Complaint  Patient presents with   Follow-up    Pt here to F/U with lab resulst    HPI: Gloria Cummings is a 83 y.o. female who presents to the office today to address the problems listed above in the chief complaint. Discussed the use of AI scribe software for clinical note transcription with the patient, who gave verbal consent to proceed.  History of Present Illness Gloria Cummings is an 83 year old female who presents with low white blood cell count.  She was informed that her white blood cell count was 1.6, which is significantly low.  Approximately three weeks ago, she experienced a sore throat that was unlike any other she had before, characterized by pain and difficulty swallowing, but without accompanying congestion or fatigue. She has had COVID-19 three times, with symptoms primarily of lethargy, but she is not currently experiencing those symptoms. She mentions having some morning congestion and coughing, but not related to a cold.  She has a history of hearing difficulties, which she finds isolating, but she prefers isolation. She lives alone and is planning a trip with her brother and sister-in-law for a [employer]. She enjoys dancing and has a ten-year-old daughter and a brother who is also hearing impaired.   Assessment  1. Neutropenia, unspecified type [facility])      Plan  Assessment and Plan Assessment & Plan Leukopenia- suspect related to recent viral pharyngitis. White blood cell count at 1.6, increasing infection risk. Possible viral cause due to recent sore throat. Hematology evaluation may be needed if persistent. - Repeat blood test to reassess white blood cell count. Check EBV and esr. - Refer to hematologist if leukopenia persists.  Hearing Loss Hearing difficulty impacts information processing and social interaction. Acknowledged isolation preference but addressing hearing loss is crucial for safety. - Evaluate for hearing  aids to improve hearing and reduce isolation.   Follow-up Scheduled for follow-up in September. Prefers phone or text communication due to hearing difficulties. - Schedule follow-up appointment in September. - Communicate blood test results and further instructions via phone or text.   Follow up: september Orders Placed This Encounter  Procedures   CBC with Differential/Platelet   Pathologist smear review   Vitamin B12   ANA   Sedimentation rate   C-reactive protein   Human parvovirus DNA detection by PCR   Epstein-Barr virus VCA antibody panel   No orders of the defined types were placed in this encounter.    I reviewed the patients updated PMH, FH, and SocHx.  Patient Active Problem List   Diagnosis Date Noted   Chronic kidney disease, stage 3a (HCC) 08/16/2022    Priority: High   History of breast cancer 03/12/2017    Priority: High   History of endometrial cancer 02/02/2017    Priority: High   Labile essential hypertension 01/20/2011    Priority: High   Family history of breast cancer     Priority: Medium    Family history of prostate cancer     Priority: Medium    Family history of colon cancer     Priority: Medium    Colon polyp 01/14/2013    Priority: Medium    Recurrent UTI 01/14/2013    Priority: Medium    Osteoarthritis, multiple sites 10/25/2010    Priority: Medium    Osteoporosis 10/25/2010    Priority: Medium    Genetic testing 04/12/2017    Priority: Low   Recurrent oral  herpes simplex infection 01/14/2013    Priority: Low   Bilateral hearing loss 03/10/2024   Orthostatic hypotension 03/10/2024   Concern about memory 03/10/2024   Current Meds  Medication Sig   aspirin  EC 81 MG tablet Take 81 mg by mouth daily.   calcium carbonate (TUMS EX) 750 MG chewable tablet Chew 1 tablet by mouth daily. Dc 3 2000   Cholecalciferol (VITAMIN D) 2000 units tablet Take 2,000 Units by mouth daily.   fish oil-omega-3 fatty acids 1000 MG capsule Take 4 g by  mouth daily.    Multiple Vitamins-Minerals (MULTIVITAMIN WITH MINERALS) tablet Take 1 tablet by mouth daily.   Red Yeast Rice Extract 600 MG CAPS Take 1 capsule by mouth daily.   telmisartan -hydrochlorothiazide  (MICARDIS  HCT) 80-12.5 MG tablet Take 1 tablet by mouth daily.   Allergies: Patient is allergic to lubricants, betadine [povidone iodine], and lisinopril. Family History: Patient family history includes Breast cancer in her cousin and paternal aunt; Cancer in her paternal aunt and paternal grandfather; Heart disease in her brother, father, maternal grandfather, and maternal grandmother; Prostate cancer in her paternal uncle; Stomach cancer in her paternal uncle; Stroke in her father and mother. Social History:  Patient  reports that she has never smoked. She has never used smokeless tobacco. She reports that she does not drink alcohol and does not use drugs.  Review of Systems: Constitutional: Negative for fever malaise or anorexia Cardiovascular: negative for chest pain Respiratory: negative for SOB or persistent cough Gastrointestinal: negative for abdominal pain  Objective  Vitals: BP (!) 151/77   Pulse 70   Temp 97.9 F (36.6 C)   Ht 5' 5 (1.651 m)   Wt 121 lb (54.9 kg)   SpO2 97%   BMI 20.14 kg/m  General: no acute distress , A&Ox3 HEENT: PEERL, conjunctiva normal, neck is supple, no LAD Cardiovascular:  RRR without murmur or gallop.  Respiratory:  Good breath sounds bilaterally, CTAB with normal respiratory effort Skin:  Warm, no rashes Commons side effects, risks, benefits, and alternatives for medications and treatment plan prescribed today were discussed, and the patient expressed understanding of the given instructions. Patient is instructed to call or message via MyChart if he/she has any questions or concerns regarding our treatment plan. No barriers to understanding were identified. We discussed Red Flag symptoms and signs in detail. Patient expressed  understanding regarding what to do in case of urgent or emergency type symptoms.  Medication list was reconciled, printed and provided to the patient in AVS. Patient instructions and summary information was reviewed with the patient as documented in the AVS. This note was prepared with assistance of Dragon voice recognition software. Occasional wrong-word or sound-a-like substitutions may have occurred due to the inherent limitations of voice recognition software

## 2024-03-22 ENCOUNTER — Ambulatory Visit: Payer: Self-pay | Admitting: Family Medicine

## 2024-03-22 NOTE — Progress Notes (Signed)
 Please cancel orders for ANA, pathologist review and hematology referral.  Please call pt to let her know that her WBC is now normal again and the low reading was due to her recent infection of the throat. Nothing further is needed at this time.

## 2024-03-24 LAB — EPSTEIN-BARR VIRUS VCA ANTIBODY PANEL
EBV NA IgG: >600 U/mL — ABNORMAL HIGH
EBV VCA IgG: >750 U/mL — ABNORMAL HIGH
EBV VCA IgM: <36 U/mL

## 2024-03-24 LAB — HUMAN PARVOVIRUS DNA DETECTION BY PCR: PARVOVIRUS B19 DNA,QL REAL TIME PCR: NOT DETECTED

## 2024-03-24 LAB — EXTRA SPECIMEN

## 2024-03-24 LAB — ANA: Anti Nuclear Antibody (ANA): NEGATIVE

## 2024-03-24 LAB — EXTRA LAV TOP TUBE

## 2024-03-25 NOTE — Progress Notes (Signed)
 Pt had an OV on 03/21/24 to address this

## 2024-03-26 NOTE — Progress Notes (Signed)
 Canceled.

## 2024-03-28 ENCOUNTER — Ambulatory Visit
Admission: RE | Admit: 2024-03-28 | Discharge: 2024-03-28 | Disposition: A | Discharge status: Home / Self Care | Source: Ambulatory Visit

## 2024-03-28 ENCOUNTER — Ambulatory Visit: Payer: Self-pay

## 2024-03-28 VITALS — BP 109/68 | HR 70 | Temp 98.1°F | Resp 18

## 2024-03-28 DIAGNOSIS — R0602 Shortness of breath: Secondary | ICD-10-CM | POA: Insufficient documentation

## 2024-03-28 DIAGNOSIS — N3001 Acute cystitis with hematuria: Secondary | ICD-10-CM | POA: Diagnosis not present

## 2024-03-28 DIAGNOSIS — D631 Anemia in chronic kidney disease: Secondary | ICD-10-CM | POA: Diagnosis present

## 2024-03-28 DIAGNOSIS — I6782 Cerebral ischemia: Secondary | ICD-10-CM | POA: Diagnosis not present

## 2024-03-28 DIAGNOSIS — Z823 Family history of stroke: Secondary | ICD-10-CM | POA: Diagnosis not present

## 2024-03-28 DIAGNOSIS — A6922 Other neurologic disorders in Lyme disease: Secondary | ICD-10-CM | POA: Diagnosis not present

## 2024-03-28 DIAGNOSIS — I672 Cerebral atherosclerosis: Secondary | ICD-10-CM | POA: Diagnosis not present

## 2024-03-28 DIAGNOSIS — A6920 Lyme disease, unspecified: Secondary | ICD-10-CM | POA: Diagnosis present

## 2024-03-28 DIAGNOSIS — R531 Weakness: Secondary | ICD-10-CM | POA: Diagnosis not present

## 2024-03-28 DIAGNOSIS — L304 Erythema intertrigo: Secondary | ICD-10-CM | POA: Diagnosis present

## 2024-03-28 DIAGNOSIS — R7401 Elevation of levels of liver transaminase levels: Secondary | ICD-10-CM | POA: Diagnosis present

## 2024-03-28 DIAGNOSIS — Z8249 Family history of ischemic heart disease and other diseases of the circulatory system: Secondary | ICD-10-CM | POA: Diagnosis not present

## 2024-03-28 DIAGNOSIS — Z8542 Personal history of malignant neoplasm of other parts of uterus: Secondary | ICD-10-CM | POA: Diagnosis not present

## 2024-03-28 DIAGNOSIS — D1809 Hemangioma of other sites: Secondary | ICD-10-CM | POA: Diagnosis not present

## 2024-03-28 DIAGNOSIS — Z91048 Other nonmedicinal substance allergy status: Secondary | ICD-10-CM | POA: Diagnosis not present

## 2024-03-28 DIAGNOSIS — E876 Hypokalemia: Secondary | ICD-10-CM | POA: Diagnosis present

## 2024-03-28 DIAGNOSIS — M810 Age-related osteoporosis without current pathological fracture: Secondary | ICD-10-CM | POA: Diagnosis present

## 2024-03-28 DIAGNOSIS — N179 Acute kidney failure, unspecified: Secondary | ICD-10-CM | POA: Diagnosis present

## 2024-03-28 DIAGNOSIS — Z853 Personal history of malignant neoplasm of breast: Secondary | ICD-10-CM | POA: Diagnosis not present

## 2024-03-28 DIAGNOSIS — N1832 Chronic kidney disease, stage 3b: Secondary | ICD-10-CM | POA: Diagnosis present

## 2024-03-28 DIAGNOSIS — R21 Rash and other nonspecific skin eruption: Secondary | ICD-10-CM | POA: Diagnosis not present

## 2024-03-28 DIAGNOSIS — R519 Headache, unspecified: Secondary | ICD-10-CM | POA: Diagnosis not present

## 2024-03-28 DIAGNOSIS — R202 Paresthesia of skin: Secondary | ICD-10-CM | POA: Diagnosis present

## 2024-03-28 DIAGNOSIS — I129 Hypertensive chronic kidney disease with stage 1 through stage 4 chronic kidney disease, or unspecified chronic kidney disease: Secondary | ICD-10-CM | POA: Diagnosis present

## 2024-03-28 DIAGNOSIS — M48061 Spinal stenosis, lumbar region without neurogenic claudication: Secondary | ICD-10-CM | POA: Diagnosis present

## 2024-03-28 DIAGNOSIS — R5383 Other fatigue: Secondary | ICD-10-CM | POA: Diagnosis not present

## 2024-03-28 DIAGNOSIS — M5126 Other intervertebral disc displacement, lumbar region: Secondary | ICD-10-CM | POA: Diagnosis not present

## 2024-03-28 DIAGNOSIS — G629 Polyneuropathy, unspecified: Secondary | ICD-10-CM | POA: Diagnosis present

## 2024-03-28 DIAGNOSIS — Z888 Allergy status to other drugs, medicaments and biological substances status: Secondary | ICD-10-CM | POA: Diagnosis not present

## 2024-03-28 DIAGNOSIS — K7689 Other specified diseases of liver: Secondary | ICD-10-CM | POA: Diagnosis not present

## 2024-03-28 DIAGNOSIS — Z79899 Other long term (current) drug therapy: Secondary | ICD-10-CM | POA: Diagnosis not present

## 2024-03-28 DIAGNOSIS — R7989 Other specified abnormal findings of blood chemistry: Secondary | ICD-10-CM | POA: Diagnosis not present

## 2024-03-28 DIAGNOSIS — M51369 Other intervertebral disc degeneration, lumbar region without mention of lumbar back pain or lower extremity pain: Secondary | ICD-10-CM | POA: Diagnosis not present

## 2024-03-28 DIAGNOSIS — Z7982 Long term (current) use of aspirin: Secondary | ICD-10-CM | POA: Diagnosis not present

## 2024-03-28 DIAGNOSIS — I10 Essential (primary) hypertension: Secondary | ICD-10-CM | POA: Diagnosis not present

## 2024-03-28 DIAGNOSIS — E871 Hypo-osmolality and hyponatremia: Secondary | ICD-10-CM | POA: Diagnosis present

## 2024-03-28 DIAGNOSIS — R200 Anesthesia of skin: Secondary | ICD-10-CM | POA: Diagnosis not present

## 2024-03-28 DIAGNOSIS — Z809 Family history of malignant neoplasm, unspecified: Secondary | ICD-10-CM | POA: Diagnosis not present

## 2024-03-28 DIAGNOSIS — Z803 Family history of malignant neoplasm of breast: Secondary | ICD-10-CM | POA: Diagnosis not present

## 2024-03-28 DIAGNOSIS — Z860100 Personal history of colon polyps, unspecified: Secondary | ICD-10-CM | POA: Diagnosis not present

## 2024-03-28 DIAGNOSIS — E8809 Other disorders of plasma-protein metabolism, not elsewhere classified: Secondary | ICD-10-CM | POA: Diagnosis present

## 2024-03-28 LAB — POCT URINALYSIS DIP (MANUAL ENTRY)
Bilirubin, UA: NEGATIVE
Glucose, UA: NEGATIVE mg/dL
Ketones, POC UA: NEGATIVE mg/dL
Nitrite, UA: NEGATIVE
Protein Ur, POC: 30 mg/dL — AB
Spec Grav, UA: 1.01 (ref 1.010–1.025)
Urobilinogen, UA: 0.2 U/dL
pH, UA: 5.5 (ref 5.0–8.0)

## 2024-03-28 LAB — POC SARS CORONAVIRUS 2 AG -  ED: SARS Coronavirus 2 Ag: NEGATIVE

## 2024-03-28 MED ORDER — CEPHALEXIN 250 MG PO CAPS: 250.0000 mg | ORAL_CAPSULE | Freq: Two times a day (BID) | ORAL | 0 refills | Status: DC

## 2024-03-28 NOTE — ED Triage Notes (Signed)
 Pt present with tiredness that started over the weekend. Pt daughter states she has ran a fever since Tuesday. States sweets taste horrible to her. Her daughter states she was told if she developed a fever to go to the ER. Pt has also been feeling SOB and intermittent lt ear pain.

## 2024-03-28 NOTE — Telephone Encounter (Signed)
 Called daughter - left message on machine to retunr our call.  Called pt - pt asked me to call her daughter for triage.      Reason for Triage: fever,chills,fatigue

## 2024-03-28 NOTE — Telephone Encounter (Addendum)
 Reason for Triage: fever,chills,fatigue   Patient already spoke with NT. Will go to Urgent Care for symptoms and f/u withprovider

## 2024-03-28 NOTE — ED Provider Notes (Signed)
 [facility]    CSN: [id number] Arrival date & time: 03/28/24  1429      History   Chief Complaint Chief Complaint  Patient presents with   Fatigue    Entered by patient    HPI Gloria Cummings is a 83 y.o. female presents with daughter for evaluation of fatigue.  Patient has a past medical history of hypertension, arthritis, anemia, breast, endometrial cancer who presents with daughter for fatigue.  She reports 5 to 6 days ago while driving back from [place] she began feeling very fatigued and chilled with subjective fevers.  States once he got home she slept for 16 hours and seemed to feel better until 3 days ago when she developed additional fatigue chills, decreased appetite and reporting that her normal foods do not taste good anymore.  Daughter states last night she had a fever of 100.4-100.8.  Does report some shortness of breath mainly with activity but states she has not been doing much for the past few days due to how she feels.  Also reports some left ear pain it has been intermittent.  Denies any chest pain, cough/congestion, sore throat, headaches, rashes, nausea/vomiting/diarrhea, dysuria.  Denies any asthma or smoking history.  Denies any history of CAD.  Patient did have a CBC done earlier this month that did show neutropenia with white blood cell count of 1.6.  It was repeated a week later and her white blood cell count was normal at 4.2.  Daughter is concerned this may be contributing to some of her symptoms.  She states she is able to stay hydrated.  No other concerns this time.  HPI  Past Medical History:  Diagnosis Date   Anemia    Arthritis    Cancer (HCC)    endo metrial   left breast   Cataract    Dyspnea    hx of   Family history of adverse reaction to anesthesia    Mother died following surgery stroke   Family history of breast cancer    Family history of colon cancer    Family history of prostate cancer    Family history of stomach cancer     Heart murmur    History of colon polyps    Hypertension    Occipital neuralgia    Osteoporosis    Pneumonia     Patient Active Problem List   Diagnosis Date Noted   Bilateral hearing loss 03/10/2024   Orthostatic hypotension 03/10/2024   Concern about memory 03/10/2024   Chronic kidney disease, stage 3a (HCC) 08/16/2022   Genetic testing 04/12/2017   History of breast cancer 03/12/2017   Family history of breast cancer    Family history of prostate cancer    Family history of colon cancer    History of endometrial cancer 02/02/2017   Colon polyp 01/14/2013   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   Recurrent UTI 01/14/2013   Labile essential hypertension 01/20/2011   Osteoarthritis, multiple sites 10/25/2010   Osteoporosis 10/25/2010    Past Surgical History:  Procedure Laterality Date   BREAST LUMPECTOMY     left lumpectomy   COLON SURGERY     procedures   EYE SURGERY     implants on both eyes   FOOT SURGERY     FRACTURE SURGERY     ROBOTIC ASSISTED LAP VAGINAL HYSTERECTOMY N/A 02/08/2017   Procedure: XI ROBOTIC ASSISTED LAPAROSCOPIC TOTAL HYSTERECTOMY, BILATERAL SALPINGOOPHORECTOMY;  Surgeon: Eloy Herring, MD;  Location: [facility];  Service: Gynecology;  Laterality: N/A;   SENTINEL NODE BIOPSY N/A 02/08/2017   Procedure: SENTINEL NODE BIOPSY;  Surgeon: Eloy Herring, MD;  Location: [facility];  Service: Gynecology;  Laterality: N/A;   TUBAL LIGATION     WISDOM TOOTH EXTRACTION      OB History   No obstetric history on file.      Home Medications    Prior to Admission medications   Medication Sig Start Date End Date Taking? Authorizing Provider  cephALEXin (KEFLEX) 250 MG capsule Take 1 capsule (250 mg total) by mouth 2 (two) times daily for 7 days. 03/28/24 04/04/24 Yes Kaylei Frink, Jodi R, NP  aspirin  EC 81 MG tablet Take 81 mg by mouth daily.    [provider]  calcium carbonate (TUMS EX) 750 MG chewable tablet Chew 1 tablet by mouth daily. Dc 3 2000     [provider]  Cholecalciferol (VITAMIN D) 2000 units tablet Take 2,000 Units by mouth daily.    [provider]  fish oil-omega-3 fatty acids 1000 MG capsule Take 4 g by mouth daily.     [provider]  magnesium aspartate (MAGINEX) 615 MG tablet Take 400 mg by mouth daily.    [provider]  Multiple Vitamins-Minerals (MULTIVITAMIN WITH MINERALS) tablet Take 1 tablet by mouth daily.    [provider]  Red Yeast Rice Extract 600 MG CAPS Take 1 capsule by mouth daily.    [provider]  telmisartan -hydrochlorothiazide  (MICARDIS  HCT) 80-12.5 MG tablet Take 1 tablet by mouth daily. 09/12/23   Jodie Lavern CROME, MD    Family History Family History  Problem Relation Age of Onset   Stroke Mother    Heart disease Father    Stroke Father    Heart disease Brother    Heart disease Maternal Grandmother    Heart disease Maternal Grandfather    Cancer Paternal Grandfather        NOS   Breast cancer Paternal Aunt        dx over 31   Stomach cancer Paternal Uncle    Prostate cancer Paternal Uncle    Cancer Paternal Aunt        possible colon cancer   Breast cancer Cousin        paternal first cousin dx over 42    Social History Social History   Tobacco Use   Smoking status: Never   Smokeless tobacco: Never  Vaping Use   Vaping status: Never Used  Substance Use Topics   Alcohol use: No   Drug use: No     Allergies   Lubricants, Betadine [povidone iodine], and Lisinopril   Review of Systems Review of Systems  Constitutional:  Positive for appetite change, chills, fatigue and fever.  Respiratory:  Positive for shortness of breath.      Physical Exam Triage Vital Signs ED Triage Vitals [03/28/24 1449]  Encounter Vitals Group     BP 109/68     Girls Systolic BP Percentile      Girls Diastolic BP Percentile      Boys Systolic BP Percentile      Boys Diastolic BP Percentile      Pulse Rate 70     Resp 18     Temp  98.1 F (36.7 C)     Temp Source Oral     SpO2 96 %     Weight      Height      Head Circumference  Peak Flow      Pain Score      Pain Loc      Pain Education      Exclude from Growth Chart    No data found.  Updated Vital Signs BP 109/68 (BP Location: Right Arm)   Pulse 70   Temp 98.1 F (36.7 C) (Oral)   Resp 18   SpO2 96%   Visual Acuity Right Eye Distance:   Left Eye Distance:   Bilateral Distance:    Right Eye Near:   Left Eye Near:    Bilateral Near:     Physical Exam Vitals and nursing note reviewed.  Constitutional:      General: She is not in acute distress.    Appearance: Normal appearance. She is not ill-appearing.  HENT:     Head: Normocephalic and atraumatic.     Right Ear: Tympanic membrane and ear canal normal.     Left Ear: Tympanic membrane and ear canal normal.     Nose: No congestion.     Mouth/Throat:     Mouth: Mucous membranes are moist.   Eyes:     Pupils: Pupils are equal, round, and reactive to light.    Cardiovascular:     Rate and Rhythm: Normal rate and regular rhythm.     Heart sounds: Normal heart sounds.  Pulmonary:     Effort: Pulmonary effort is normal.     Breath sounds: Normal breath sounds.   Skin:    General: Skin is warm and dry.   Neurological:     General: No focal deficit present.     Mental Status: She is alert and oriented to person, place, and time.   Psychiatric:        Mood and Affect: Mood normal.        Behavior: Behavior normal.      UC Treatments / Results  Labs (all labs ordered are listed, but only abnormal results are displayed) Labs Reviewed  POCT URINALYSIS DIP (MANUAL ENTRY) - Abnormal; Notable for the following components:      Result Value   Blood, UA trace-lysed (*)    Protein Ur, POC =30 (*)    Leukocytes, UA Small (1+) (*)    All other components within normal limits  URINE CULTURE  POC SARS CORONAVIRUS 2 AG -  ED   Comprehensive metabolic panel with GFR Order:  [id number]  Status: Final result     Next appt: 06/11/2024 at 10:00 AM in Family Medicine (Lavern LITTIE Heck, MD)     Dx: Encounter for well adult exam with ab...   Test Result Released: Yes (seen)     Messages: Seen   2 Result Notes     1 Patient Communication     View Follow-Up Encounter          Component Ref Range & Units (hover) 2 wk ago (03/10/24) 7 mo ago (08/03/23) 11 mo ago (04/30/23) 1 yr ago (08/16/22) 1 yr ago (08/02/22) 7 yr ago (02/09/17) 7 yr ago (02/06/17)  Sodium 138 138 138  138 135 R 141 R  Potassium 3.7 4.1 3.8  4.6 3.8 R 4.1 R  Chloride 98 98 98  100 101 R 104 R  CO2 30 32 31  31 26  R 30 R  Glucose, Bld 92 91 90  94 169 High  R 111 High  R  BUN 25 High  27 High  22  27 High  19 R 24 High  R  Creatinine, Ser 1.26 High  1.14 1.15  1.29 High  0.90 R 1.27 High  R  Total Bilirubin 0.6 0.5 0.6  0.7  0.4 R  Alkaline Phosphatase 62 62 57  51  63 R  AST 31 28 24  25  30  R  ALT 19 20 17  19  22  R  Total Protein 7.7 7.3 6.9  7.2  7.0 R  Albumin 4.9 4.6 4.5  4.6  4.2 R  GFR 39.21 Low  44.40 Low  CM 44.02 Low  CM  38.55 Low  CM    Comment: Calculated using the CKD-EPI Creatinine Equation (2021)  Calcium 10.6 High  10.7 High  10.8 High  10.6 High  R 11.1 High  8.7 Low  R 9.6 R  Resulting Agency [place] [facility] [place] [facility] [place] [facility] [place] [facility]        Specimen Collected: 03/10/24 15:28 Last Resulted: 03/11/24 11:19   EKG   Radiology No results found.  Procedures ED EKG  Date/Time: 03/28/2024 3:34 PM  Performed by: Loreda Myla SAUNDERS, NP Authorized by: Loreda Myla SAUNDERS, NP   ECG interpreted by ED Physician in the absence of a cardiologist: no   Previous ECG:    Previous ECG:  Compared to current   Similarity:  No change Rate:    ECG rate:  64   ECG rate assessment: normal   Rhythm:    Rhythm: sinus rhythm and A-V block     A-V block: 1st Degree   Ectopy:    Ectopy: none   QRS:    QRS axis:   Normal ST segments:    ST segments:  Non-specific T waves:    T waves: non-specific    (including critical care time)  Medications Ordered in UC Medications - No data to display  Initial Impression / Assessment and Plan / UC Course  I have reviewed the triage vital signs and the nursing notes.  Pertinent labs & imaging results that were available during my care of the patient were reviewed by me and considered in my medical decision making (see chart for details).     I reviewed exam and symptoms with daughter and patient.  She is well-appearing and in no acute distress.  Negative COVID testing.  EKG without any changes compared to previous EKG.  Urine shows small leuks and blood.  Will send urine culture and treat for UTI.  Labs reviewed.  Start Keflex at reduced dose given creatinine clearance of 29.  Advised PCP follow-up 2 to 3 days for recheck.  Strict ER precautions reviewed and daughter and patient verbalized understanding. Final Clinical Impressions(s) / UC Diagnoses   Final diagnoses:  Shortness of breath  Acute cystitis with hematuria     Discharge Instructions      The clinic will contact you with results of the urine culture done today positive.  Start Keflex twice daily for 7 days.  Lots of rest and fluids.  Please follow-up with your PCP in 2 to 3 days for recheck.  Please go to the ER if you develop any worsening symptoms.  Hope you feel better soon!     ED Prescriptions     Medication Sig Dispense Auth. Provider   cephALEXin (KEFLEX) 250 MG capsule Take 1 capsule (250 mg total) by mouth 2 (two) times daily for 7 days. 14 capsule Broedy Osbourne, Jodi R, NP      PDMP not reviewed this encounter.  Loreda Myla SAUNDERS, NP 03/28/24 1626

## 2024-03-28 NOTE — Telephone Encounter (Signed)
 Pt's daughter states they already have an appointment at the closest Urgent Care today at 2:30 pm.

## 2024-03-28 NOTE — Discharge Instructions (Signed)
 The clinic will contact you with results of the urine culture done today positive.  Start Keflex twice daily for 7 days.  Lots of rest and fluids.  Please follow-up with your PCP in 2 to 3 days for recheck.  Please go to the ER if you develop any worsening symptoms.  Hope you feel better soon!

## 2024-03-28 NOTE — Telephone Encounter (Signed)
 FYI Only or Action Required?: FYI only for provider.  Patient was last seen in primary care on 03/21/2024 by Gloria Lavern CROME, MD. Called Nurse Triage reporting Fatigue. Symptoms began several days ago. Interventions attempted: Rest, hydration, or home remedies. Symptoms are: gradually worsening.  Triage Disposition: See HCP Within 4 Hours (Or PCP Triage)  Patient/caregiver understands and will follow disposition?: Yes     Copied from CRM [id number]. Topic: Clinical - Red Word Triage >> Mar 28, 2024 10:12 AM Gloria Cummings wrote: Red Word that prompted transfer to Nurse Triage: Gloria Cummings returning NT call. Reason for Disposition  [1] MODERATE weakness (i.e., interferes with work, school, normal activities) AND [2] cause unknown  (Exceptions: Weakness from acute minor illness or poor fluid intake; weakness is chronic and not worse.)  Answer Assessment - Initial Assessment Questions 1. DESCRIPTION: Describe how you are feeling.     Feeling quite unwell Fever since Wednesday Recently seen and was advised that pt had low WBC - repeat labs came back low end of normal and was given neutropenia precautions by PCP - daughter unsure if current sx r/t neutropenia 2. SEVERITY: How bad is it?  Can you stand and walk?   - MILD (0-3): Feels weak or tired, but does not interfere with work, school or normal activities.   - MODERATE (4-7): Able to stand and walk; weakness interferes with work, school, or normal activities.   - SEVERE (8-10): Unable to stand or walk; unable to do usual activities.     Mild per daughter 3. ONSET: When did these symptoms begin? (e.g., hours, days, weeks, months)     Wednesday - fever (100.4) 4. CAUSE: What do you think is causing the weakness or fatigue? (e.g., not drinking enough fluids, medical problem, trouble sleeping)     Unknown, possibly low WBC  5. NEW MEDICINES:  Have you started on any new medicines recently? (e.g., opioid pain medicines,  benzodiazepines, muscle relaxants, antidepressants, antihistamines, neuroleptics, beta blockers)     N/a 6. OTHER SYMPTOMS: Do you have any other symptoms? (e.g., chest pain, fever, cough, SOB, vomiting, diarrhea, bleeding, other areas of pain)     Poor appetite - food tastes horrible, particularly sweets Triager did advise home COVID test if available 7. PREGNANCY: Is there any chance you are pregnant? When was your last menstrual period?     N/a     Triager attempted to schedule with PCP, but no access. Triager scheduled with [facility] as alternative. Triager will forward encounter for Dr Gloria 's office to review and advise office appt available. Caregiver verbalized understanding and is expecting call back from office for additional guidance from PCP, if any.  Protocols used: Weakness (Generalized) and Fatigue-A-AH

## 2024-03-30 ENCOUNTER — Emergency Department (HOSPITAL_BASED_OUTPATIENT_CLINIC_OR_DEPARTMENT_OTHER)

## 2024-03-30 ENCOUNTER — Other Ambulatory Visit (HOSPITAL_COMMUNITY)

## 2024-03-30 ENCOUNTER — Inpatient Hospital Stay (HOSPITAL_BASED_OUTPATIENT_CLINIC_OR_DEPARTMENT_OTHER)
Admission: EM | Admit: 2024-03-30 | Discharge: 2024-04-01 | DRG: 868 | Disposition: A | Discharge status: Home / Self Care | Attending: Internal Medicine | Admitting: Internal Medicine

## 2024-03-30 ENCOUNTER — Other Ambulatory Visit: Payer: Self-pay

## 2024-03-30 ENCOUNTER — Encounter (HOSPITAL_BASED_OUTPATIENT_CLINIC_OR_DEPARTMENT_OTHER): Payer: Self-pay | Admitting: Emergency Medicine

## 2024-03-30 ENCOUNTER — Emergency Department (HOSPITAL_BASED_OUTPATIENT_CLINIC_OR_DEPARTMENT_OTHER): Admitting: Radiology

## 2024-03-30 DIAGNOSIS — Z853 Personal history of malignant neoplasm of breast: Secondary | ICD-10-CM

## 2024-03-30 DIAGNOSIS — Z860100 Personal history of colon polyps, unspecified: Secondary | ICD-10-CM | POA: Diagnosis not present

## 2024-03-30 DIAGNOSIS — Z800 Family history of malignant neoplasm of digestive organs: Secondary | ICD-10-CM

## 2024-03-30 DIAGNOSIS — R21 Rash and other nonspecific skin eruption: Secondary | ICD-10-CM | POA: Diagnosis not present

## 2024-03-30 DIAGNOSIS — Z79899 Other long term (current) drug therapy: Secondary | ICD-10-CM | POA: Diagnosis not present

## 2024-03-30 DIAGNOSIS — D631 Anemia in chronic kidney disease: Secondary | ICD-10-CM | POA: Diagnosis present

## 2024-03-30 DIAGNOSIS — R200 Anesthesia of skin: Secondary | ICD-10-CM | POA: Diagnosis not present

## 2024-03-30 DIAGNOSIS — Z8249 Family history of ischemic heart disease and other diseases of the circulatory system: Secondary | ICD-10-CM

## 2024-03-30 DIAGNOSIS — G629 Polyneuropathy, unspecified: Secondary | ICD-10-CM | POA: Diagnosis present

## 2024-03-30 DIAGNOSIS — K7689 Other specified diseases of liver: Secondary | ICD-10-CM | POA: Diagnosis not present

## 2024-03-30 DIAGNOSIS — E876 Hypokalemia: Secondary | ICD-10-CM | POA: Diagnosis present

## 2024-03-30 DIAGNOSIS — N1832 Chronic kidney disease, stage 3b: Secondary | ICD-10-CM | POA: Diagnosis present

## 2024-03-30 DIAGNOSIS — M48061 Spinal stenosis, lumbar region without neurogenic claudication: Secondary | ICD-10-CM | POA: Diagnosis present

## 2024-03-30 DIAGNOSIS — Z91048 Other nonmedicinal substance allergy status: Secondary | ICD-10-CM

## 2024-03-30 DIAGNOSIS — L304 Erythema intertrigo: Secondary | ICD-10-CM | POA: Diagnosis present

## 2024-03-30 DIAGNOSIS — E8809 Other disorders of plasma-protein metabolism, not elsewhere classified: Secondary | ICD-10-CM | POA: Diagnosis present

## 2024-03-30 DIAGNOSIS — A6920 Lyme disease, unspecified: Principal | ICD-10-CM | POA: Diagnosis present

## 2024-03-30 DIAGNOSIS — Z7982 Long term (current) use of aspirin: Secondary | ICD-10-CM

## 2024-03-30 DIAGNOSIS — M810 Age-related osteoporosis without current pathological fracture: Secondary | ICD-10-CM | POA: Diagnosis present

## 2024-03-30 DIAGNOSIS — Z809 Family history of malignant neoplasm, unspecified: Secondary | ICD-10-CM

## 2024-03-30 DIAGNOSIS — Z803 Family history of malignant neoplasm of breast: Secondary | ICD-10-CM

## 2024-03-30 DIAGNOSIS — I672 Cerebral atherosclerosis: Secondary | ICD-10-CM | POA: Diagnosis not present

## 2024-03-30 DIAGNOSIS — R7401 Elevation of levels of liver transaminase levels: Secondary | ICD-10-CM | POA: Diagnosis present

## 2024-03-30 DIAGNOSIS — I6782 Cerebral ischemia: Secondary | ICD-10-CM | POA: Diagnosis not present

## 2024-03-30 DIAGNOSIS — I10 Essential (primary) hypertension: Secondary | ICD-10-CM | POA: Diagnosis present

## 2024-03-30 DIAGNOSIS — D1809 Hemangioma of other sites: Secondary | ICD-10-CM | POA: Diagnosis not present

## 2024-03-30 DIAGNOSIS — E871 Hypo-osmolality and hyponatremia: Secondary | ICD-10-CM | POA: Diagnosis present

## 2024-03-30 DIAGNOSIS — N179 Acute kidney failure, unspecified: Secondary | ICD-10-CM | POA: Diagnosis present

## 2024-03-30 DIAGNOSIS — R5383 Other fatigue: Principal | ICD-10-CM

## 2024-03-30 DIAGNOSIS — I129 Hypertensive chronic kidney disease with stage 1 through stage 4 chronic kidney disease, or unspecified chronic kidney disease: Secondary | ICD-10-CM | POA: Diagnosis present

## 2024-03-30 DIAGNOSIS — A6922 Other neurologic disorders in Lyme disease: Secondary | ICD-10-CM | POA: Diagnosis not present

## 2024-03-30 DIAGNOSIS — M5126 Other intervertebral disc displacement, lumbar region: Secondary | ICD-10-CM | POA: Diagnosis not present

## 2024-03-30 DIAGNOSIS — Z8542 Personal history of malignant neoplasm of other parts of uterus: Secondary | ICD-10-CM

## 2024-03-30 DIAGNOSIS — Z888 Allergy status to other drugs, medicaments and biological substances status: Secondary | ICD-10-CM | POA: Diagnosis not present

## 2024-03-30 DIAGNOSIS — R531 Weakness: Secondary | ICD-10-CM | POA: Diagnosis not present

## 2024-03-30 DIAGNOSIS — Z823 Family history of stroke: Secondary | ICD-10-CM | POA: Diagnosis not present

## 2024-03-30 DIAGNOSIS — M51369 Other intervertebral disc degeneration, lumbar region without mention of lumbar back pain or lower extremity pain: Secondary | ICD-10-CM | POA: Diagnosis not present

## 2024-03-30 DIAGNOSIS — R519 Headache, unspecified: Secondary | ICD-10-CM | POA: Diagnosis not present

## 2024-03-30 DIAGNOSIS — R202 Paresthesia of skin: Secondary | ICD-10-CM | POA: Diagnosis present

## 2024-03-30 DIAGNOSIS — R7989 Other specified abnormal findings of blood chemistry: Secondary | ICD-10-CM | POA: Diagnosis not present

## 2024-03-30 LAB — CBC WITH DIFFERENTIAL/PLATELET
Abs Immature Granulocytes: 0.02 10*3/uL (ref 0.00–0.07)
Basophils Absolute: 0 10*3/uL (ref 0.0–0.1)
Basophils Relative: 0 %
Eosinophils Absolute: 0.1 10*3/uL (ref 0.0–0.5)
Eosinophils Relative: 1 %
HCT: 34.8 % — ABNORMAL LOW (ref 36.0–46.0)
Hemoglobin: 12.4 g/dL (ref 12.0–15.0)
Immature Granulocytes: 0 %
Lymphocytes Relative: 17 %
Lymphs Abs: 1.3 10*3/uL (ref 0.7–4.0)
MCH: 31.1 pg (ref 26.0–34.0)
MCHC: 35.6 g/dL (ref 30.0–36.0)
MCV: 87.2 fL (ref 80.0–100.0)
Monocytes Absolute: 0.4 10*3/uL (ref 0.1–1.0)
Monocytes Relative: 6 %
Neutro Abs: 5.8 10*3/uL (ref 1.7–7.7)
Neutrophils Relative %: 76 %
Platelets: 189 10*3/uL (ref 150–400)
RBC: 3.99 MIL/uL (ref 3.87–5.11)
RDW: 12.2 % (ref 11.5–15.5)
WBC: 7.6 10*3/uL (ref 4.0–10.5)
nRBC: 0 % (ref 0.0–0.2)

## 2024-03-30 LAB — URINE CULTURE: Culture: NO GROWTH

## 2024-03-30 LAB — COMPREHENSIVE METABOLIC PANEL WITH GFR
ALT: 117 U/L — ABNORMAL HIGH (ref 0–44)
AST: 94 U/L — ABNORMAL HIGH (ref 15–41)
Albumin: 4.2 g/dL (ref 3.5–5.0)
Alkaline Phosphatase: 190 U/L — ABNORMAL HIGH (ref 38–126)
Anion gap: 13 (ref 5–15)
BUN: 38 mg/dL — ABNORMAL HIGH (ref 8–23)
CO2: 28 mmol/L (ref 22–32)
Calcium: 10.6 mg/dL — ABNORMAL HIGH (ref 8.9–10.3)
Chloride: 90 mmol/L — ABNORMAL LOW (ref 98–111)
Creatinine, Ser: 1.76 mg/dL — ABNORMAL HIGH (ref 0.44–1.00)
GFR, Estimated: 28 mL/min — ABNORMAL LOW (ref >60)
Glucose, Bld: 93 mg/dL (ref 70–99)
Potassium: 3.3 mmol/L — ABNORMAL LOW (ref 3.5–5.1)
Sodium: 131 mmol/L — ABNORMAL LOW (ref 135–145)
Total Bilirubin: 0.6 mg/dL (ref 0.0–1.2)
Total Protein: 7.5 g/dL (ref 6.5–8.1)

## 2024-03-30 MED ORDER — LACTATED RINGERS IV SOLN: INTRAVENOUS | Status: AC

## 2024-03-30 MED ORDER — ACETAMINOPHEN 650 MG RE SUPP: 650.0000 mg | Freq: Four times a day (QID) | RECTAL | Status: DC | PRN

## 2024-03-30 MED ORDER — HEPARIN SODIUM (PORCINE) 5000 UNIT/ML IJ SOLN
5000.0000 [IU] | Freq: Three times a day (TID) | INTRAMUSCULAR | Status: DC
  Administered 2024-03-30 – 2024-04-01 (×5): 5000 [IU] via SUBCUTANEOUS
  Filled 2024-03-30 (×5): qty 1

## 2024-03-30 MED ORDER — SODIUM CHLORIDE 0.9% FLUSH
3.0000 mL | Freq: Two times a day (BID) | INTRAVENOUS | Status: DC
  Administered 2024-03-30 – 2024-04-01 (×3): 3 mL via INTRAVENOUS

## 2024-03-30 MED ORDER — PROCHLORPERAZINE EDISYLATE 10 MG/2ML IJ SOLN: 5.0000 mg | Freq: Four times a day (QID) | INTRAMUSCULAR | Status: DC | PRN

## 2024-03-30 MED ORDER — ACETAMINOPHEN 325 MG PO TABS: 650.0000 mg | ORAL_TABLET | Freq: Four times a day (QID) | ORAL | Status: DC | PRN

## 2024-03-30 MED ORDER — CLOTRIMAZOLE 1 % EX CREA
TOPICAL_CREAM | Freq: Two times a day (BID) | CUTANEOUS | Status: DC
  Filled 2024-03-30: qty 15

## 2024-03-30 MED ORDER — POTASSIUM CHLORIDE CRYS ER 20 MEQ PO TBCR
20.0000 meq | EXTENDED_RELEASE_TABLET | Freq: Once | ORAL | Status: AC
  Administered 2024-03-30: 20 meq via ORAL
  Filled 2024-03-30: qty 1

## 2024-03-30 MED ORDER — LACTATED RINGERS IV BOLUS
1000.0000 mL | Freq: Once | INTRAVENOUS | Status: AC
  Administered 2024-03-30: 1000 mL via INTRAVENOUS

## 2024-03-30 MED ORDER — ASPIRIN 81 MG PO TBEC
81.0000 mg | DELAYED_RELEASE_TABLET | Freq: Every day | ORAL | Status: DC
  Administered 2024-03-31 – 2024-04-01 (×2): 81 mg via ORAL
  Filled 2024-03-30 (×2): qty 1

## 2024-03-30 NOTE — ED Notes (Signed)
 Transport to CT

## 2024-03-30 NOTE — Plan of Care (Signed)

## 2024-03-30 NOTE — ED Triage Notes (Signed)
 Feeling sob and weakness. Feverish, chills no appetite, Was at event in [place] on Tuesday,  Since Tuesday Some confusion  Low WBC in recent blood work,  Seen at [facility] for similar started on abt keflex, unsure if she started it  Groin rash, ear pain  Normally very active and indepenent

## 2024-03-30 NOTE — Plan of Care (Signed)
 Lady presented to [facility] due to shortness of breath fevers and decreased appetite was found to have numbness in the right leg as well as fatigue and a possible rash.  I received signout from Dr. Patsey who requested admission and possible neurologic consultation.  Patient was also found to have AKI and elevated LFTs.  Patient was accepted to [facility] for further workup of leg paresthesias and rash.

## 2024-03-30 NOTE — ED Provider Notes (Signed)
 [place] EMERGENCY DEPARTMENT AT [facility] Provider Note   CSN: [id number] Arrival date & time: 03/30/24  1455     Patient presents with: Shortness of Breath   Gloria Cummings is a 83 y.o. female.    Shortness of Breath Patient presents with peak shortness of breath fevers decreased appetite.  Has had since Tuesday with today being Sunday.  Had been seen in urgent care and had urine to have potential infection although not having dysuria.  Had started on Keflex.  Now having some diarrhea.  Normally very active.  Did have relatively recent blood work with a low white count that had since resolved.  Had positive Epstein-Barr IgG but negative IgM.  Has had pain in the left ear.  Comes and goes.  Occasional confusion.  Normally very active but not been able to do it.  Also states numbness on the right leg.  Has had incredible fatigue.  Also rash developed along groin and right leg.    Past Medical History:  Diagnosis Date   Anemia    Arthritis    Cancer (HCC)    endo metrial   left breast   Cataract    Dyspnea    hx of   Family history of adverse reaction to anesthesia    Mother died following surgery stroke   Family history of breast cancer    Family history of colon cancer    Family history of prostate cancer    Family history of stomach cancer    Heart murmur    History of colon polyps    Hypertension    Occipital neuralgia    Osteoporosis    Pneumonia     Prior to Admission medications   Medication Sig Start Date End Date Taking? Authorizing Provider  aspirin  EC 81 MG tablet Take 81 mg by mouth daily.    [provider]  calcium carbonate (TUMS EX) 750 MG chewable tablet Chew 1 tablet by mouth daily. Dc 3 2000    [provider]  cephALEXin (KEFLEX) 250 MG capsule Take 1 capsule (250 mg total) by mouth 2 (two) times daily for 7 days. 03/28/24 04/04/24  Mayer, Jodi R, NP  Cholecalciferol (VITAMIN D) 2000 units tablet Take 2,000 Units  by mouth daily.    [provider]  fish oil-omega-3 fatty acids 1000 MG capsule Take 4 g by mouth daily.     [provider]  magnesium aspartate (MAGINEX) 615 MG tablet Take 400 mg by mouth daily.    [provider]  Multiple Vitamins-Minerals (MULTIVITAMIN WITH MINERALS) tablet Take 1 tablet by mouth daily.    [provider]  Red Yeast Rice Extract 600 MG CAPS Take 1 capsule by mouth daily.    [provider]  telmisartan -hydrochlorothiazide  (MICARDIS  HCT) 80-12.5 MG tablet Take 1 tablet by mouth daily. 09/12/23   Jodie Lavern LITTIE, MD    Allergies: Lubricants, Betadine [povidone iodine], Lisinopril, and Povidone-iodine    Review of Systems  Respiratory:  Positive for shortness of breath.     Updated Vital Signs BP (!) 145/63 (BP Location: Right Arm)   Pulse 72   Temp 98.4 F (36.9 C) (Oral)   Resp 16   Ht 5' 5 (1.651 m)   Wt 54.9 kg   SpO2 96%   BMI 20.14 kg/m   Physical Exam Vitals reviewed.  HENT:     Head: Atraumatic.     Comments: Left TM normal.  Cardiovascular:  Rate and Rhythm: Regular rhythm.  Pulmonary:     Breath sounds: Normal breath sounds. No wheezing, rhonchi or rales.  Abdominal:     Tenderness: There is no abdominal tenderness.   Musculoskeletal:     Right lower leg: No edema.     Left lower leg: No edema.   Skin:    Comments: Along the anterior waistline there is a erythematous band.  No induration.  It does blanch.  Also goes down to the right pubic area and somewhat to the inguinal area.  Also similar area near her knee.   Neurological:     Mental Status: She is alert.     Comments: Strength appears intact in bilateral lower extremities easier leg raise on left compared to right.  Decreased sensation over anterior thigh on right.  Sensation intact in feet.    (all labs ordered are listed, but only abnormal results are displayed) Labs Reviewed  COMPREHENSIVE METABOLIC PANEL WITH GFR - Abnormal;  Notable for the following components:      Result Value   Sodium 131 (*)    Potassium 3.3 (*)    Chloride 90 (*)    BUN 38 (*)    Creatinine, Ser 1.76 (*)    Calcium 10.6 (*)    AST 94 (*)    ALT 117 (*)    Alkaline Phosphatase 190 (*)    GFR, Estimated 28 (*)    All other components within normal limits  CBC WITH DIFFERENTIAL/PLATELET - Abnormal; Notable for the following components:   HCT 34.8 (*)    All other components within normal limits  CK  HEPATITIS PANEL, ACUTE  COMPREHENSIVE METABOLIC PANEL WITH GFR  MAGNESIUM  CBC    EKG: None  Radiology: CT Head Wo Contrast Result Date: 03/30/2024 CLINICAL DATA:  Headache, neuro deficit Weakness. EXAM: CT HEAD WITHOUT CONTRAST TECHNIQUE: Contiguous axial images were obtained from the base of the skull through the vertex without intravenous contrast. RADIATION DOSE REDUCTION: This exam was performed according to the departmental dose-optimization program which includes automated exposure control, adjustment of the mA and/or kV according to patient size and/or use of iterative reconstruction technique. COMPARISON:  None Available. FINDINGS: Brain: No intracranial hemorrhage, mass effect, or midline shift. Normal brain volume for age. No hydrocephalus. The basilar cisterns are patent. Minor chronic small vessel ischemia. No evidence of territorial infarct or acute ischemia. No extra-axial or intracranial fluid collection. Vascular: Atherosclerosis of skullbase vasculature without hyperdense vessel or abnormal calcification. Skull: No fracture or focal lesion. Sinuses/Orbits: Subtotal opacification of right mastoid air cells. Paranasal sinuses are clear. Other: None. IMPRESSION: 1. No acute intracranial abnormality. 2. Minor chronic small vessel ischemia. 3. Subtotal opacification of right mastoid air cells. Electronically Signed   By: Andrea Gasman M.D.   On: 03/30/2024 16:46   DG Chest 2 View Result Date: 03/30/2024 CLINICAL DATA:   weakness EXAM: CHEST - 2 VIEW COMPARISON:  02/06/2017 FINDINGS: Lungs are clear. Heart size and mediastinal contours are within normal limits. Aortic Atherosclerosis (ICD10-170.0). No effusion. Visualized bones unremarkable. IMPRESSION: No acute cardiopulmonary disease. Electronically Signed   By: JONETTA Faes M.D.   On: 03/30/2024 16:41     Procedures   Medications Ordered in the ED  aspirin  EC tablet 81 mg (has no administration in time range)  heparin injection 5,000 Units (has no administration in time range)  sodium chloride flush (NS) 0.9 % injection 3 mL (has no administration in time range)  clotrimazole (LOTRIMIN) 1 % cream (  has no administration in time range)  potassium chloride SA (KLOR-CON M) CR tablet 20 mEq (has no administration in time range)  acetaminophen  (TYLENOL ) tablet 650 mg (has no administration in time range)    Or  acetaminophen  (TYLENOL ) suppository 650 mg (has no administration in time range)  lactated ringers  infusion (has no administration in time range)  prochlorperazine (COMPAZINE) injection 5 mg (has no administration in time range)  lactated ringers  bolus 1,000 mL (1,000 mLs Intravenous New Bag/Given 03/30/24 1740)                                    Medical Decision Making Amount and/or Complexity of Data Reviewed Labs: ordered. Radiology: ordered.  Risk Decision regarding hospitalization.   Patient with multiple complaints.  Fatigue fevers recent urinalysis showed potential infection but culture was negative.  Had been on Keflex.  More fatigue.  Reviewed urgent care note and PCP blood work.  Has ear pain and headaches.  Will get head CT.  Will get chest x-ray.  Chest x-ray reassuring.  Creatinine now increased.  Does have elevated LFTs also that are new.  With decreased oral intake fatigue laboratory abnormalities and paresthesias I feel patient benefit from admission to the hospital.  Will discuss with hospitalist for admission.  With GFR  less than 30 will get ultrasound to evaluate abdomen.  Hospitalist accepted for admission.  Also had paresthesias on right leg which will need to be evaluated.        Final diagnoses:  Fatigue, unspecified type  AKI (acute kidney injury) [facility])  Transaminitis    ED Discharge Orders     None          Patsey Lot, MD 03/30/24 2213

## 2024-03-30 NOTE — ED Notes (Signed)
 Unsuccessful attempt iv insertion to right forearm

## 2024-03-30 NOTE — H&P (Signed)
 History and Physical    Gloria Cummings FMW:[id number] DOB: 01-27-1941 DOA: 03/30/2024  PCP: Jodie Lavern CROME, MD   Patient coming from: Home   Chief Complaint: Loss of appetite, fatigue, right leg numbness, rash   HPI: Gloria Cummings is a 83 y.o. female with medical history significant for hypertension, osteoporosis, and breast cancer who presents with severe fatigue, right leg numbness, and groin rash.  Patient developed severe fatigue and loss of appetite on 03/25/2024.  She denies any abdominal pain, nausea, vomiting, or diarrhea associated with this.  There has not been any urinary symptoms.  She also reports numbness involving the anterior right thigh from the hip to the knee without any preceding trauma, back pain, or change in bowel and bladder habits.  Additionally, there has been a faint red rash under her waistband and in the bilateral groin without itching.  Patient reports that she is feeling much better after receiving IV fluids in the ED, has regained her appetite, and ate half an omelette prior to transfer to [facility].  ED Course: Upon arrival to the ED, patient is found to be afebrile and saturating well on room air with normal HR and stable BP.  Labs are most notable for sodium 131, potassium 3.3, creatinine 1.76, AST 94, ALT 117, normal bilirubin, and normal WBC.  No acute findings noted on chest x-ray or head CT.  Patient was given a liter of LR and transferred to [facility] for admission.  Review of Systems:  All other systems reviewed and apart from HPI, are negative.  Past Medical History:  Diagnosis Date   Anemia    Arthritis    Cancer (HCC)    endo metrial   left breast   Cataract    Dyspnea    hx of   Family history of adverse reaction to anesthesia    Mother died following surgery stroke   Family history of breast cancer    Family history of colon cancer    Family history of prostate cancer    Family history of stomach cancer     Heart murmur    History of colon polyps    Hypertension    Occipital neuralgia    Osteoporosis    Pneumonia     Past Surgical History:  Procedure Laterality Date   BREAST LUMPECTOMY     left lumpectomy   COLON SURGERY     procedures   EYE SURGERY     implants on both eyes   FOOT SURGERY     FRACTURE SURGERY     ROBOTIC ASSISTED LAP VAGINAL HYSTERECTOMY N/A 02/08/2017   Procedure: XI ROBOTIC ASSISTED LAPAROSCOPIC TOTAL HYSTERECTOMY, BILATERAL SALPINGOOPHORECTOMY;  Surgeon: Eloy Herring, MD;  Location: [facility] ORS;  Service: Gynecology;  Laterality: N/A;   SENTINEL NODE BIOPSY N/A 02/08/2017   Procedure: SENTINEL NODE BIOPSY;  Surgeon: Eloy Herring, MD;  Location: [facility];  Service: Gynecology;  Laterality: N/A;   TUBAL LIGATION     WISDOM TOOTH EXTRACTION      Social History:   reports that she has never smoked. She has never used smokeless tobacco. She reports that she does not drink alcohol and does not use drugs.  Allergies  Allergen Reactions   Lubricants     Water  based lubricant  Unspecified dermatitis   Betadine [Povidone Iodine] Swelling   Lisinopril Cough   Povidone-Iodine Swelling    Family History  Problem Relation Age of Onset   Stroke Mother  Heart disease Father    Stroke Father    Heart disease Brother    Heart disease Maternal Grandmother    Heart disease Maternal Grandfather    Cancer Paternal Grandfather        NOS   Breast cancer Paternal Aunt        dx over 76   Stomach cancer Paternal Uncle    Prostate cancer Paternal Uncle    Cancer Paternal Aunt        possible colon cancer   Breast cancer Cousin        paternal first cousin dx over 26     Prior to Admission medications   Medication Sig Start Date End Date Taking? Authorizing Provider  aspirin  EC 81 MG tablet Take 81 mg by mouth daily.    [provider]  calcium carbonate (TUMS EX) 750 MG chewable tablet Chew 1 tablet by mouth daily. Dc 3 2000    [provider]   cephALEXin (KEFLEX) 250 MG capsule Take 1 capsule (250 mg total) by mouth 2 (two) times daily for 7 days. 03/28/24 04/04/24  Mayer, Jodi R, NP  Cholecalciferol (VITAMIN D) 2000 units tablet Take 2,000 Units by mouth daily.    [provider]  fish oil-omega-3 fatty acids 1000 MG capsule Take 4 g by mouth daily.     [provider]  magnesium aspartate (MAGINEX) 615 MG tablet Take 400 mg by mouth daily.    [provider]  Multiple Vitamins-Minerals (MULTIVITAMIN WITH MINERALS) tablet Take 1 tablet by mouth daily.    [provider]  Red Yeast Rice Extract 600 MG CAPS Take 1 capsule by mouth daily.    [provider]  telmisartan -hydrochlorothiazide  (MICARDIS  HCT) 80-12.5 MG tablet Take 1 tablet by mouth daily. 09/12/23   Jodie Lavern CROME, MD    Physical Exam: Vitals:   03/30/24 1630 03/30/24 1700 03/30/24 1916 03/30/24 2004  BP: 130/78 (!) 122/58 (!) 145/63   Pulse: 66 66 72   Resp:  20 16   Temp:   98.4 F (36.9 C)   TempSrc:   Oral   SpO2: 92% 96% 96%   Weight:    54.9 kg  Height:    5' 5 (1.651 m)    Constitutional: NAD, no pallor or diaphoresis  Eyes: PERTLA, lids and conjunctivae normal ENMT: Mucous membranes are moist. Posterior pharynx clear of any exudate or lesions.   Neck: supple, no masses  Respiratory: no wheezing, no crackles. No accessory muscle use.  Cardiovascular: S1 & S2 heard, regular rate and rhythm. No extremity edema.  Abdomen: No tenderness, soft. Bowel sounds active.  Musculoskeletal: no clubbing / cyanosis. No joint deformity upper and lower extremities.   Skin: faint erythema involving bilateral groin. Warm, dry, well-perfused. Neurologic: CN 2-12 grossly intact aside from hearing deficit. Sensation to light touch diminished in anterior right thigh. Strength 5/5 in all 4 limbs. Alert and oriented.  Psychiatric: Calm. Cooperative.    Labs and Imaging on Admission: I have personally reviewed following labs and  imaging studies  CBC: Recent Labs  Lab 03/30/24 1629  WBC 7.6  NEUTROABS 5.8  HGB 12.4  HCT 34.8*  MCV 87.2  PLT 189   Basic Metabolic Panel: Recent Labs  Lab 03/30/24 1629  NA 131*  K 3.3*  CL 90*  CO2 28  GLUCOSE 93  BUN 38*  CREATININE 1.76*  CALCIUM 10.6*   GFR: Estimated Creatinine Clearance: 20.6 mL/min (A) (by C-G formula based on  SCr of 1.76 mg/dL (H)). Liver Function Tests: Recent Labs  Lab 03/30/24 1629  AST 94*  ALT 117*  ALKPHOS 190*  BILITOT 0.6  PROT 7.5  ALBUMIN 4.2   No results for input(s): LIPASE, AMYLASE in the last 168 hours. No results for input(s): AMMONIA in the last 168 hours. Coagulation Profile: No results for input(s): INR, PROTIME in the last 168 hours. Cardiac Enzymes: No results for input(s): CKTOTAL, CKMB, CKMBINDEX, TROPONINI in the last 168 hours. BNP (last 3 results) No results for input(s): PROBNP in the last 8760 hours. HbA1C: No results for input(s): HGBA1C in the last 72 hours. CBG: No results for input(s): GLUCAP in the last 168 hours. Lipid Profile: No results for input(s): CHOL, HDL, LDLCALC, TRIG, CHOLHDL, LDLDIRECT in the last 72 hours. Thyroid  Function Tests: No results for input(s): TSH, T4TOTAL, FREET4, T3FREE, THYROIDAB in the last 72 hours. Anemia Panel: No results for input(s): VITAMINB12, FOLATE, FERRITIN, TIBC, IRON, RETICCTPCT in the last 72 hours. Urine analysis:    Component Value Date/Time   COLORURINE YELLOW 02/06/2017 1402   APPEARANCEUR CLEAR 02/06/2017 1402   LABSPEC 1.014 02/06/2017 1402   PHURINE 7.0 02/06/2017 1402   GLUCOSEU NEGATIVE 02/06/2017 1402   HGBUR SMALL (A) 02/06/2017 1402   BILIRUBINUR negative 03/28/2024 1612   KETONESUR negative 03/28/2024 1612   KETONESUR NEGATIVE 02/06/2017 1402   PROTEINUR =30 (A) 03/28/2024 1612   PROTEINUR NEGATIVE 02/06/2017 1402   UROBILINOGEN 0.2 03/28/2024 1612   UROBILINOGEN 0.2  10/31/2008 1741   NITRITE Negative 03/28/2024 1612   NITRITE NEGATIVE 02/06/2017 1402   LEUKOCYTESUR Small (1+) (A) 03/28/2024 1612   Sepsis Labs: @LABRCNTIP (procalcitonin:4,lacticidven:4) ) Recent Results (from the past 240 hours)  Urine Culture     Status: None   Collection Time: 03/28/24  4:21 PM   Specimen: Urine, Clean Catch  Result Value Ref Range Status   Specimen Description URINE, CLEAN CATCH  Final   Special Requests NONE  Final   Culture   Final    NO GROWTH Performed at [facility], [address] [place], [place] [address]    Report Status 03/30/2024 FINAL  Final     Radiological Exams on Admission: CT Head Wo Contrast Result Date: 03/30/2024 CLINICAL DATA:  Headache, neuro deficit Weakness. EXAM: CT HEAD WITHOUT CONTRAST TECHNIQUE: Contiguous axial images were obtained from the base of the skull through the vertex without intravenous contrast. RADIATION DOSE REDUCTION: This exam was performed according to the departmental dose-optimization program which includes automated exposure control, adjustment of the mA and/or kV according to patient size and/or use of iterative reconstruction technique. COMPARISON:  None Available. FINDINGS: Brain: No intracranial hemorrhage, mass effect, or midline shift. Normal brain volume for age. No hydrocephalus. The basilar cisterns are patent. Minor chronic small vessel ischemia. No evidence of territorial infarct or acute ischemia. No extra-axial or intracranial fluid collection. Vascular: Atherosclerosis of skullbase vasculature without hyperdense vessel or abnormal calcification. Skull: No fracture or focal lesion. Sinuses/Orbits: Subtotal opacification of right mastoid air cells. Paranasal sinuses are clear. Other: None. IMPRESSION: 1. No acute intracranial abnormality. 2. Minor chronic small vessel ischemia. 3. Subtotal opacification of right mastoid air cells. Electronically Signed   By: Andrea Gasman M.D.   On: 03/30/2024 16:46    DG Chest 2 View Result Date: 03/30/2024 CLINICAL DATA:  weakness EXAM: CHEST - 2 VIEW COMPARISON:  02/06/2017 FINDINGS: Lungs are clear. Heart size and mediastinal contours are within normal limits. Aortic Atherosclerosis (ICD10-170.0). No effusion. Visualized  bones unremarkable. IMPRESSION: No acute cardiopulmonary disease. Electronically Signed   By: JONETTA Faes M.D.   On: 03/30/2024 16:41    Assessment/Plan   1. AKI superimposed on CKD 3B  - Likely prerenal in setting of recent anorexia, abdominal US  pending  - Hold Micardis , renally-dose medications, continue IVF hydration, repeat chem panel    2. Right anterior thigh numbness  - No trauma, pain, weakness, or bowel/bladder dysfunction  - ?Femoral nerve neuropathy, meralgia paraesthetica, or L2-4 radiculopathy  - Check lumbar-spine MRI   3. Elevated transaminases  - US  is ordered by ED and pending  - Check CK given the AKI, check viral hepatitis panel given recent illness, repeat CMP in am   4. Intertrigo  - Clotrimazole       5. Hypertension  - Hold Micardis , treat as-needed only for now   6.  Hypokalemia  - Replacing   DVT prophylaxis: sq heparin  Code Status: Full  Level of Care: Level of care: Med-Surg Family Communication: Daughter at bedside  Disposition Plan:  Patient is from: Home  Anticipated d/c is to: Home  Anticipated d/c date is: 04/01/24  Patient currently: Pending abdominal US , improved renal function, additional labs  Consults called: none  Admission status: Inpatient     Evalene GORMAN Sprinkles, MD [facility]  03/30/2024, 9:04 PM

## 2024-03-30 NOTE — Progress Notes (Signed)
 Pt NPO at this time for ultrasound. Pt educated, call bell in reach

## 2024-03-31 ENCOUNTER — Ambulatory Visit (HOSPITAL_COMMUNITY): Payer: Self-pay

## 2024-03-31 ENCOUNTER — Inpatient Hospital Stay (HOSPITAL_COMMUNITY)

## 2024-03-31 DIAGNOSIS — E876 Hypokalemia: Secondary | ICD-10-CM

## 2024-03-31 DIAGNOSIS — R21 Rash and other nonspecific skin eruption: Secondary | ICD-10-CM

## 2024-03-31 DIAGNOSIS — R5383 Other fatigue: Secondary | ICD-10-CM | POA: Diagnosis not present

## 2024-03-31 DIAGNOSIS — R7401 Elevation of levels of liver transaminase levels: Secondary | ICD-10-CM | POA: Diagnosis not present

## 2024-03-31 DIAGNOSIS — R200 Anesthesia of skin: Secondary | ICD-10-CM

## 2024-03-31 DIAGNOSIS — N179 Acute kidney failure, unspecified: Secondary | ICD-10-CM | POA: Diagnosis not present

## 2024-03-31 LAB — COMPREHENSIVE METABOLIC PANEL WITH GFR
ALT: 79 U/L — ABNORMAL HIGH (ref 0–44)
AST: 61 U/L — ABNORMAL HIGH (ref 15–41)
Albumin: 3 g/dL — ABNORMAL LOW (ref 3.5–5.0)
Alkaline Phosphatase: 122 U/L (ref 38–126)
Anion gap: 14 (ref 5–15)
BUN: 34 mg/dL — ABNORMAL HIGH (ref 8–23)
CO2: 25 mmol/L (ref 22–32)
Calcium: 9.2 mg/dL (ref 8.9–10.3)
Chloride: 95 mmol/L — ABNORMAL LOW (ref 98–111)
Creatinine, Ser: 1.33 mg/dL — ABNORMAL HIGH (ref 0.44–1.00)
GFR, Estimated: 39 mL/min — ABNORMAL LOW (ref >60)
Glucose, Bld: 103 mg/dL — ABNORMAL HIGH (ref 70–99)
Potassium: 3.2 mmol/L — ABNORMAL LOW (ref 3.5–5.1)
Sodium: 134 mmol/L — ABNORMAL LOW (ref 135–145)
Total Bilirubin: 0.7 mg/dL (ref 0.0–1.2)
Total Protein: 5.8 g/dL — ABNORMAL LOW (ref 6.5–8.1)

## 2024-03-31 LAB — CBC
HCT: 30.8 % — ABNORMAL LOW (ref 36.0–46.0)
Hemoglobin: 11 g/dL — ABNORMAL LOW (ref 12.0–15.0)
MCH: 31.3 pg (ref 26.0–34.0)
MCHC: 35.7 g/dL (ref 30.0–36.0)
MCV: 87.7 fL (ref 80.0–100.0)
Platelets: 153 10*3/uL (ref 150–400)
RBC: 3.51 MIL/uL — ABNORMAL LOW (ref 3.87–5.11)
RDW: 12.2 % (ref 11.5–15.5)
WBC: 6.9 10*3/uL (ref 4.0–10.5)
nRBC: 0 % (ref 0.0–0.2)

## 2024-03-31 LAB — LACTIC ACID, PLASMA
Lactic Acid, Venous: 0.8 mmol/L (ref 0.5–1.9)
Lactic Acid, Venous: 0.8 mmol/L (ref 0.5–1.9)

## 2024-03-31 LAB — HEPATITIS PANEL, ACUTE
HCV Ab: NONREACTIVE
Hep A IgM: NONREACTIVE
Hep B C IgM: NONREACTIVE
Hepatitis B Surface Ag: NONREACTIVE

## 2024-03-31 LAB — MAGNESIUM: Magnesium: 2.1 mg/dL (ref 1.7–2.4)

## 2024-03-31 LAB — SEDIMENTATION RATE: Sed Rate: 35 mm/h — ABNORMAL HIGH (ref 0–22)

## 2024-03-31 LAB — CK: Total CK: 57 U/L (ref 38–234)

## 2024-03-31 LAB — PHOSPHORUS: Phosphorus: 2.9 mg/dL (ref 2.5–4.6)

## 2024-03-31 MED ORDER — POTASSIUM CHLORIDE CRYS ER 20 MEQ PO TBCR
40.0000 meq | EXTENDED_RELEASE_TABLET | Freq: Two times a day (BID) | ORAL | Status: AC
  Administered 2024-03-31 (×2): 40 meq via ORAL
  Filled 2024-03-31 (×2): qty 2

## 2024-03-31 MED ORDER — METHYLPREDNISOLONE SODIUM SUCC 125 MG IJ SOLR
125.0000 mg | Freq: Once | INTRAMUSCULAR | Status: AC
  Administered 2024-03-31: 125 mg via INTRAVENOUS
  Filled 2024-03-31: qty 2

## 2024-03-31 MED ORDER — LACTATED RINGERS IV SOLN: INTRAVENOUS | Status: AC

## 2024-03-31 NOTE — Plan of Care (Signed)
  Problem: Education: Goal: Knowledge of General Education information will improve Description: Including pain rating scale, medication(s)/side effects and non-pharmacologic comfort measures Outcome: Progressing   Problem: Health Behavior/Discharge Planning: Goal: Ability to manage health-related needs will improve Outcome: Progressing   Problem: Clinical Measurements: Goal: Ability to maintain clinical measurements within normal limits will improve Outcome: Progressing Goal: Will remain free from infection Outcome: Progressing Goal: Diagnostic test results will improve Outcome: Progressing Goal: Respiratory complications will improve Outcome: Progressing Goal: Cardiovascular complication will be avoided Outcome: Progressing   Problem: Nutrition: Goal: Adequate nutrition will be maintained Outcome: Progressing   Problem: Coping: Goal: Level of anxiety will decrease Outcome: Progressing   Problem: Elimination: Goal: Will not experience complications related to bowel motility Outcome: Progressing Goal: Will not experience complications related to urinary retention Outcome: Progressing   Problem: Pain Managment: Goal: General experience of comfort will improve and/or be controlled Outcome: Progressing

## 2024-03-31 NOTE — Evaluation (Signed)
 Occupational Therapy Evaluation Patient Details Name: Gloria Cummings MRN: [id number] DOB: [birth year] Today's Date: 03/31/2024   History of Present Illness   Pt is an 83 y/o F presenting to ED on 6/29 wtih SOB and decr appetite, RLE numbness and rash, admitted for AKI superimposed on CKD IIIB. CXR and CT head negative. PMH includes HTN, osteoporosis, breast CA     Clinical Impressions Pt ind at baseline with ADL/functional mobility, lives alone and enjoys dancing. Pt currently needing supervision overall for ADLs, mod I for transfers without AD, noted R sway x2 during session with hallway ambulation. Pt reports mild numbness in R anterior thigh -- possibly contributing but no overt LOB noted. Pt presenting with impairments listed below, however has no further OT needs at this time, will s/o. Please reconsult if there is a change in pt status.      If plan is discharge home, recommend the following:   Assistance with cooking/housework;Assist for transportation     Functional Status Assessment   Patient has had a recent decline in their functional status and demonstrates the ability to make significant improvements in function in a reasonable and predictable amount of time.     Equipment Recommendations   None recommended by OT     Recommendations for Other Services         Precautions/Restrictions   Precautions Precautions: Fall Restrictions Weight Bearing Restrictions Per Provider Order: No     Mobility Bed Mobility               General bed mobility comments: OOB in chair upon arrival and departure    Transfers Overall transfer level: Modified independent                 General transfer comment: mild R sway x2 with hallway mobility      Balance Overall balance assessment: No apparent balance deficits (not formally assessed)                                         ADL either performed or assessed with clinical  judgement   ADL Overall ADL's : Needs assistance/impaired Eating/Feeding: Set up;Sitting   Grooming: Set up;Standing   Upper Body Bathing: Supervision/ safety;Standing   Lower Body Bathing: Supervison/ safety;Sitting/lateral leans   Upper Body Dressing : Supervision/safety;Standing   Lower Body Dressing: Supervision/safety;Sit to/from stand;Sitting/lateral leans   Toilet Transfer: Supervision/safety;Ambulation;Regular Social worker and Hygiene: Supervision/safety       Functional mobility during ADLs: Supervision/safety       Vision   Vision Assessment?: No apparent visual deficits     Perception Perception: Not tested       Praxis Praxis: Not tested       Pertinent Vitals/Pain Pain Assessment Pain Assessment: No/denies pain     Extremity/Trunk Assessment Upper Extremity Assessment Upper Extremity Assessment: Overall WFL for tasks assessed   Lower Extremity Assessment Lower Extremity Assessment: Defer to PT evaluation (RLE anterior thigh numbness from mid thigh to below knee)   Cervical / Trunk Assessment Cervical / Trunk Assessment: Normal   Communication Communication Communication: No apparent difficulties   Cognition Arousal: Alert Behavior During Therapy: WFL for tasks assessed/performed Cognition: No apparent impairments                               Following  commands: Intact       Cueing  General Comments   Cueing Techniques: Verbal cues  VSS on RA   Exercises     Shoulder Instructions      Home Living Family/patient expects to be discharged to:: Private residence Living Arrangements: Alone Available Help at Discharge: Friend(s);Available PRN/intermittently;Neighbor Type of Home: House Home Access: Stairs to enter Secretary/administrator of Steps: 2   Home Layout: Two level;Bed/bath upstairs     Bathroom Shower/Tub: Walk-in shower (stall shower)         Home Equipment: None           Prior Functioning/Environment Prior Level of Function : Independent/Modified Independent               ADLs Comments: enjoys dancing    OT Problem List: Decreased activity tolerance;Impaired balance (sitting and/or standing)   OT Treatment/Interventions:        OT Goals(Current goals can be found in the care plan section)   Acute Rehab OT Goals Patient Stated Goal: to go home OT Goal Formulation: With patient Time For Goal Achievement: 04/14/24 Potential to Achieve Goals: Good   OT Frequency:       Co-evaluation              AM-PAC OT 6 Clicks Daily Activity     Outcome Measure Help from another person eating meals?: None Help from another person taking care of personal grooming?: None Help from another person toileting, which includes using toliet, bedpan, or urinal?: None Help from another person bathing (including washing, rinsing, drying)?: None Help from another person to put on and taking off regular upper body clothing?: None Help from another person to put on and taking off regular lower body clothing?: None 6 Click Score: 24   End of Session Nurse Communication: Mobility status  Activity Tolerance: Patient tolerated treatment well Patient left: in chair;with call bell/phone within reach  OT Visit Diagnosis: Muscle weakness (generalized) (M62.81)                Time: 8543-8471 OT Time Calculation (min): 32 min Charges:  OT General Charges $OT Visit: 1 Visit OT Evaluation $OT Eval Low Complexity: 1 Low OT Treatments $Self Care/Home Management : 8-22 mins  Jen Eppinger K, OTD, OTR/L SecureChat Preferred Acute Rehab [phone]   Suzannah Bettes K Koonce 03/31/2024, 4:06 PM

## 2024-03-31 NOTE — TOC CM/SW Note (Signed)
 Transition of Care [facility]) - Inpatient Brief Assessment   Patient Details  Name: Gloria Cummings MRN: [id number] Date of Birth: August 23, 1941  Transition of Care [facility]) CM/SW Contact:    Tom-Johnson, Harvest Muskrat, RN Phone Number: 03/31/2024, 3:34 PM   Clinical Narrative:  Patient presented to the ED with Generalized weakness, Fatigue, Fever, Rt Leg numbness, Rash to Waistband, bilateral Groin Rash, and loss of Appetite.  Has hx of Osteoporosis, Breast Cancer, and HTN. Patient admitted with AKI.  From home alone, has a supportive daughter who is at bedside. Retired, independent with care and drives self. Does not have DME's at home.  PCP is Jodie Lavern LITTIE, MD and uses [facility].  No TOC needs or recommendations noted at this time.  Patient not Medically ready for discharge.  CM will continue to follow as patient progresses with care towards discharge.               Transition of Care Asessment: Insurance and Status: Insurance coverage has been reviewed Patient has primary care physician: Yes Home environment has been reviewed: Yes Prior level of function:: Independent Prior/Current Home Services: No current home services Social Drivers of Health Review: SDOH reviewed no interventions necessary Readmission risk has been reviewed: Yes Transition of care needs: no transition of care needs at this time

## 2024-03-31 NOTE — Hospital Course (Addendum)
 HPI per Dr. Charlton: Gloria Cummings is a 83 y.o. female with medical history significant for hypertension, osteoporosis, and breast cancer who presents with severe fatigue, right leg numbness, and groin rash.   Patient developed severe fatigue and loss of appetite on 03/25/2024.  She denies any abdominal pain, nausea, vomiting, or diarrhea associated with this.  There has not been any urinary symptoms.  She also reports numbness involving the anterior right thigh from the hip to the knee without any preceding trauma, back pain, or change in bowel and bladder habits.  Additionally, there has been a faint red rash under her waistband and in the bilateral groin without itching.   Patient reports that she is feeling much better after receiving IV fluids in the ED, has regained her appetite, and ate half an omelette prior to transfer to [facility].   **Interim History She has a rash and numbness in her leg.  Rash was getting worse and spread to her knee and starting to spread on her back and had improved with the administration of Solu-Medrol..  [place] so may have been bitten by an insect.   She is back to her baseline and appetite is improved.  Labs are improved and she is stable for discharge at this time and will need follow-up and close monitoring and further evaluation in the outpatient setting with dermatology for possible biopsy this rash for further evaluation.  ** After the time of discharge and the patient leaving the hospital her Lyme titers came back and she is positive for Lyme disease.  I called the patient to inform her after her discharge as well as her daughter to inform her of the results.  Antibiotics with p.o. 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 Associated with Rash. Ddx include ?Femoral nerve neuropathy, meralgia paraesthetica, or L2-4 radiculopathy. Checked Lumbar-spine MRI and showed No acute osseous abnormality in the lumbar spine. Lower thoracic and lumbar spine degeneration but capacious underlying spinal canal. No subsequent spinal or convincing lateral recess stenosis. Moderate degenerative neural foraminal stenosis at the right L4 and right L5 nerve levels.  Follow-up in the outpatient setting if persistent will need further neurological evaluation if not improved with Abx  Abnormal LFTs / Elevated transaminases, improving: RUQ U/S negative and Acute Hepatitis Panel negative. Checked CK given the AKI and was 57; IVF hydration as above. AST went from 94 -> 61 -> 37 and ALT went from 117 -> 79 -> 63. CTM and Trend and repeat CMP in the AM  Subjective Fever / Fatigue/ Loss of Appetite / Poor Po Intake in the Setting of Lyme Disease: PT/OT to evaluate and Treat and recommended Follow up. Was Being worked up in the outpt setting. EBV VCA antibody panel was checked and has Abx against this.  Human parvo DNA detection by PSR was also checked.  Had some neutropenia earlier this month which had been since resolved.  Placed on Keflex in the outpatient setting which we will hold for now  and stop at discharge.  ESR is elevated at 35 and CRP is elevated at 10.2.  Had some viral pharyngitis initially thought given that she had a sore throat but the sore throat is now resolved.  She does not have a lactic acidosis and was 0.8 x 2.  Will obtain nutritionist consultation if necessary.  She is improved and her appetite is back to her baseline.  Will  need to follow-up with PCP within 1 to 2 weeks and repeat lab work at that time  Ear Ringing: Resolved.  Had a head CT which showed no acute intracranial abnormalities but did show some minor chronic small vessel ischemia.  Rash / Intertrigo: C/w Clotrimazole. Given Solumedrol 125 mg x2 while hospitalized and placed on a steroid taper but recommended to STOP Steroids and discussed with the patient's daughter and patient after discharge to not take the Steroid taper given.  -There wasConcern for Tick Borne ilness constellation of symptoms of fatigue, poor appetite, rash is worsening and given that visited [place] . Rash does not itch. ? Drug Reaction vs Fungal Infxn. Checked for Lyme and RMSF and will need to follow-up on these titers and Lyme came back POSITIVE after she was discharged. Unlikely Autoimmune given recent ANA being Negative. Will need outpatient evaluation by dermatology and consider biopsy if rash fails to improve. Called the patient in Doxycycline 100 mg BID x21 Days given Rash and Associated Radiculoneuritis. Recommending STOPPING STEROIDS altogether for now.   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 BUN/Cr Trend: Recent Labs  Lab 03/10/24 1528 03/30/24 1629 03/31/24 0017 04/01/24 0527  BUN 25* 38* 34* 24*  CREATININE 1.26* 1.76* 1.33* 1.06*  -C/w IVF Hydration w/ LR @ 75 mL/hr. Avoid Nephrotoxic Medications and hold Micardis , Contrast Dyes, Hypotension and Dehydration to Ensure Adequate Renal Perfusion and will need to Renally Adjust Meds. CTM and Trend Renal Function carefully and repeat CMP within 1 week  HypoNatremia: Na+ went from 131 -> 134 and is now 137.SABRA CTM and Trend and repeat CMP in the AM   Essential Hypertension: Hold Micardis  for now and resume at discharge, treat as-needed hospitalized   Hypokalemia:  K+ was 3.2 and is now 4.3. CTM and Trend and  repeat CMP in the AM  Normocytic Anemia: Hgb/Hct is now 12.0/34.2. Checked Anemia Panel and showed an iron level of 106, UIBC 146, TIBC 252, saturation percent 42%, ferritin low 213, folate level 20.3 and vitamin B12 of 3171. CTM for S/Sx of Bleeding; No overt bleeding noted. Repeat CBC in within 1 week  Hypoalbuminemia: Patient's Albumin Level went from 4.9 -> 4.2 -> 3.0. CTM and Trend and repeat CMP within 1 week

## 2024-03-31 NOTE — Progress Notes (Signed)
 PROGRESS NOTE    Gloria Cummings  FMW:[id number] DOB: [birth year] DOA: 03/30/2024 PCP: Jodie Lavern CROME, MD   Brief Narrative:  HPI per Dr. Charlton: Gloria Cummings is a 83 y.o. female with medical history significant for hypertension, osteoporosis, and breast cancer who presents with severe fatigue, right leg numbness, and groin rash.   Patient developed severe fatigue and loss of appetite on 03/25/2024.  She denies any abdominal pain, nausea, vomiting, or diarrhea associated with this.  There has not been any urinary symptoms.  She also reports numbness involving the anterior right thigh from the hip to the knee without any preceding trauma, back pain, or change in bowel and bladder habits.  Additionally, there has been a faint red rash under her waistband and in the bilateral groin without itching.   Patient reports that she is feeling much better after receiving IV fluids in the ED, has regained her appetite, and ate half an omelette prior to transfer to [facility].   **Interim History She has a rash and numbness in her leg.  Rash is getting worse and spread to her knee and starting to spread on her back.  [place] so may have been bitten by an insect.   Assessment and Plan:  AKI superimposed on CKD 3B: Likely prerenal in setting of recent anorexia, abdominal US  unremarkable. BUN/Cr Trend: Recent Labs  Lab 03/10/24 1528 03/30/24 1629 03/31/24 0017  BUN 25* 38* 34*  CREATININE 1.26* 1.76* 1.33*  -C/w IVF Hydration w/ LR @ 75 mL/hr. Avoid Nephrotoxic Medications and hold Micardis , Contrast Dyes, Hypotension and Dehydration to Ensure Adequate Renal Perfusion and will need to Renally Adjust Meds. CTM and Trend Renal Function carefully and repeat CMP in the AM    Right anterior thigh numbness:  No trauma, pain, weakness, or bowel/bladder dysfunction. Associated with Rash.  ?Femoral nerve neuropathy, meralgia paraesthetica, or L2-4  radiculopathy. Checked Lumbar-spine MRI and showed No acute osseous abnormality in the lumbar spine. Lower thoracic and lumbar spine degeneration but capacious underlying spinal canal. No subsequent spinal or convincing lateral recess stenosis. Moderate degenerative neural foraminal stenosis at the right L4 and right L5 nerve levels.  Abnormal LFTs / Elevated transaminases, improving: RUQ U/S negative and Acute Hepatitis Panel negative. Checked CK given the AKI and was 57; IVF hydration as above. AST went from 94 -> 61 and ALT went from 117 -> 79. CTM and Trend and repeat CMP in the AM  Subjective Fever / Fatigue/ Loss of Appetite / Poor Po Intake: PT/OT to evaluate and Treat. Being worked up in the outpt setting. EBV VCA antibody panel was checked and has Abx against this.  Human parvo DNA detection by PSR was also checked.  Had some neutropenia earlier this month which had been since resolved.  Placed on Keflex in the outpatient setting which we will hold for now.  ESR is elevated.  Had some viral pharyngitis initially thought given that she had a sore throat but the sore throat is now resolved.  She does not have a lactic acidosis and was 0.8 x 2  Ear Ringing: Resolved.  Had a head CT which showed no acute intracranial abnormalities but did show some minor chronic small vessel ischemia.  Rash / Intertrigo: C/w Clotrimazole. Give Solumedrol. Concern for ? Tick Borne ilness constellation of symptoms of fatigue, poor appetite, rash is worsening and given that visited [place] . Rash does not itch. ? Drug Reaction vs Fungal  Infxn. Check for Lyme and RMSF. Unlikely Autoimmune given recent ANA being Negative.  HypoNatremia: Na+ went from 131 -> 134. CTM and Trend and repeat CMP in the AM   Essential Hypertension: Hold Micardis , treat as-needed only for now    Hypokalemia:  K+ was 3.2. Replete w/ po KCL 40 mEQ BID. CTM and Trend and repeat CMP in the AM  Normocytic Anemia: Hgb/Hct is now 11.0/30.8.  Check Anemia Panel in the AM. CTM for S/Sx of Bleeding; No overt bleeding noted. Repeat CBC in the AM   Hypoalbuminemia: Patient's Albumin Level went from 4.9 -> 4.2 -> 3.0. CTM and Trend and repeat CMP in the AM   DVT prophylaxis: heparin injection 5,000 Units Start: 03/30/24 2200    Code Status: Full Code Family Communication: Discussed with the daughter at bedside  Disposition Plan:  Level of care: Med-Surg Status is: Inpatient Remains inpatient appropriate because: Needs further clinical improvement and evaluation by PT OT as well as some improvement in her rash and her labs.   Consultants:  None  Procedures:  As delineated as above  Antimicrobials:  Anti-infectives (From admission, onward)    None       Subjective: Seen and examined at bedside states that she is doing better from eating standpoint and thinks her appetite may be coming back down.SABRA  Has a rash and states it does not itch.  States that she has gotten fairly weak.  No other concerns or complaints at this time.  Objective: Vitals:   03/31/24 0917 03/31/24 0918 03/31/24 1706 03/31/24 2030  BP: 139/63 121/81 (!) 107/58 130/68  Pulse: 77 77 74 70  Resp: 18 18 18 18   Temp: 98.2 F (36.8 C)  98 F (36.7 C) 97.8 F (36.6 C)  TempSrc:      SpO2: 99% 97% 98% 96%  Weight:      Height:        Intake/Output Summary (Last 24 hours) at 03/31/2024 2117 Last data filed at 03/31/2024 0300 Gross per 24 hour  Intake 128.18 ml  Output --  Net 128.18 ml   Filed Weights   03/30/24 2004  Weight: 54.9 kg   Examination: Physical Exam:  Constitutional: Elderly Caucasian female in no acute distress Respiratory: Diminished to auscultation bilaterally, no wheezing, rales, rhonchi or crackles. Normal respiratory effort and patient is not tachypenic. No accessory muscle use.  Unlabored breathing Cardiovascular: RRR, no murmurs / rubs / gallops. S1 and S2 auscultated. No extremity edema.  Abdomen: Soft, non-tender,  non-distended. Bowel sounds positive.  GU: Deferred. Musculoskeletal: No clubbing / cyanosis of digits/nails. No joint deformity upper and lower extremities.  Skin: Left splotchy mildly erythematous rash in her groin, inner thigh, right knee and little bit on her back Neurologic: CN 2-12 grossly intact with no focal deficits. Romberg sign and cerebellar reflexes not assessed.  Psychiatric: Normal judgment and insight. Alert and oriented x 3. Normal mood and appropriate affect.   Data Reviewed: I have personally reviewed following labs and imaging studies  CBC: Recent Labs  Lab 03/30/24 1629 03/31/24 0017  WBC 7.6 6.9  NEUTROABS 5.8  --   HGB 12.4 11.0*  HCT 34.8* 30.8*  MCV 87.2 87.7  PLT 189 153   Basic Metabolic Panel: Recent Labs  Lab 03/30/24 1629 03/31/24 0017 03/31/24 1000  NA 131* 134*  --   K 3.3* 3.2*  --   CL 90* 95*  --   CO2 28 25  --   GLUCOSE 93 103*  --  BUN 38* 34*  --   CREATININE 1.76* 1.33*  --   CALCIUM 10.6* 9.2  --   MG  --  2.1  --   PHOS  --   --  2.9   GFR: Estimated Creatinine Clearance: 27.3 mL/min (A) (by C-G formula based on SCr of 1.33 mg/dL (H)). Liver Function Tests: Recent Labs  Lab 03/30/24 1629 03/31/24 0017  AST 94* 61*  ALT 117* 79*  ALKPHOS 190* 122  BILITOT 0.6 0.7  PROT 7.5 5.8*  ALBUMIN 4.2 3.0*   No results for input(s): LIPASE, AMYLASE in the last 168 hours. No results for input(s): AMMONIA in the last 168 hours. Coagulation Profile: No results for input(s): INR, PROTIME in the last 168 hours. Cardiac Enzymes: Recent Labs  Lab 03/31/24 0017  CKTOTAL 57   BNP (last 3 results) No results for input(s): PROBNP in the last 8760 hours. HbA1C: No results for input(s): HGBA1C in the last 72 hours. CBG: No results for input(s): GLUCAP in the last 168 hours. Lipid Profile: No results for input(s): CHOL, HDL, LDLCALC, TRIG, CHOLHDL, LDLDIRECT in the last 72 hours. Thyroid  Function  Tests: No results for input(s): TSH, T4TOTAL, FREET4, T3FREE, THYROIDAB in the last 72 hours. Anemia Panel: No results for input(s): VITAMINB12, FOLATE, FERRITIN, TIBC, IRON, RETICCTPCT in the last 72 hours. Sepsis Labs: Recent Labs  Lab 03/31/24 1330 03/31/24 1533  LATICACIDVEN 0.8 0.8   Recent Results (from the past 240 hours)  Urine Culture     Status: None   Collection Time: 03/28/24  4:21 PM   Specimen: Urine, Clean Catch  Result Value Ref Range Status   Specimen Description URINE, CLEAN CATCH  Final   Special Requests NONE  Final   Culture   Final    NO GROWTH Performed at [facility], [address] [place], [place] [address]    Report Status 03/30/2024 FINAL  Final    Radiology Studies: MR LUMBAR SPINE WO CONTRAST Result Date: 03/31/2024 CLINICAL DATA:  83 year old female with Myelopathy. EXAM: MRI LUMBAR SPINE WITHOUT CONTRAST TECHNIQUE: Multiplanar, multisequence MR imaging of the lumbar spine was performed. No intravenous contrast was administered. COMPARISON:  CT Abdomen and Pelvis 06/13/2022. FINDINGS: Segmentation:  Normal on the comparison. Alignment: Stable chronic straightening of lumbar lordosis, mild underlying dextroconvex lumbar scoliosis. No significant spondylolisthesis. Vertebrae: Maintained vertebral body height. Normal background bone marrow signal. Chronic but progressed multilevel degenerative vertebral endplate Schmorl's nodes from T12 through the lumbar spine. Superimposed benign T12 vertebral hemangiomas (normal variant). Intact visible sacrum and SI joints. No marrow edema or evidence of acute osseous abnormality. Conus medullaris and cauda equina: Conus extends to the L1-L2 level. No lower spinal cord or conus signal abnormality. Capacious spinal canal and generally normal cauda equina nerve roots throughout. Paraspinal and other soft tissues: Visible abdominal viscera appear stable from the prior CT Abdomen and Pelvis. Negative  visualized posterior paraspinal soft tissues. Disc levels: Lower thoracic spine disc and endplate degeneration T9-T10 and T10-T11 without stenosis. T11-T12 and T12-L1 are normal for age. L1-L2: Small paracentral and caudal disc extrusion with annular fissure (series 2, image 10 and series 5, image 13). No stenosis. L2-L3: Mild disc space loss and circumferential disc bulge. Broad-based posterior component asymmetric to the right. Mild facet, mild to moderate ligament flavum hypertrophy. No spinal or lateral recess stenosis. Mild L2 foraminal stenosis. L3-L4: Disc space loss. Circumferential disc osteophyte complex. Mild to moderate posterior element hypertrophy. No spinal or lateral recess stenosis.  Mild bilateral L3 foraminal stenosis. L4-L5: Similar disc space loss, circumferential disc osteophyte complex. Mild posterior element hypertrophy. No spinal or lateral recess stenosis. Moderate right L4 neural foraminal stenosis. L5-S1: Similar disc space loss, circumferential disc osteophyte complex. Mild facet, moderate ligament flavum hypertrophy. No spinal or lateral recess stenosis. Mild left and moderate right L5 foraminal stenosis. IMPRESSION: 1. No acute osseous abnormality in the lumbar spine. 2. Lower thoracic and lumbar spine degeneration but capacious underlying spinal canal. No subsequent spinal or convincing lateral recess stenosis. 3. Moderate degenerative neural foraminal stenosis at the right L4 and right L5 nerve levels. Electronically Signed   By: VEAR Hurst M.D.   On: 03/31/2024 08:44   US  Abdomen Complete Result Date: 03/31/2024 CLINICAL DATA:  Increased creatinine, fatigue, elevated LFTs EXAM: ABDOMEN ULTRASOUND COMPLETE COMPARISON:  CT abdomen pelvis 06/13/2022 FINDINGS: Gallbladder: No gallstones or wall thickening visualized. No sonographic Murphy sign noted by sonographer. Common bile duct: Diameter: 3 mm. Liver: Benign cyst in the right hepatic lobe. Within normal limits in parenchymal  echogenicity. Portal vein is patent on color Doppler imaging with normal direction of blood flow towards the liver. IVC: No abnormality visualized. Pancreas: Visualized portion unremarkable. Spleen: Size and appearance within normal limits. Right Kidney: Length: 10.1 cm. Echogenicity within normal limits. No mass or hydronephrosis visualized. Left Kidney: Length: 8.6 cm. Echogenicity within normal limits. No mass or hydronephrosis visualized. Abdominal aorta: No aneurysm visualized. Other findings: None. IMPRESSION: Unremarkable abdominal ultrasound. Electronically Signed   By: Norman Gatlin M.D.   On: 03/31/2024 02:04   CT Head Wo Contrast Result Date: 03/30/2024 CLINICAL DATA:  Headache, neuro deficit Weakness. EXAM: CT HEAD WITHOUT CONTRAST TECHNIQUE: Contiguous axial images were obtained from the base of the skull through the vertex without intravenous contrast. RADIATION DOSE REDUCTION: This exam was performed according to the departmental dose-optimization program which includes automated exposure control, adjustment of the mA and/or kV according to patient size and/or use of iterative reconstruction technique. COMPARISON:  None Available. FINDINGS: Brain: No intracranial hemorrhage, mass effect, or midline shift. Normal brain volume for age. No hydrocephalus. The basilar cisterns are patent. Minor chronic small vessel ischemia. No evidence of territorial infarct or acute ischemia. No extra-axial or intracranial fluid collection. Vascular: Atherosclerosis of skullbase vasculature without hyperdense vessel or abnormal calcification. Skull: No fracture or focal lesion. Sinuses/Orbits: Subtotal opacification of right mastoid air cells. Paranasal sinuses are clear. Other: None. IMPRESSION: 1. No acute intracranial abnormality. 2. Minor chronic small vessel ischemia. 3. Subtotal opacification of right mastoid air cells. Electronically Signed   By: Andrea Gasman M.D.   On: 03/30/2024 16:46   DG Chest 2  View Result Date: 03/30/2024 CLINICAL DATA:  weakness EXAM: CHEST - 2 VIEW COMPARISON:  02/06/2017 FINDINGS: Lungs are clear. Heart size and mediastinal contours are within normal limits. Aortic Atherosclerosis (ICD10-170.0). No effusion. Visualized bones unremarkable. IMPRESSION: No acute cardiopulmonary disease. Electronically Signed   By: JONETTA Faes M.D.   On: 03/30/2024 16:41   Scheduled Meds:  aspirin  EC  81 mg Oral Daily   clotrimazole   Topical BID   heparin  5,000 Units Subcutaneous Q8H   potassium chloride  40 mEq Oral BID   sodium chloride flush  3 mL Intravenous Q12H   Continuous Infusions:  lactated ringers  75 mL/hr at 03/31/24 1652    LOS: 1 day   Alejandro Marker, DO [facility] Available via Epic secure chat 7am-7pm After these hours, please refer to coverage provider listed on [email or web address] 03/31/2024,  9:17 PM

## 2024-04-01 ENCOUNTER — Other Ambulatory Visit: Payer: Self-pay | Admitting: Internal Medicine

## 2024-04-01 DIAGNOSIS — N179 Acute kidney failure, unspecified: Secondary | ICD-10-CM | POA: Diagnosis not present

## 2024-04-01 DIAGNOSIS — A6922 Other neurologic disorders in Lyme disease: Secondary | ICD-10-CM

## 2024-04-01 DIAGNOSIS — R7401 Elevation of levels of liver transaminase levels: Secondary | ICD-10-CM | POA: Diagnosis not present

## 2024-04-01 DIAGNOSIS — A6920 Lyme disease, unspecified: Secondary | ICD-10-CM

## 2024-04-01 DIAGNOSIS — E876 Hypokalemia: Secondary | ICD-10-CM | POA: Diagnosis not present

## 2024-04-01 LAB — PHOSPHORUS: Phosphorus: 2.6 mg/dL (ref 2.5–4.6)

## 2024-04-01 LAB — COMPREHENSIVE METABOLIC PANEL WITH GFR
ALT: 63 U/L — ABNORMAL HIGH (ref 0–44)
AST: 37 U/L (ref 15–41)
Albumin: 3 g/dL — ABNORMAL LOW (ref 3.5–5.0)
Alkaline Phosphatase: 117 U/L (ref 38–126)
Anion gap: 8 (ref 5–15)
BUN: 24 mg/dL — ABNORMAL HIGH (ref 8–23)
CO2: 25 mmol/L (ref 22–32)
Calcium: 9 mg/dL (ref 8.9–10.3)
Chloride: 104 mmol/L (ref 98–111)
Creatinine, Ser: 1.06 mg/dL — ABNORMAL HIGH (ref 0.44–1.00)
GFR, Estimated: 52 mL/min — ABNORMAL LOW (ref >60)
Glucose, Bld: 155 mg/dL — ABNORMAL HIGH (ref 70–99)
Potassium: 4.3 mmol/L (ref 3.5–5.1)
Sodium: 137 mmol/L (ref 135–145)
Total Bilirubin: 0.6 mg/dL (ref 0.0–1.2)
Total Protein: 6.4 g/dL — ABNORMAL LOW (ref 6.5–8.1)

## 2024-04-01 LAB — CBC WITH DIFFERENTIAL/PLATELET
Abs Immature Granulocytes: 0.04 10*3/uL (ref 0.00–0.07)
Basophils Absolute: 0 10*3/uL (ref 0.0–0.1)
Basophils Relative: 0 %
Eosinophils Absolute: 0 10*3/uL (ref 0.0–0.5)
Eosinophils Relative: 0 %
HCT: 34.2 % — ABNORMAL LOW (ref 36.0–46.0)
Hemoglobin: 12 g/dL (ref 12.0–15.0)
Immature Granulocytes: 1 %
Lymphocytes Relative: 13 %
Lymphs Abs: 0.9 10*3/uL (ref 0.7–4.0)
MCH: 31.3 pg (ref 26.0–34.0)
MCHC: 35.1 g/dL (ref 30.0–36.0)
MCV: 89.1 fL (ref 80.0–100.0)
Monocytes Absolute: 0.2 10*3/uL (ref 0.1–1.0)
Monocytes Relative: 3 %
Neutro Abs: 5.5 10*3/uL (ref 1.7–7.7)
Neutrophils Relative %: 83 %
Platelets: 216 10*3/uL (ref 150–400)
RBC: 3.84 MIL/uL — ABNORMAL LOW (ref 3.87–5.11)
RDW: 12.1 % (ref 11.5–15.5)
WBC: 6.6 10*3/uL (ref 4.0–10.5)
nRBC: 0 % (ref 0.0–0.2)

## 2024-04-01 LAB — LYME IGG/IGM
Lyme IgG EIA: POSITIVE
Lyme IgM EIA: POSITIVE

## 2024-04-01 LAB — TSH: TSH: 1.376 u[IU]/mL (ref 0.350–4.500)

## 2024-04-01 LAB — IRON AND TIBC
Iron: 106 ug/dL (ref 28–170)
Saturation Ratios: 42 % — ABNORMAL HIGH (ref 10.4–31.8)
TIBC: 252 ug/dL (ref 250–450)
UIBC: 146 ug/dL

## 2024-04-01 LAB — MAGNESIUM: Magnesium: 2.1 mg/dL (ref 1.7–2.4)

## 2024-04-01 LAB — PROCALCITONIN: Procalcitonin: 0.12 ng/mL

## 2024-04-01 LAB — RETICULOCYTES
Immature Retic Fract: 5.6 % (ref 2.3–15.9)
RBC.: 3.89 MIL/uL (ref 3.87–5.11)
Retic Count, Absolute: 22.2 10*3/uL (ref 19.0–186.0)
Retic Ct Pct: 0.6 % (ref 0.4–3.1)

## 2024-04-01 LAB — C-REACTIVE PROTEIN: CRP: 10.2 mg/dL — ABNORMAL HIGH (ref <1.0)

## 2024-04-01 LAB — LYME DISEASE SEROLOGY W/REFLEX: Lyme Total Antibody EIA: POSITIVE

## 2024-04-01 LAB — VITAMIN B12: Vitamin B-12: 3171 pg/mL — ABNORMAL HIGH (ref 180–914)

## 2024-04-01 LAB — FOLATE: Folate: 20.3 ng/mL (ref >5.9)

## 2024-04-01 LAB — FERRITIN: Ferritin: 213 ng/mL (ref 11–307)

## 2024-04-01 MED ORDER — DOXYCYCLINE HYCLATE 100 MG PO TABS: 100.0000 mg | ORAL_TABLET | Freq: Two times a day (BID) | ORAL | 0 refills | Status: DC

## 2024-04-01 MED ORDER — PREDNISONE 10 MG (21) PO TBPK: ORAL_TABLET | ORAL | 0 refills | Status: DC

## 2024-04-01 MED ORDER — CLOTRIMAZOLE 1 % EX CREA: TOPICAL_CREAM | Freq: Two times a day (BID) | CUTANEOUS | 0 refills | Status: DC

## 2024-04-01 MED ORDER — CETIRIZINE HCL 10 MG PO TABS: 10.0000 mg | ORAL_TABLET | Freq: Every day | ORAL | 0 refills | Status: DC

## 2024-04-01 MED ORDER — ACETAMINOPHEN 325 MG PO TABS: 650.0000 mg | ORAL_TABLET | Freq: Four times a day (QID) | ORAL | 0 refills | Status: DC | PRN

## 2024-04-01 MED ORDER — METHYLPREDNISOLONE SODIUM SUCC 125 MG IJ SOLR
125.0000 mg | Freq: Once | INTRAMUSCULAR | Status: AC
  Administered 2024-04-01: 125 mg via INTRAVENOUS
  Filled 2024-04-01: qty 2

## 2024-04-01 MED ORDER — DOXYCYCLINE HYCLATE 100 MG PO TABS: 100.0000 mg | ORAL_TABLET | Freq: Two times a day (BID) | ORAL | 0 refills | Status: AC

## 2024-04-01 MED ORDER — FAMOTIDINE 20 MG PO TABS: 20.0000 mg | ORAL_TABLET | Freq: Every day | ORAL | 0 refills | Status: DC

## 2024-04-01 NOTE — Discharge Summary (Addendum)
 Physician Discharge Summary   Patient: Gloria Cummings MRN: [id number] DOB: 06-02-1941  Admit date:     03/30/2024  Discharge date: 04/01/2024  Discharge Physician: Alejandro Marker, DO   PCP: Jodie Lavern LITTIE, MD   Recommendations at discharge:   Follow-up with PCP within 1 to 2 weeks repeat CBC, CMP, mag, Phos within 1 week Continue antibiotics for Lyme disease and have outpatient dermatological evaluation if not improving; Recommended to STOP STEROID taper, Cetirizine, and Famotidine   Discharge Diagnoses: Principal Problem:   Acute renal failure superimposed on stage 3b chronic kidney disease (HCC) Active Problems:   Labile essential hypertension   Right leg numbness   Elevated transaminase level   Hypokalemia   Intertrigo  Resolved Problems:   * No resolved hospital problems. [facility] Course: HPI per Dr. Charlton: Gloria Cummings is a 83 y.o. female with medical history significant for hypertension, osteoporosis, and breast cancer who presents with severe fatigue, right leg numbness, and groin rash.   Patient developed severe fatigue and loss of appetite on 03/25/2024.  She denies any abdominal pain, nausea, vomiting, or diarrhea associated with this.  There has not been any urinary symptoms.  She also reports numbness involving the anterior right thigh from the hip to the knee without any preceding trauma, back pain, or change in bowel and bladder habits.  Additionally, there has been a faint red rash under her waistband and in the bilateral groin without itching.   Patient reports that she is feeling much better after receiving IV fluids in the ED, has regained her appetite, and ate half an omelette prior to transfer to [facility].   **Interim History She has a rash and numbness in her leg.  Rash was getting worse and spread to her knee and starting to spread on her back and had improved with the administration of Solu-Medrol..  Ad[place]  cabin so may have been bitten by an insect.   She is back to her baseline and appetite is improved.  Labs are improved and she is stable for discharge at this time and will need follow-up and close monitoring and further evaluation in the outpatient setting with dermatology for possible biopsy this rash for further evaluation.  ** After the time of discharge and the patient leaving the hospital her Lyme titers came back and she is positive for Lyme disease.  I called the patient to inform her after her discharge as well as her daughter to inform her of the results.  Antibiotics with p.o. 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 Associated with Rash. Ddx include ?Femoral nerve neuropathy, meralgia paraesthetica, or L2-4 radiculopathy. Checked Lumbar-spine MRI and showed No acute osseous abnormality in the lumbar spine. Lower thoracic and lumbar spine degeneration but capacious underlying spinal canal. No subsequent spinal or convincing lateral recess stenosis. Moderate degenerative neural foraminal stenosis at the right L4 and right L5 nerve levels.  Follow-up in the outpatient setting if persistent will need further neurological evaluation if not  improved with Abx  Abnormal LFTs / Elevated transaminases, improving: RUQ U/S negative and Acute Hepatitis Panel negative. Checked CK given the AKI and was 57; IVF hydration as above. AST went from 94 -> 61 -> 37 and ALT went from 117 -> 79 -> 63. CTM and Trend and  repeat CMP in the AM  Subjective Fever / Fatigue/ Loss of Appetite / Poor Po Intake in the Setting of Lyme Disease: PT/OT to evaluate and Treat and recommended Follow up. Was Being worked up in the outpt setting. EBV VCA antibody panel was checked and has Abx against this.  Human parvo DNA detection by PSR was also checked.  Had some neutropenia earlier this month which had been since resolved.  Placed on Keflex in the outpatient setting which we will hold for now and stop at discharge.  ESR is elevated at 35 and CRP is elevated at 10.2.  Had some viral pharyngitis initially thought given that she had a sore throat but the sore throat is now resolved.  She does not have a lactic acidosis and was 0.8 x 2.  Will obtain nutritionist consultation if necessary.  She is improved and her appetite is back to her baseline.  Will need to follow-up with PCP within 1 to 2 weeks and repeat lab work at that time  Ear Ringing: Resolved.  Had a head CT which showed no acute intracranial abnormalities but did show some minor chronic small vessel ischemia.  Rash / Intertrigo: C/w Clotrimazole. Given Solumedrol 125 mg x2 while hospitalized and placed on a steroid taper but recommended to STOP Steroids and discussed with the patient's daughter and patient after discharge to not take the Steroid taper given.  -There wasConcern for Tick Borne ilness constellation of symptoms of fatigue, poor appetite, rash is worsening and given that visited [place] . Rash does not itch. ? Drug Reaction vs Fungal Infxn. Checked for Lyme and RMSF and will need to follow-up on these titers and Lyme came back POSITIVE after she was discharged. Unlikely Autoimmune given recent ANA being Negative. Will need outpatient evaluation by dermatology and consider biopsy if rash fails to improve. Called the patient in Doxycycline 100 mg BID x21 Days given Rash and Associated Radiculoneuritis. Recommending STOPPING STEROIDS altogether for now.    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 BUN/Cr Trend: Recent Labs  Lab 03/10/24 1528 03/30/24 1629 03/31/24 0017 04/01/24 0527  BUN 25* 38* 34* 24*  CREATININE 1.26* 1.76* 1.33* 1.06*  -C/w IVF Hydration w/ LR @ 75 mL/hr. Avoid Nephrotoxic Medications and hold Micardis , Contrast Dyes, Hypotension and Dehydration to Ensure Adequate Renal Perfusion and will need to Renally Adjust Meds. CTM and Trend Renal Function carefully and repeat CMP within 1 week  HypoNatremia: Na+ went from 131 -> 134 and is now 137.SABRA CTM and Trend and repeat CMP in the AM   Essential Hypertension: Hold Micardis  for now and resume at discharge, treat as-needed hospitalized   Hypokalemia:  K+ was 3.2 and is now 4.3. CTM and Trend and repeat CMP in the AM  Normocytic Anemia: Hgb/Hct is now 12.0/34.2. Checked Anemia Panel and showed an iron level of 106, UIBC 146, TIBC 252, saturation percent 42%, ferritin low 213, folate level 20.3 and vitamin B12 of 3171. CTM for S/Sx of Bleeding; No overt bleeding noted. Repeat CBC in within 1 week  Hypoalbuminemia: Patient's Albumin Level went from 4.9 -> 4.2 -> 3.0. CTM and  Trend and repeat CMP within 1 week  Consultants: None Procedures performed: As delineated as above  Disposition: Home Diet recommendation:  Discharge Diet Orders (From admission, onward)     Start     Ordered   04/01/24 0000  Diet - low sodium heart healthy        04/01/24 1318           Regular diet DISCHARGE MEDICATION: Allergies as of 04/01/2024       Reactions   Betadine [povidone Iodine] Swelling   Lubricants Dermatitis   Specifically water  based lubricants   Zestril [lisinopril] Cough        Medication List     STOP taking these medications    cephALEXin 250 MG capsule Commonly known as: KEFLEX       TAKE these medications    acetaminophen  325 MG  tablet Commonly known as: TYLENOL  Take 2 tablets (650 mg total) by mouth every 6 (six) hours as needed for mild pain (pain score 1-3) or fever (or Fever >/= 101).   aspirin  EC 81 MG tablet Take 81 mg by mouth daily.   calcium carbonate 750 MG chewable tablet Commonly known as: TUMS EX Chew 750 mg by mouth daily.   cetirizine 10 MG tablet Commonly known as: ZyrTEC Allergy Take 1 tablet (10 mg total) by mouth daily.   clotrimazole 1 % cream Commonly known as: LOTRIMIN Apply topically 2 (two) times daily.   famotidine 20 MG tablet Commonly known as: PEPCID Take 1 tablet (20 mg total) by mouth daily.   FISH OIL PO Take 4 capsules by mouth daily.   magnesium oxide 400 (240 Mg) MG tablet Commonly known as: MAG-OX Take 400 mg by mouth daily.   One A Day Women 50 Plus Tabs Take 1 tablet by mouth daily.   predniSONE 10 MG (21) Tbpk tablet Commonly known as: STERAPRED UNI-PAK 21 TAB Take 6 tablets daily for the first 2 days and then 5 tablets daily for the next 2 days, 4 tablets daily for the next following 2 days, then take 3 tablets daily for 2 days, then take 2 tablets daily for 2 days and then take 1 tablet daily for 2 days   RED YEAST RICE PO Take 1 capsule by mouth daily.   telmisartan -hydrochlorothiazide  80-12.5 MG tablet Commonly known as: MICARDIS  HCT Take 1 tablet by mouth daily.   Vitamin D3 50 MCG (2000 UT) capsule Take 2,000 Units by mouth daily.        Discharge Exam: Filed Weights   03/30/24 2004  Weight: 54.9 kg   Vitals:   04/01/24 0503 04/01/24 0852  BP: 127/67 (!) 159/63  Pulse: 69 85  Resp: 18 18  Temp: 97.8 F (36.6 C) 98 F (36.7 C)  SpO2: 99% 97%   Examination: Physical Exam:  Constitutional: Thin elderly Caucasian female in no acute distress Respiratory: Clear to auscultation bilaterally, no wheezing, rales, rhonchi or crackles. Normal respiratory effort and patient is not tachypenic. No accessory muscle use.  Unlabored  breathing Cardiovascular: RRR, no murmurs / rubs / gallops. S1 and S2 auscultated. No extremity edema Abdomen: Soft, non-tender, non-distended.Bowel sounds positive.  GU: Deferred. Musculoskeletal: No clubbing / cyanosis of digits/nails. No joint deformity upper and lower extremities.  Skin: Has a splotchy macular rash around her knee and in her groin area bilaterally as well as on her back. Neurologic: CN 2-12 grossly intact with no focal deficits.  Has diminished sensation on her right thigh.  Romberg sign  cerebellar reflexes not assessed.  Psychiatric: Normal judgment and insight. Alert and oriented x 3. Normal mood and appropriate affect.   Condition at discharge: stable  The results of significant diagnostics from this hospitalization (including imaging, microbiology, ancillary and laboratory) are listed below for reference.   Imaging Studies: MR LUMBAR SPINE WO CONTRAST Result Date: 03/31/2024 CLINICAL DATA:  83 year old female with Myelopathy. EXAM: MRI LUMBAR SPINE WITHOUT CONTRAST TECHNIQUE: Multiplanar, multisequence MR imaging of the lumbar spine was performed. No intravenous contrast was administered. COMPARISON:  CT Abdomen and Pelvis 06/13/2022. FINDINGS: Segmentation:  Normal on the comparison. Alignment: Stable chronic straightening of lumbar lordosis, mild underlying dextroconvex lumbar scoliosis. No significant spondylolisthesis. Vertebrae: Maintained vertebral body height. Normal background bone marrow signal. Chronic but progressed multilevel degenerative vertebral endplate Schmorl's nodes from T12 through the lumbar spine. Superimposed benign T12 vertebral hemangiomas (normal variant). Intact visible sacrum and SI joints. No marrow edema or evidence of acute osseous abnormality. Conus medullaris and cauda equina: Conus extends to the L1-L2 level. No lower spinal cord or conus signal abnormality. Capacious spinal canal and generally normal cauda equina nerve roots throughout.  Paraspinal and other soft tissues: Visible abdominal viscera appear stable from the prior CT Abdomen and Pelvis. Negative visualized posterior paraspinal soft tissues. Disc levels: Lower thoracic spine disc and endplate degeneration T9-T10 and T10-T11 without stenosis. T11-T12 and T12-L1 are normal for age. L1-L2: Small paracentral and caudal disc extrusion with annular fissure (series 2, image 10 and series 5, image 13). No stenosis. L2-L3: Mild disc space loss and circumferential disc bulge. Broad-based posterior component asymmetric to the right. Mild facet, mild to moderate ligament flavum hypertrophy. No spinal or lateral recess stenosis. Mild L2 foraminal stenosis. L3-L4: Disc space loss. Circumferential disc osteophyte complex. Mild to moderate posterior element hypertrophy. No spinal or lateral recess stenosis. Mild bilateral L3 foraminal stenosis. L4-L5: Similar disc space loss, circumferential disc osteophyte complex. Mild posterior element hypertrophy. No spinal or lateral recess stenosis. Moderate right L4 neural foraminal stenosis. L5-S1: Similar disc space loss, circumferential disc osteophyte complex. Mild facet, moderate ligament flavum hypertrophy. No spinal or lateral recess stenosis. Mild left and moderate right L5 foraminal stenosis. IMPRESSION: 1. No acute osseous abnormality in the lumbar spine. 2. Lower thoracic and lumbar spine degeneration but capacious underlying spinal canal. No subsequent spinal or convincing lateral recess stenosis. 3. Moderate degenerative neural foraminal stenosis at the right L4 and right L5 nerve levels. Electronically Signed   By: VEAR Hurst M.D.   On: 03/31/2024 08:44   US  Abdomen Complete Result Date: 03/31/2024 CLINICAL DATA:  Increased creatinine, fatigue, elevated LFTs EXAM: ABDOMEN ULTRASOUND COMPLETE COMPARISON:  CT abdomen pelvis 06/13/2022 FINDINGS: Gallbladder: No gallstones or wall thickening visualized. No sonographic Murphy sign noted by sonographer.  Common bile duct: Diameter: 3 mm. Liver: Benign cyst in the right hepatic lobe. Within normal limits in parenchymal echogenicity. Portal vein is patent on color Doppler imaging with normal direction of blood flow towards the liver. IVC: No abnormality visualized. Pancreas: Visualized portion unremarkable. Spleen: Size and appearance within normal limits. Right Kidney: Length: 10.1 cm. Echogenicity within normal limits. No mass or hydronephrosis visualized. Left Kidney: Length: 8.6 cm. Echogenicity within normal limits. No mass or hydronephrosis visualized. Abdominal aorta: No aneurysm visualized. Other findings: None. IMPRESSION: Unremarkable abdominal ultrasound. Electronically Signed   By: Norman Gatlin M.D.   On: 03/31/2024 02:04   CT Head Wo Contrast Result Date: 03/30/2024 CLINICAL DATA:  Headache, neuro deficit Weakness. EXAM: CT HEAD  WITHOUT CONTRAST TECHNIQUE: Contiguous axial images were obtained from the base of the skull through the vertex without intravenous contrast. RADIATION DOSE REDUCTION: This exam was performed according to the departmental dose-optimization program which includes automated exposure control, adjustment of the mA and/or kV according to patient size and/or use of iterative reconstruction technique. COMPARISON:  None Available. FINDINGS: Brain: No intracranial hemorrhage, mass effect, or midline shift. Normal brain volume for age. No hydrocephalus. The basilar cisterns are patent. Minor chronic small vessel ischemia. No evidence of territorial infarct or acute ischemia. No extra-axial or intracranial fluid collection. Vascular: Atherosclerosis of skullbase vasculature without hyperdense vessel or abnormal calcification. Skull: No fracture or focal lesion. Sinuses/Orbits: Subtotal opacification of right mastoid air cells. Paranasal sinuses are clear. Other: None. IMPRESSION: 1. No acute intracranial abnormality. 2. Minor chronic small vessel ischemia. 3. Subtotal opacification of  right mastoid air cells. Electronically Signed   By: Andrea Gasman M.D.   On: 03/30/2024 16:46   DG Chest 2 View Result Date: 03/30/2024 CLINICAL DATA:  weakness EXAM: CHEST - 2 VIEW COMPARISON:  02/06/2017 FINDINGS: Lungs are clear. Heart size and mediastinal contours are within normal limits. Aortic Atherosclerosis (ICD10-170.0). No effusion. Visualized bones unremarkable. IMPRESSION: No acute cardiopulmonary disease. Electronically Signed   By: JONETTA Faes M.D.   On: 03/30/2024 16:41   Microbiology: Results for orders placed or performed during the hospital encounter of 03/28/24  Urine Culture     Status: None   Collection Time: 03/28/24  4:21 PM   Specimen: Urine, Clean Catch  Result Value Ref Range Status   Specimen Description URINE, CLEAN CATCH  Final   Special Requests NONE  Final   Culture   Final    NO GROWTH Performed at [facility], [address], [place], [place] [address]    Report Status 03/30/2024 FINAL  Final   Labs: CBC: Recent Labs  Lab 03/30/24 1629 03/31/24 0017 04/01/24 0527  WBC 7.6 6.9 6.6  NEUTROABS 5.8  --  5.5  HGB 12.4 11.0* 12.0  HCT 34.8* 30.8* 34.2*  MCV 87.2 87.7 89.1  PLT 189 153 216   Basic Metabolic Panel: Recent Labs  Lab 03/30/24 1629 03/31/24 0017 03/31/24 1000 04/01/24 0527  NA 131* 134*  --  137  K 3.3* 3.2*  --  4.3  CL 90* 95*  --  104  CO2 28 25  --  25  GLUCOSE 93 103*  --  155*  BUN 38* 34*  --  24*  CREATININE 1.76* 1.33*  --  1.06*  CALCIUM 10.6* 9.2  --  9.0  MG  --  2.1  --  2.1  PHOS  --   --  2.9 2.6   Liver Function Tests: Recent Labs  Lab 03/30/24 1629 03/31/24 0017 04/01/24 0527  AST 94* 61* 37  ALT 117* 79* 63*  ALKPHOS 190* 122 117  BILITOT 0.6 0.7 0.6  PROT 7.5 5.8* 6.4*  ALBUMIN 4.2 3.0* 3.0*   CBG: No results for input(s): GLUCAP in the last 168 hours.  Discharge time spent: greater than 30 minutes.  Signed: Alejandro Marker, DO [facility] 04/01/2024

## 2024-04-01 NOTE — Progress Notes (Signed)
 Labs Came back positive for Lyme Disease after patient was discharged.   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   Will have her follow up with PCP but prescribe her a 14-21 Day Course of Doxycycline 100 mg BID given her associated rash and radiculoneuritis.

## 2024-04-01 NOTE — Plan of Care (Signed)
   Problem: Education: Goal: Knowledge of General Education information will improve Description: Including pain rating scale, medication(s)/side effects and non-pharmacologic comfort measures Outcome: Completed/Met

## 2024-04-01 NOTE — TOC Transition Note (Signed)
 Transition of Care [facility]) - Discharge Note   Patient Details  Name: Gloria Cummings MRN: [id number] Date of Birth: Feb 08, 1941  [facility] CM/SW Contact:  Tom-Johnson, Harvest Muskrat, RN Phone Number: 04/01/2024, 1:46 PM   Clinical Narrative:     Patient is scheduled for discharge today.  Readmission Risk Assessment done. Outpatient f/u, hospital f/u and discharge instructions on AVS. No TOC needs or recommendations noted. Daughter, Delon to transport at discharge.  No further TOC needs noted.        Final next level of care: Home/Self Care Barriers to Discharge: Barriers Resolved   Patient Goals and CMS Choice Patient states their goals for this hospitalization and ongoing recovery are:: To return home CMS Medicare.gov Compare Post Acute Care list provided to:: Patient Choice offered to / list presented to : NA      Discharge Placement                Patient to be transferred to facility by: Daughter Name of family member notified: [facility]    Discharge Plan and Services Additional resources added to the After Visit Summary for                  DME Arranged: N/A DME Agency: NA       HH Arranged: NA HH Agency: NA        Social Drivers of Health (SDOH) Interventions SDOH Screenings   Food Insecurity: No Food Insecurity (03/30/2024)  Housing: Low Risk  (03/30/2024)  Transportation Needs: No Transportation Needs (03/30/2024)  Utilities: Not At Risk (03/30/2024)  Depression (PHQ2-9): Low Risk  (03/21/2024)  Financial Resource Strain: Low Risk  (09/03/2023)  Physical Activity: Sufficiently Active (09/03/2023)  Social Connections: Socially Isolated (03/30/2024)  Stress: No Stress Concern Present (09/03/2023)  Tobacco Use: Low Risk  (03/30/2024)  Health Literacy: Adequate Health Literacy (09/03/2023)     Readmission Risk Interventions    04/01/2024    1:45 PM 03/31/2024    3:33 PM  Readmission Risk Prevention Plan  Post Dischage Appt  Complete   Medication Screening Complete   Transportation Screening Complete Complete  PCP or Specialist Appt within 5-7 Days  Complete  Home Care Screening  Complete  Medication Review (RN CM)  Referral to Pharmacy

## 2024-04-01 NOTE — Progress Notes (Signed)
 PT Cancellation Discharge Note  Patient Details Name: ZAHRAA BHARGAVA MRN: [id number] DOB: 1941/05/10   Cancelled Treatment:    Reason Eval/Treat Not Completed: PT screened, no needs identified, will sign off  Discussed pt status with RN;   Silvano Currier, PT  Acute Rehabilitation Services Office [phone] Secure Chat welcomed    Silvano VEAR Currier 04/01/2024, 11:56 AM

## 2024-04-02 ENCOUNTER — Telehealth: Payer: Self-pay

## 2024-04-02 DIAGNOSIS — Z85828 Personal history of other malignant neoplasm of skin: Secondary | ICD-10-CM | POA: Diagnosis not present

## 2024-04-02 DIAGNOSIS — R21 Rash and other nonspecific skin eruption: Secondary | ICD-10-CM | POA: Diagnosis not present

## 2024-04-02 LAB — LYME DISEASE, WESTERN BLOT
IgG P18 Ab.: ABSENT
IgG P23 Ab.: ABSENT
IgG P28 Ab.: ABSENT
IgG P30 Ab.: ABSENT
IgG P39 Ab.: ABSENT
IgG P41 Ab.: ABSENT
IgG P45 Ab.: ABSENT
IgG P66 Ab.: ABSENT
IgG P93 Ab.: ABSENT
IgM P39 Ab.: ABSENT
Lyme IgG Wb: NEGATIVE
Lyme IgM Wb: POSITIVE — AB

## 2024-04-02 NOTE — Transitions of Care (Post Inpatient/ED Visit) (Unsigned)
   04/02/2024  Name: Gloria Cummings MRN: [id number] DOB: 1941/06/16  Today's TOC FU Call Status: Today's TOC FU Call Status:: Unsuccessful Call (1st Attempt) Unsuccessful Call (1st Attempt) Date: 04/02/24  Attempted to reach the patient regarding the most recent Inpatient/ED visit.  Follow Up Plan: Additional outreach attempts will be made to reach the patient to complete the Transitions of Care (Post Inpatient/ED visit) call.   Signature Julian Lemmings, LPN [facility] Nurse Health Advisor Direct Dial [phone]

## 2024-04-03 ENCOUNTER — Ambulatory Visit (INDEPENDENT_AMBULATORY_CARE_PROVIDER_SITE_OTHER): Admitting: Family Medicine

## 2024-04-03 ENCOUNTER — Encounter: Payer: Self-pay | Admitting: Family Medicine

## 2024-04-03 VITALS — BP 120/80 | HR 58 | Temp 97.6°F | Ht 65.0 in | Wt 122.0 lb

## 2024-04-03 DIAGNOSIS — R200 Anesthesia of skin: Secondary | ICD-10-CM | POA: Diagnosis not present

## 2024-04-03 DIAGNOSIS — R0989 Other specified symptoms and signs involving the circulatory and respiratory systems: Secondary | ICD-10-CM | POA: Diagnosis not present

## 2024-04-03 DIAGNOSIS — R410 Disorientation, unspecified: Secondary | ICD-10-CM

## 2024-04-03 DIAGNOSIS — R768 Other specified abnormal immunological findings in serum: Secondary | ICD-10-CM

## 2024-04-03 LAB — SPOTTED FEVER GROUP ANTIBODIES
Spotted Fever Group IgG: <1:64 {titer}
Spotted Fever Group IgM: <1:64 {titer}

## 2024-04-03 NOTE — Transitions of Care (Post Inpatient/ED Visit) (Signed)
   04/03/2024  Name: Gloria Cummings MRN: [id number] DOB: Apr 04, 1941  Today's TOC FU Call Status: Today's TOC FU Call Status:: Unsuccessful Call (1st Attempt) Unsuccessful Call (1st Attempt) Date: 04/02/24  Attempted to reach the patient regarding the most recent Inpatient/ED visit.  Follow Up Plan: No further outreach attempts will be made at this time. We have been unable to contact the patient. Patient already seen in office Signature Julian Lemmings, LPN [facility] Nurse Health Advisor Direct Dial [phone]

## 2024-04-03 NOTE — Progress Notes (Signed)
 Subjective  CC:  Chief Complaint  Patient presents with   Hospitalization Follow-up    HPI: Gloria Cummings is a 83 y.o. female who presents to the office today to address the problems listed above in the chief complaint. Discussed the use of AI scribe software for clinical note transcription with the patient, who gave verbal consent to proceed.  History of Present Illness Gloria Cummings is an 83 year old female who presents with worsening symptoms potentially related to Lyme disease.  She initially experienced fever and intermittent chills with temperatures ranging from 100.55F to 100.65F, extreme fatigue, loss of appetite, and night sweats. These symptoms began on Tuesday, with significant fatigue noted on Wednesday. She was taken to urgent care on Friday, where a urinary tract infection was diagnosed.  She has respiratory issues, including congestion that she is unable to clear and a persistent cough. She describes feeling 'total lethargy' and has been markedly more fatigued since yesterday. She attempted to be active this morning but felt collapsed afterward.  She has a rash that started higher on her body and moved down to her groin and knee. The rash was initially red and flat, not a bull's eye, and improved with steroids. She experiences numbness in her thigh and knee, and a spot on her back, which affects her mobility. The rash is not itchy or burning and resembles a heat rash.  She has experienced cognitive changes, including confusion and difficulty keeping track of time and tasks. Her daughter notes that she has been forgetting recent events and instructions, which is a change from her baseline.  She has been on doxycycline and was previously on steroids, which improved her rash. No joint pain. No nausea last week, though she felt a little nausea this morning after taking doxycycline. She has not experienced hoarseness before today. No significant pain, only numbness in her  leg.  I reviewed hospital records: had sxs as documented above; evaluated at urgent care, dxd with possible UTI; but then worsened with confusion, fever and fatigue and rash: hospitalized due to mental status changes, elevated CRP at 10, and unclear etiology of sxs/rash and right anterior leg numbness. Lumbar MRI was neg, brain CT unremarkable, abdominal ultrasound unremarkable and labs below: eventually convinced to order lyme serology (had been traveling to [place]). Serology and western blot returned positive after discharge. Pt had improved while in hospital with ivf and prednisone. Doxy was started yesterday. Now feeling very lethargic again and hoarse. Appetite is fair.   No visits with results within 1 Day(s) from this visit.  Latest known visit with results is:  Admission on 03/30/2024, Discharged on 04/01/2024  Component Date Value Ref Range Status   Sodium 03/30/2024 131 (L)  135 - 145 mmol/L Final   Potassium 03/30/2024 3.3 (L)  3.5 - 5.1 mmol/L Final   Chloride 03/30/2024 90 (L)  98 - 111 mmol/L Final   CO2 03/30/2024 28  22 - 32 mmol/L Final   Glucose, Bld 03/30/2024 93  70 - 99 mg/dL Final   BUN 93/70/7974 38 (H)  8 - 23 mg/dL Final   Creatinine, Ser 03/30/2024 1.76 (H)  0.44 - 1.00 mg/dL Final   Calcium 93/70/7974 10.6 (H)  8.9 - 10.3 mg/dL Final   Total Protein 93/70/7974 7.5  6.5 - 8.1 g/dL Final   Albumin 93/70/7974 4.2  3.5 - 5.0 g/dL Final   AST 93/70/7974 94 (H)  15 - 41 U/L Final   ALT 03/30/2024 117 (H)  0 - 44 U/L Final   Alkaline Phosphatase 03/30/2024 190 (H)  38 - 126 U/L Final   Total Bilirubin 03/30/2024 0.6  0.0 - 1.2 mg/dL Final   GFR, Estimated 03/30/2024 28 (L)  >60 mL/min Final   Anion gap 03/30/2024 13  5 - 15 Final   WBC 03/30/2024 7.6  4.0 - 10.5 K/uL Final   RBC 03/30/2024 3.99  3.87 - 5.11 MIL/uL Final   Hemoglobin 03/30/2024 12.4  12.0 - 15.0 g/dL Final   HCT 93/70/7974 34.8 (L)  36.0 - 46.0 % Final   MCV 03/30/2024 87.2  80.0 - 100.0 fL Final    MCH 03/30/2024 31.1  26.0 - 34.0 pg Final   MCHC 03/30/2024 35.6  30.0 - 36.0 g/dL Final   RDW 93/70/7974 12.2  11.5 - 15.5 % Final   Platelets 03/30/2024 189  150 - 400 K/uL Final   nRBC 03/30/2024 0.0  0.0 - 0.2 % Final   Neutrophils Relative % 03/30/2024 76  % Final   Neutro Abs 03/30/2024 5.8  1.7 - 7.7 K/uL Final   Lymphocytes Relative 03/30/2024 17  % Final   Lymphs Abs 03/30/2024 1.3  0.7 - 4.0 K/uL Final   Monocytes Relative 03/30/2024 6  % Final   Monocytes Absolute 03/30/2024 0.4  0.1 - 1.0 K/uL Final   Eosinophils Relative 03/30/2024 1  % Final   Eosinophils Absolute 03/30/2024 0.1  0.0 - 0.5 K/uL Final   Basophils Relative 03/30/2024 0  % Final   Basophils Absolute 03/30/2024 0.0  0.0 - 0.1 K/uL Final   Immature Granulocytes 03/30/2024 0  % Final   Abs Immature Granulocytes 03/30/2024 0.02  0.00 - 0.07 K/uL Final   Total CK 03/31/2024 57  38 - 234 U/L Final   Hepatitis B Surface Ag 03/31/2024 NON REACTIVE  NON REACTIVE Final   HCV Ab 03/31/2024 NON REACTIVE  NON REACTIVE Final   Hep A IgM 03/31/2024 NON REACTIVE  NON REACTIVE Final   Hep B C IgM 03/31/2024 NON REACTIVE  NON REACTIVE Final   Sodium 03/31/2024 134 (L)  135 - 145 mmol/L Final   Potassium 03/31/2024 3.2 (L)  3.5 - 5.1 mmol/L Final   Chloride 03/31/2024 95 (L)  98 - 111 mmol/L Final   CO2 03/31/2024 25  22 - 32 mmol/L Final   Glucose, Bld 03/31/2024 103 (H)  70 - 99 mg/dL Final   BUN 93/69/7974 34 (H)  8 - 23 mg/dL Final   Creatinine, Ser 03/31/2024 1.33 (H)  0.44 - 1.00 mg/dL Final   Calcium 93/69/7974 9.2  8.9 - 10.3 mg/dL Final   Total Protein 93/69/7974 5.8 (L)  6.5 - 8.1 g/dL Final   Albumin 93/69/7974 3.0 (L)  3.5 - 5.0 g/dL Final   AST 93/69/7974 61 (H)  15 - 41 U/L Final   ALT 03/31/2024 79 (H)  0 - 44 U/L Final   Alkaline Phosphatase 03/31/2024 122  38 - 126 U/L Final   Total Bilirubin 03/31/2024 0.7  0.0 - 1.2 mg/dL Final   GFR, Estimated 03/31/2024 39 (L)  >60 mL/min Final   Anion gap 03/31/2024  14  5 - 15 Final   Magnesium 03/31/2024 2.1  1.7 - 2.4 mg/dL Final   WBC 93/69/7974 6.9  4.0 - 10.5 K/uL Final   RBC 03/31/2024 3.51 (L)  3.87 - 5.11 MIL/uL Final   Hemoglobin 03/31/2024 11.0 (L)  12.0 - 15.0 g/dL Final   HCT 93/69/7974 30.8 (L)  36.0 -  46.0 % Final   MCV 03/31/2024 87.7  80.0 - 100.0 fL Final   MCH 03/31/2024 31.3  26.0 - 34.0 pg Final   MCHC 03/31/2024 35.7  30.0 - 36.0 g/dL Final   RDW 93/69/7974 12.2  11.5 - 15.5 % Final   Platelets 03/31/2024 153  150 - 400 K/uL Final   nRBC 03/31/2024 0.0  0.0 - 0.2 % Final   Phosphorus 03/31/2024 2.9  2.5 - 4.6 mg/dL Final   Lyme Total Antibody EIA 03/31/2024 Positive  Negative Final   Spotted Fever Group IgG 03/31/2024 <1:64  Neg:<1:64 Final   Spotted Fever Group IgM 03/31/2024 <1:64  Neg:<1:64 Final   Result Comment 03/31/2024 Comment   Final   Sed Rate 03/31/2024 35 (H)  0 - 22 mm/hr Final   Lactic Acid, Venous 03/31/2024 0.8  0.5 - 1.9 mmol/L Final   Lactic Acid, Venous 03/31/2024 0.8  0.5 - 1.9 mmol/L Final     IgG P93 Ab. 03/31/2024 Absent   Final     IgG P66 Ab. 03/31/2024 Absent   Final     IgG P58 Ab. 03/31/2024 Present   Final     IgG P45 Ab. 03/31/2024 Absent   Final     IgG P41 Ab. 03/31/2024 Absent   Final     IgG P39 Ab. 03/31/2024 Absent   Final     IgG P30 Ab. 03/31/2024 Absent   Final     IgG P28 Ab. 03/31/2024 Absent   Final     IgG P23 Ab. 03/31/2024 Absent   Final     IgG P18 Ab. 03/31/2024 Absent   Final   Lyme IgG Wb 03/31/2024 Negative  Negative Final     IgM P41 Ab. 03/31/2024 Present   Final     IgM P39 Ab. 03/31/2024 Absent   Final     IgM P23 Ab. 03/31/2024 Present   Final   Lyme IgM Wb 03/31/2024 Positive (A)  Negative Final   CRP 04/01/2024 10.2 (H)  <1.0 mg/dL Final   WBC 92/98/7974 6.6  4.0 - 10.5 K/uL Final   RBC 04/01/2024 3.84 (L)  3.87 - 5.11 MIL/uL Final   Hemoglobin 04/01/2024 12.0  12.0 - 15.0 g/dL Final   HCT 92/98/7974 34.2 (L)  36.0 - 46.0 % Final   MCV 04/01/2024 89.1  80.0 -  100.0 fL Final   MCH 04/01/2024 31.3  26.0 - 34.0 pg Final   MCHC 04/01/2024 35.1  30.0 - 36.0 g/dL Final   RDW 92/98/7974 12.1  11.5 - 15.5 % Final   Platelets 04/01/2024 216  150 - 400 K/uL Final   nRBC 04/01/2024 0.0  0.0 - 0.2 % Final   Neutrophils Relative % 04/01/2024 83  % Final   Neutro Abs 04/01/2024 5.5  1.7 - 7.7 K/uL Final   Lymphocytes Relative 04/01/2024 13  % Final   Lymphs Abs 04/01/2024 0.9  0.7 - 4.0 K/uL Final   Monocytes Relative 04/01/2024 3  % Final   Monocytes Absolute 04/01/2024 0.2  0.1 - 1.0 K/uL Final   Eosinophils Relative 04/01/2024 0  % Final   Eosinophils Absolute 04/01/2024 0.0  0.0 - 0.5 K/uL Final   Basophils Relative 04/01/2024 0  % Final   Basophils Absolute 04/01/2024 0.0  0.0 - 0.1 K/uL Final   Immature Granulocytes 04/01/2024 1  % Final   Abs Immature Granulocytes 04/01/2024 0.04  0.00 - 0.07 K/uL Final   Sodium 04/01/2024 137  135 - 145 mmol/L Final  Potassium 04/01/2024 4.3  3.5 - 5.1 mmol/L Final   Chloride 04/01/2024 104  98 - 111 mmol/L Final   CO2 04/01/2024 25  22 - 32 mmol/L Final   Glucose, Bld 04/01/2024 155 (H)  70 - 99 mg/dL Final   BUN 92/98/7974 24 (H)  8 - 23 mg/dL Final   Creatinine, Ser 04/01/2024 1.06 (H)  0.44 - 1.00 mg/dL Final   Calcium 92/98/7974 9.0  8.9 - 10.3 mg/dL Final   Total Protein 92/98/7974 6.4 (L)  6.5 - 8.1 g/dL Final   Albumin 92/98/7974 3.0 (L)  3.5 - 5.0 g/dL Final   AST 92/98/7974 37  15 - 41 U/L Final   ALT 04/01/2024 63 (H)  0 - 44 U/L Final   Alkaline Phosphatase 04/01/2024 117  38 - 126 U/L Final   Total Bilirubin 04/01/2024 0.6  0.0 - 1.2 mg/dL Final   GFR, Estimated 04/01/2024 52 (L)  >60 mL/min Final   Anion gap 04/01/2024 8  5 - 15 Final   Magnesium 04/01/2024 2.1  1.7 - 2.4 mg/dL Final   Phosphorus 92/98/7974 2.6  2.5 - 4.6 mg/dL Final   Iron 92/98/7974 106  28 - 170 ug/dL Final   TIBC 92/98/7974 252  250 - 450 ug/dL Final   Saturation Ratios 04/01/2024 42 (H)  10.4 - 31.8 % Final   UIBC  04/01/2024 146  ug/dL Final   Ferritin 92/98/7974 213  11 - 307 ng/mL Final   Retic Ct Pct 04/01/2024 0.6  0.4 - 3.1 % Final   RBC. 04/01/2024 3.89  3.87 - 5.11 MIL/uL Final   Retic Count, Absolute 04/01/2024 22.2  19.0 - 186.0 K/uL Final   Immature Retic Fract 04/01/2024 5.6  2.3 - 15.9 % Final   Procalcitonin 04/01/2024 0.12  ng/mL Final   Vitamin B-12 04/01/2024 3,171 (H)  180 - 914 pg/mL Final   Folate 04/01/2024 20.3  >5.9 ng/mL Final   TSH 04/01/2024 1.376  0.350 - 4.500 uIU/mL Final   Lyme IgG EIA 03/31/2024 Positive  Negative Final   Lyme IgM EIA 03/31/2024 Positive  Negative Final   Lyme Interpretation 03/31/2024 Comment (A)   Final     Assessment  1. Positive Lyme disease serology      Plan  Assessment and Plan Assessment & Plan Suspected Lyme Disease Symptoms suggestive of Lyme disease with positive IgM antibodies. Diagnosis complicated by potential false positives. On doxycycline, appropriate for Lyme disease. Awaiting infectious disease consultation.  - Continue doxycycline treatment. - Refer to infectious disease specialist for further evaluation. - Monitor symptoms and ensure adequate hydration and nutrition.  Respiratory Congestion New onset respiratory congestion, possibly related to suspected Lyme disease or another condition. Noted increased fatigue after exertion. - Ensure adequate hydration.  Cognitive Changes Cognitive changes more pronounced than typical age-related issues, associated with suspected Lyme disease. Episodes of confusion noted. - Monitor cognitive function.  Numbness in Left Leg Numbness in left leg affecting mobility, associated with suspected Lyme disease. Possible neurological involvement. - Monitor for changes in mobility and sensation.  Htn: untreated now since had been running low. Will monitor. Did not restart meds today.   Follow up: 1 mo for recheck and f/u bp Orders Placed This Encounter  Procedures   Ambulatory referral to  Infectious Disease   No orders of the defined types were placed in this encounter.    I reviewed the patients updated PMH, FH, and SocHx.  Patient Active Problem List   Diagnosis Date Noted  Chronic kidney disease, stage 3a (HCC) 08/16/2022    Priority: High   History of breast cancer 03/12/2017    Priority: High   History of endometrial cancer 02/02/2017    Priority: High   Labile essential hypertension 01/20/2011    Priority: High   Family history of breast cancer     Priority: Medium    Family history of prostate cancer     Priority: Medium    Family history of colon cancer     Priority: Medium    Colon polyp 01/14/2013    Priority: Medium    Recurrent UTI 01/14/2013    Priority: Medium    Osteoarthritis, multiple sites 10/25/2010    Priority: Medium    Osteoporosis 10/25/2010    Priority: Medium    Genetic testing 04/12/2017    Priority: Low   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   aspirin  EC 81 MG tablet Take 81 mg by mouth daily.   calcium carbonate (TUMS EX) 750 MG chewable tablet Chew 750 mg by mouth daily.   cetirizine (ZYRTEC ALLERGY) 10 MG tablet Take 1 tablet (10 mg total) by mouth daily.   Cholecalciferol (VITAMIN D3) 50 MCG (2000 UT) capsule Take 2,000 Units by mouth daily.   clotrimazole (LOTRIMIN) 1 % cream Apply topically 2 (two) times daily.   doxycycline (VIBRA-TABS) 100 MG tablet Take 1 tablet (100 mg total)  by mouth 2 (two) times daily for 21 days.   famotidine (PEPCID) 20 MG tablet Take 1 tablet (20 mg total) by mouth daily.   magnesium oxide (MAG-OX) 400 (240 Mg) MG tablet Take 400 mg by mouth daily.   Multiple Vitamins-Minerals (ONE A DAY WOMEN 50 PLUS) TABS Take 1 tablet by mouth daily.   Omega-3 Fatty Acids (FISH OIL PO) Take 4 capsules by mouth daily.   predniSONE (STERAPRED UNI-PAK 21 TAB) 10 MG (21) TBPK tablet Take 6 tablets daily for the first 2 days and then 5 tablets daily for the next 2 days, 4 tablets daily for the next following 2 days, then take 3 tablets daily for 2 days, then take 2 tablets daily for 2 days and then take 1 tablet daily for 2 days   Red Yeast Rice Extract (RED YEAST RICE PO) Take 1 capsule by mouth daily.   telmisartan -hydrochlorothiazide  (MICARDIS  HCT) 80-12.5 MG tablet Take 1 tablet by mouth daily.   Allergies: Patient is allergic to betadine [povidone iodine], lubricants, and zestril [lisinopril]. Family History: Patient family history includes Breast cancer in her cousin and paternal aunt; Cancer in her paternal aunt and paternal grandfather; Heart disease in her brother, father, maternal grandfather, and maternal grandmother; Prostate cancer in her paternal uncle; Stomach cancer in her paternal uncle; Stroke in her father and mother. Social History:  Patient  reports that she has never smoked. She has never used smokeless tobacco. She reports that she does not drink alcohol and does not use drugs.  Review  of Systems: Constitutional: Negative for fever malaise or anorexia Cardiovascular: negative for chest pain Respiratory: negative for SOB or persistent cough Gastrointestinal: negative for abdominal pain  Objective  Vitals: BP 120/80   Pulse (!) 58   Temp 97.6 F (36.4 C) (Oral)   Ht 5' 5 (1.651 m)   Wt 122 lb (55.3 kg)   SpO2 97%   BMI 20.30 kg/m  General: no acute distress , A&Ox3, hoarse, no respiratory distress HEENT: PEERL, conjunctiva  normal, neck is supple Cardiovascular:  RRR without murmur or gallop.  Respiratory:  Good breath sounds bilaterally, CTAB with normal respiratory effort Skin:  Warm, no rashes Commons side effects, risks, benefits, and alternatives for medications and treatment plan prescribed today were discussed, and the patient expressed understanding of the given instructions. Patient is instructed to call or message via MyChart if he/she has any questions or concerns regarding our treatment plan. No barriers to understanding were identified. We discussed Red Flag symptoms and signs in detail. Patient expressed understanding regarding what to do in case of urgent or emergency type symptoms.  Medication list was reconciled, printed and provided to the patient in AVS. Patient instructions and summary information was reviewed with the patient as documented in the AVS. This note was prepared with assistance of Dragon voice recognition software. Occasional wrong-word or sound-a-like substitutions may have occurred due to the inherent limitations of voice recognition software

## 2024-04-15 ENCOUNTER — Ambulatory Visit: Admitting: Infectious Disease

## 2024-04-17 ENCOUNTER — Ambulatory Visit: Admitting: Infectious Diseases

## 2024-04-17 ENCOUNTER — Other Ambulatory Visit: Payer: Self-pay

## 2024-04-17 ENCOUNTER — Ambulatory Visit: Payer: Self-pay

## 2024-04-17 VITALS — BP 174/76 | HR 73 | Temp 98.6°F | Wt 121.0 lb

## 2024-04-17 DIAGNOSIS — A6920 Lyme disease, unspecified: Secondary | ICD-10-CM | POA: Diagnosis not present

## 2024-04-17 DIAGNOSIS — R5383 Other fatigue: Secondary | ICD-10-CM

## 2024-04-17 DIAGNOSIS — Z79899 Other long term (current) drug therapy: Secondary | ICD-10-CM | POA: Diagnosis not present

## 2024-04-17 DIAGNOSIS — R200 Anesthesia of skin: Secondary | ICD-10-CM

## 2024-04-17 DIAGNOSIS — I10 Essential (primary) hypertension: Secondary | ICD-10-CM

## 2024-04-17 NOTE — Progress Notes (Signed)
 Patient Active Problem List   Diagnosis Date Noted   Acute renal failure superimposed on stage 3b chronic kidney disease (HCC) 03/30/2024   Right leg numbness 03/30/2024   Elevated transaminase level 03/30/2024   Hypokalemia 03/30/2024   Intertrigo 03/30/2024   Bilateral hearing loss 03/10/2024   Orthostatic hypotension 03/10/2024   Concern about memory 03/10/2024   Chronic kidney disease, stage 3a (HCC) 08/16/2022   Genetic testing 04/12/2017   History of breast cancer 03/12/2017   Family history of breast cancer    Family history of prostate cancer    Family history of colon cancer    History of endometrial cancer 02/02/2017   Colon polyp 01/14/2013   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   ASPIRIN  EC 81 MG TABLET    Take 81 mg by mouth daily.   CALCIUM CARBONATE (TUMS EX) 750 MG CHEWABLE TABLET    Chew 750 mg by mouth daily.   CETIRIZINE  (ZYRTEC  ALLERGY) 10 MG TABLET    Take 1 tablet (10 mg total) by mouth daily.   CHOLECALCIFEROL (VITAMIN D3) 50 MCG (2000 UT) CAPSULE    Take 2,000 Units by mouth daily.   CLOTRIMAZOLE  (LOTRIMIN ) 1 % CREAM    Apply topically 2 (two) times daily.   DOXYCYCLINE  (VIBRA -TABS) 100 MG TABLET    Take 1 tablet (100 mg total) by mouth 2 (two) times daily for 21 days.   FAMOTIDINE  (PEPCID ) 20 MG TABLET    Take 1 tablet (20 mg total) by mouth daily.   MAGNESIUM OXIDE (MAG-OX) 400 (240 MG) MG TABLET    Take 400 mg by mouth daily.   MULTIPLE VITAMINS-MINERALS (ONE A DAY WOMEN 50 PLUS) TABS    Take 1 tablet by mouth daily.   OMEGA-3 FATTY  ACIDS (FISH OIL PO)    Take 4 capsules by mouth daily.   PREDNISONE  (STERAPRED UNI-PAK 21 TAB) 10 MG (21) TBPK TABLET    Take 6 tablets daily for the first 2 days and then 5 tablets daily for the next 2 days, 4 tablets daily for the next following 2 days, then take 3 tablets daily for 2 days, then take 2 tablets daily for 2 days and then take 1 tablet daily for 2 days   RED YEAST RICE EXTRACT (RED YEAST RICE PO)    Take 1 capsule by mouth daily.   TELMISARTAN -HYDROCHLOROTHIAZIDE  (MICARDIS  HCT) 80-12.5 MG TABLET    Take 1 tablet by mouth daily.  Modified Medications   No medications on file  Discontinued Medications   No medications on file    Subjective: Discussed the use of AI scribe software for clinical note transcription with the patient, who gave verbal consent to proceed.   83 Y O Female with prior h/o osteoporosis, arthritis, HTN, breast/endometrial ca  who is referred in the context of recent hospital admission for Lyme disease. She presented to the ED with severe fatigue, numbness in her rt thigh and rash involving her lower abdomen, b/l groins including rt leg up to knee and back. They report 2 weeks  prior [place] where presumed to have a tick bite.   Work up in the hospital included positive EBV VCA IgG and EBV NA IgG, positive lyme serology IgM and IgG  Negative SARScov2, negative acute hepatitis panel, negative RMSF serology   CT head, CXR, US  abdomen and MRI L spine negative for any acute pathology. EKG with PR 230 ms ( asymptomatic 1st degree AV block)  Patient is accompanied by her daughter. Symptoms began on June 24th with extreme fatigue, chills, fever, and night sweats. Severe headaches occurred, described as 'a dart being thrown into my head,' with night sweats soaking her nightgown twice. She experienced anorexia and an aversion to sweet tastes. She was seen at the urgent care initially and presumed to have UTI which was later ruled out.  She was then seen at the ED where noted to have rash, warm to the touch, extending from the groin to the knee, accompanied by numbness in the right leg.  She reports rash has resolved, but numbness in the right leg persists, with occasional stinging pain. Still has significant fatigue, dyspnea with minimal exertion, and changes in bowel habits, including watery stools and burping, are present. She notes a change in her voice and short-term memory loss, necessitating note-taking.  She reports medications for HTN were held  due to hypotension in the hospital. Blood pressure has since increased and has been following up with PCP for that.  Previously very active, she now lacks energy, spending most of her time in bed or on the couch, and desires to return to her prior activity level. 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   Review of Systems: all systems reviewed with pertinent positives and negatives as listed above   Past Medical History:  Diagnosis Date   Anemia    Arthritis    Cancer (HCC)    endo metrial   left breast   Cataract    Dyspnea    hx of   Family history of adverse reaction to anesthesia    Mother died following surgery stroke   Family history of breast cancer    Family history of colon cancer    Family history of prostate cancer    Family history of stomach cancer    Heart murmur    History of colon polyps    Hypertension    Occipital neuralgia    Osteoporosis    Pneumonia    Past Surgical History:  Procedure Laterality Date   BREAST LUMPECTOMY     left lumpectomy   COLON SURGERY     procedures   EYE SURGERY     implants on both eyes   FOOT SURGERY     FRACTURE SURGERY     ROBOTIC ASSISTED LAP VAGINAL HYSTERECTOMY N/A 02/08/2017   Procedure: XI ROBOTIC ASSISTED LAPAROSCOPIC TOTAL HYSTERECTOMY, BILATERAL SALPINGOOPHORECTOMY;  Surgeon: Eloy Herring, MD;  Location: [facility];  Service: Gynecology;  Laterality: N/A;   SENTINEL NODE  BIOPSY N/A 02/08/2017   Procedure: SENTINEL NODE BIOPSY;  Surgeon: Eloy Herring, MD;  Location: [facility];  Service: Gynecology;  Laterality: N/A;   TUBAL LIGATION     WISDOM TOOTH EXTRACTION    ' Social History   Tobacco Use   Smoking status: Never   Smokeless tobacco: Never  Vaping Use   Vaping status: Never Used  Substance Use Topics   Alcohol use: No   Drug  use: No    Family History  Problem Relation Age of Onset   Stroke Mother    Heart disease Father    Stroke Father    Heart disease Brother    Heart disease Maternal Grandmother    Heart disease Maternal Grandfather    Cancer Paternal Grandfather        NOS   Breast cancer Paternal Aunt        dx over 23   Stomach cancer Paternal Uncle    Prostate cancer Paternal Uncle    Cancer Paternal Aunt        possible colon cancer   Breast cancer Cousin        paternal first cousin dx over 17    Allergies  Allergen Reactions   Betadine [Povidone Iodine] Swelling   Lubricants Dermatitis    Specifically water  based lubricants   Zestril [Lisinopril] Cough    Health Maintenance  Topic Date Due   COVID-19 Vaccine (3 - 2024-25 season) 06/03/2023   INFLUENZA VACCINE  05/02/2024   Medicare Annual Wellness (AWV)  09/02/2024   MAMMOGRAM  02/26/2025   DTaP/Tdap/Td (3 - Td or Tdap) 01/30/2026   Pneumococcal Vaccine: 50+ Years  Completed   Zoster Vaccines- Shingrix   Completed   Hepatitis B Vaccines  Aged Out   HPV VACCINES  Aged Out   Meningococcal B Vaccine  Aged Out   DEXA SCAN  Discontinued    Objective:  Vitals:   04/17/24 1353  BP: (!) 174/76  Pulse: 73  Temp: 98.6 F (37 C)  TempSrc: Oral  SpO2: 95%  Weight: 121 lb (54.9 kg)   Body mass index is 20.14 kg/m.  Physical Exam Constitutional:      Appearance: Normal appearance.  HENT:     Head: Normocephalic and atraumatic.      Mouth: Mucous membranes are moist.  Eyes:    Conjunctiva/sclera: Conjunctivae normal.     Pupils: Pupils are equal, round, and  b/l symmetrical    Cardiovascular:     Rate and Rhythm: Normal rate and regular rhythm.     Heart sounds: s1s2  Pulmonary:     Effort: Pulmonary effort is normal.     Breath sounds: Normal breath sounds.   Abdominal:     General: Non distended     Palpations: soft.   Musculoskeletal:        General: Normal range of motion.   Skin:    General: Skin is warm and dry.     Comments: Prior rash in the lowe abdomen, groins and back no longer present  Neurological:     General: grossly non focal     Mental Status: awake, alert and oriented to person, place, and time.   Psychiatric:        Mood and Affect: Mood normal.   Lab Results Lab Results  Component Value Date   WBC 6.6 04/01/2024   HGB 12.0 04/01/2024   HCT 34.2 (L) 04/01/2024   MCV 89.1 04/01/2024   PLT 216 04/01/2024    Lab Results  Component Value Date   CREATININE 1.06 (H) 04/01/2024   BUN 24 (H) 04/01/2024   NA 137 04/01/2024   K 4.3 04/01/2024   CL 104 04/01/2024   CO2 25 04/01/2024    Lab Results  Component Value Date   ALT 63 (H) 04/01/2024   AST 37 04/01/2024   ALKPHOS 117 04/01/2024   BILITOT 0.6 04/01/2024    Lab Results  Component Value Date  CHOL 225 (H) 03/10/2024   HDL 77.80 03/10/2024   LDLCALC 137 (H) 03/10/2024   TRIG 53.0 03/10/2024   CHOLHDL 3 03/10/2024   No results found for: LABRPR, RPRTITER No results found for: HIV1RNAQUANT, HIV1RNAVL, CD4TABS   Microbiology Results for orders placed or performed during the hospital encounter of 03/28/24  Urine Culture     Status: None   Collection Time: 03/28/24  4:21 PM   Specimen: Urine, Clean Catch  Result Value Ref Range Status   Specimen Description URINE, CLEAN CATCH  Final   Special Requests NONE  Final   Culture   Final    NO GROWTH Performed at [facility], [address], [place], [place] [address]    Report Status 03/30/2024 FINAL  Final   Imaging MR LUMBAR SPINE WO CONTRAST Result Date:  03/31/2024 CLINICAL DATA:  83 year old female with Myelopathy. EXAM: MRI LUMBAR SPINE WITHOUT CONTRAST TECHNIQUE: Multiplanar, multisequence MR imaging of the lumbar spine was performed. No intravenous contrast was administered. COMPARISON:  CT Abdomen and Pelvis 06/13/2022. FINDINGS: Segmentation:  Normal on the comparison. Alignment: Stable chronic straightening of lumbar lordosis, mild underlying dextroconvex lumbar scoliosis. No significant spondylolisthesis. Vertebrae: Maintained vertebral body height. Normal background bone marrow signal. Chronic but progressed multilevel degenerative vertebral endplate Schmorl's nodes from T12 through the lumbar spine. Superimposed benign T12 vertebral hemangiomas (normal variant). Intact visible sacrum and SI joints. No marrow edema or evidence of acute osseous abnormality. Conus medullaris and cauda equina: Conus extends to the L1-L2 level. No lower spinal cord or conus signal abnormality. Capacious spinal canal and generally normal cauda equina nerve roots throughout. Paraspinal and other soft tissues: Visible abdominal viscera appear stable from the prior CT Abdomen and Pelvis. Negative visualized posterior paraspinal soft tissues. Disc levels: Lower thoracic spine disc and endplate degeneration T9-T10 and T10-T11 without stenosis. T11-T12 and T12-L1 are normal for age. L1-L2: Small paracentral and caudal disc extrusion with annular fissure (series 2, image 10 and series 5, image 13). No stenosis. L2-L3: Mild disc space loss and circumferential disc bulge. Broad-based posterior component asymmetric to the right. Mild facet, mild to moderate ligament flavum hypertrophy. No spinal or lateral recess stenosis. Mild L2 foraminal stenosis. L3-L4: Disc space loss. Circumferential disc osteophyte complex. Mild to moderate posterior element hypertrophy. No spinal or lateral recess stenosis. Mild bilateral L3 foraminal stenosis. L4-L5: Similar disc space loss, circumferential disc  osteophyte complex. Mild posterior element hypertrophy. No spinal or lateral recess stenosis. Moderate right L4 neural foraminal stenosis. L5-S1: Similar disc space loss, circumferential disc osteophyte complex. Mild facet, moderate ligament flavum hypertrophy. No spinal or lateral recess stenosis. Mild left and moderate right L5 foraminal stenosis. IMPRESSION: 1. No acute osseous abnormality in the lumbar spine. 2. Lower thoracic and lumbar spine degeneration but capacious underlying spinal canal. No subsequent spinal or convincing lateral recess stenosis. 3. Moderate degenerative neural foraminal stenosis at the right L4 and right L5 nerve levels. Electronically Signed   By: VEAR Hurst M.D.   On: 03/31/2024 08:44   US  Abdomen Complete Result Date: 03/31/2024 CLINICAL DATA:  Increased creatinine, fatigue, elevated LFTs EXAM: ABDOMEN ULTRASOUND COMPLETE COMPARISON:  CT abdomen pelvis 06/13/2022 FINDINGS: Gallbladder: No gallstones or wall thickening visualized. No sonographic Murphy sign noted by sonographer. Common bile duct: Diameter: 3 mm. Liver: Benign cyst in the right hepatic lobe. Within normal limits in parenchymal echogenicity. Portal vein is patent on color Doppler imaging with normal direction of blood flow towards the liver. IVC: No  abnormality visualized. Pancreas: Visualized portion unremarkable. Spleen: Size and appearance within normal limits. Right Kidney: Length: 10.1 cm. Echogenicity within normal limits. No mass or hydronephrosis visualized. Left Kidney: Length: 8.6 cm. Echogenicity within normal limits. No mass or hydronephrosis visualized. Abdominal aorta: No aneurysm visualized. Other findings: None. IMPRESSION: Unremarkable abdominal ultrasound. Electronically Signed   By: Norman Gatlin M.D.   On: 03/31/2024 02:04   CT Head Wo Contrast Result Date: 03/30/2024 CLINICAL DATA:  Headache, neuro deficit Weakness. EXAM: CT HEAD WITHOUT CONTRAST TECHNIQUE: Contiguous axial images were obtained  from the base of the skull through the vertex without intravenous contrast. RADIATION DOSE REDUCTION: This exam was performed according to the departmental dose-optimization program which includes automated exposure control, adjustment of the mA and/or kV according to patient size and/or use of iterative reconstruction technique. COMPARISON:  None Available. FINDINGS: Brain: No intracranial hemorrhage, mass effect, or midline shift. Normal brain volume for age. No hydrocephalus. The basilar cisterns are patent. Minor chronic small vessel ischemia. No evidence of territorial infarct or acute ischemia. No extra-axial or intracranial fluid collection. Vascular: Atherosclerosis of skullbase vasculature without hyperdense vessel or abnormal calcification. Skull: No fracture or focal lesion. Sinuses/Orbits: Subtotal opacification of right mastoid air cells. Paranasal sinuses are clear. Other: None. IMPRESSION: 1. No acute intracranial abnormality. 2. Minor chronic small vessel ischemia. 3. Subtotal opacification of right mastoid air cells. Electronically Signed   By: Andrea Gasman M.D.   On: 03/30/2024 16:46   DG Chest 2 View Result Date: 03/30/2024 CLINICAL DATA:  weakness EXAM: CHEST - 2 VIEW COMPARISON:  02/06/2017 FINDINGS: Lungs are clear. Heart size and mediastinal contours are within normal limits. Aortic Atherosclerosis (ICD10-170.0). No effusion. Visualized bones unremarkable. IMPRESSION: No acute cardiopulmonary disease. Electronically Signed   By: JONETTA Faes M.D.   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 No evidence meningitis/encephalitis, carditis, arthritis. Patient reports continued numbness in the rt thigh however MRI L spine shows stenosis at right L4,L5 nerve levels and could be related  - Patient/daughter asking if need to check for another tick related coinfection. Do not think necessary  especially given patient is improving on doxycycline , improving liver enzymes,  one episode of leukopenia during admission that resolved.  - Daughter says one of their family friend who is an ER physician said about possible need for prolonged course of doxycycline  like 28 days course. Discussed that 21 days is enough for her condition. Fatigue wil gradually resolve and can takes weeks to months to resolve - Discussed to let us  know how she is doing at the end of tx if unable to come for fu.   # Rt L4-L5 foraminal stenosis/HTN - fu with PCP for management  I 75 minutes involved in face-to-face and non-face-to-face activities for this patient on the day of the visit. Professional time spent includes the following activities: Preparing to see the patient (review of tests), Obtaining and reviewing separately obtained history (discharge record 7/1, PCP notes 7/3), Performing a medically appropriate examination and evaluation,  Documenting clinical information in the EMR, Independently interpreting results (not separately reported), Communicating results to the patient/family member, Counseling and educating the patient/family member.   Of note, portions of this note may have been created with voice recognition software. While this note has been edited for accuracy, occasional wrong-word or 'sound-a-like' substitutions may have occurred due to the inherent limitations of voice recognition software.  Annalee Joseph, MD [facility] 04/17/2024, 2:35 PM

## 2024-04-17 NOTE — Telephone Encounter (Signed)
 FYI Only or Action Required?: Action required by provider: clinical question for provider and update on patient condition.  Patient was last seen in primary care on 04/03/2024 by Jodie Lavern CROME, MD.  Called Nurse Triage reporting Hypertension.  Interventions attempted: Nothing.  Symptoms are: unchanged.  Triage Disposition: See PCP When Office is Open (Within 3 Days)  Patient/caregiver understands and will follow disposition?: No, wishes to speak with PCP  Pt would like to know if she should start taking her BP meds again. States she was told to stop taking them when she was diagnosed with Lyme. Pt states that she has no s/s. Pt states today at infectious disease it was elevated. Pt states it was also elevated on 7/14. Please call pt back and advise, pt states that she does not want an appt, just PCP to advise her.  7/14 186/72 Today 174/76, 160/78 Copied from CRM #[id number]. Topic: Clinical - Red Word Triage >> Apr 17, 2024  4:07 PM Mia F wrote: Red Word that prompted transfer to Nurse Triage: Pt says after her appt today with infectious disease (Dr Annalee Joseph) for lyme disease that she need to return to her blood pressure medication (telmisartan -hydrochlorothiazide  (MICARDIS  HCT) 80-12.5 MG tablet). She says she think she should be back on the medication because on Monday 186/72 with a pulse of 68. Today at the dr office her bp was 174/76 with a pulse of 73. Before she left the office the nurse checked her again and it was 160/78 but did not get that pulse. Pt thinks that her bp meds were stopped since she's taking the lyme disease medication. Pt would like a call to know what to do. She has some meds at home and can take it but do not want to take the medication without the dr consent. Pt would like a call or a message via My Chart. Pt mentions she could tell her bp has been high because she has been very weak. Due to pt mentioning the high blood pressure, weakness, and also fatigue, I am  going to transfer to NT Reason for Disposition  Systolic BP >= 160 OR Diastolic >= 100  Answer Assessment - Initial Assessment Questions 3. HOW: How did you take your blood pressure? (e.g., automatic home BP monitor, visiting nurse)     Clinic today 4. HISTORY: Do you have a history of high blood pressure?     Yes but was taken off BP meds when diagnosed with Lyme  5. MEDICINES: Are you taking any medicines for blood pressure? Have you missed any doses recently?     Does not take med currently 6. OTHER SYMPTOMS: Do you have any symptoms? (e.g., blurred vision, chest pain, difficulty breathing, headache, weakness)     denies  Protocols used: Blood Pressure - High-A-AH

## 2024-04-18 NOTE — Telephone Encounter (Signed)
 Please see message below, needs a call back ASAP. Please advise.

## 2024-04-20 DIAGNOSIS — A6920 Lyme disease, unspecified: Secondary | ICD-10-CM | POA: Insufficient documentation

## 2024-04-20 DIAGNOSIS — Z79899 Other long term (current) drug therapy: Secondary | ICD-10-CM | POA: Insufficient documentation

## 2024-04-20 DIAGNOSIS — R5383 Other fatigue: Secondary | ICD-10-CM | POA: Insufficient documentation

## 2024-05-06 ENCOUNTER — Encounter: Payer: Self-pay | Admitting: Family Medicine

## 2024-05-06 ENCOUNTER — Ambulatory Visit: Admitting: Family Medicine

## 2024-05-06 VITALS — BP 152/67 | HR 69 | Temp 97.7°F | Ht 65.0 in | Wt 120.8 lb

## 2024-05-06 DIAGNOSIS — I10 Essential (primary) hypertension: Secondary | ICD-10-CM | POA: Diagnosis not present

## 2024-05-06 DIAGNOSIS — A6920 Lyme disease, unspecified: Secondary | ICD-10-CM

## 2024-05-06 NOTE — Patient Instructions (Signed)
 Please return in 3 months to recheck blood pressure; please cancel the follow up appointment in September.  If you have any questions or concerns, please don't hesitate to send me a message via MyChart or call the office at [phone]. Thank you for visiting with us  today! It's our pleasure caring for you.

## 2024-05-06 NOTE — Progress Notes (Signed)
 Subjective  CC:  Chief Complaint  Patient presents with   Positive Lyme disease    HPI: Gloria Cummings is a 83 y.o. female who presents to the office today to address the problems listed above in the chief complaint. Lymes disease: see last note. I reviewed recent consult with ID> on doxy for lymes.  Fortunately, now feeling better, brain fog, fatigue and arthalgias have resolved. Right thigh numbness persists but feeling much more like herself again. No complications.  Labs reviewed HTN: bp has trended back upward since stopping meds. Restarted valsartan hct several days ago  Assessment  1. Lyme disease   2. Labile essential hypertension      Plan  lymes:  improving. Complete doxy and monitor. Self care discussed. Htn: restart meds and monitor. Recheck 87mo. Improving slowly.  Follow up: 3 mo 08/05/2024  No orders of the defined types were placed in this encounter.  No orders of the defined types were placed in this encounter.     I reviewed the patients updated PMH, FH, and SocHx.    Patient Active Problem List   Diagnosis Date Noted   Chronic kidney disease, stage 3a (HCC) 08/16/2022    Priority: High   History of breast cancer 03/12/2017    Priority: High   History of endometrial cancer 02/02/2017    Priority: High   Labile essential hypertension 01/20/2011    Priority: High   Family history of breast cancer     Priority: Medium    Family history of prostate cancer     Priority: Medium    Family history of colon cancer     Priority: Medium    Colon polyp 01/14/2013    Priority: Medium    Recurrent UTI 01/14/2013    Priority: Medium    Osteoarthritis, multiple sites 10/25/2010    Priority: Medium    Osteoporosis 10/25/2010    Priority: Medium    Genetic testing 04/12/2017    Priority: Low   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    Priority: Low   Lyme disease 04/20/2024   Medication management 04/20/2024   Other fatigue 04/20/2024    Acute renal failure superimposed on stage 3b chronic kidney disease (HCC) 03/30/2024   Right leg numbness 03/30/2024   Elevated transaminase level 03/30/2024   Hypokalemia 03/30/2024   Intertrigo 03/30/2024   Bilateral hearing loss 03/10/2024   Orthostatic hypotension 03/10/2024   Concern about memory 03/10/2024   Current Meds  Medication Sig   acetaminophen  (TYLENOL ) 325 MG tablet Take 2 tablets (650 mg total) by mouth every 6 (six) hours as needed for mild pain (pain score 1-3) or fever (or Fever >/= 101).   aspirin  EC 81 MG tablet Take 81 mg by mouth daily.   calcium carbonate (TUMS EX) 750 MG chewable tablet Chew 750 mg by mouth daily.   cetirizine  (ZYRTEC  ALLERGY) 10 MG tablet Take 1 tablet (10 mg total) by mouth daily.   Cholecalciferol (VITAMIN D3) 50 MCG (2000 UT) capsule Take 2,000 Units by mouth daily.   clotrimazole  (LOTRIMIN ) 1 % cream Apply topically 2 (two) times daily.   famotidine  (PEPCID ) 20 MG tablet Take 1 tablet (20 mg total) by mouth daily.   magnesium oxide (MAG-OX) 400 (240 Mg) MG tablet Take 400 mg by mouth daily.   Multiple Vitamins-Minerals (ONE A DAY WOMEN 50 PLUS) TABS Take 1 tablet by mouth daily.   Omega-3 Fatty Acids (FISH OIL PO) Take 4 capsules by mouth daily.  Red Yeast Rice Extract (RED YEAST RICE PO) Take 1 capsule by mouth daily.   telmisartan -hydrochlorothiazide  (MICARDIS  HCT) 80-12.5 MG tablet Take 1 tablet by mouth daily.   [DISCONTINUED] predniSONE  (STERAPRED UNI-PAK 21 TAB) 10 MG (21) TBPK tablet Take 6 tablets daily for the first 2 days and then 5 tablets daily for the next 2 days, 4 tablets daily for the next following 2 days, then take 3 tablets daily for 2 days, then take 2 tablets daily for 2 days and then take 1 tablet daily for 2 days    Allergies: Patient is allergic to betadine [povidone iodine], lubricants, and zestril [lisinopril]. Family History: Patient family history includes Breast cancer in her cousin and paternal aunt; Cancer in  her paternal aunt and paternal grandfather; Heart disease in her brother, father, maternal grandfather, and maternal grandmother; Prostate cancer in her paternal uncle; Stomach cancer in her paternal uncle; Stroke in her father and mother. Social History:  Patient  reports that she has never smoked. She has never used smokeless tobacco. She reports that she does not drink alcohol and does not use drugs.  Review of Systems: Constitutional: Negative for fever malaise or anorexia Cardiovascular: negative for chest pain Respiratory: negative for SOB or persistent cough Gastrointestinal: negative for abdominal pain  Objective  Vitals: BP (!) 152/67   Pulse 69   Temp 97.7 F (36.5 C)   Ht 5' 5 (1.651 m)   Wt 120 lb 12.8 oz (54.8 kg)   SpO2 98%   BMI 20.10 kg/m  General: no acute distress , A&Ox3, appears well again Cognition is back to baseline HEENT: PEERL, conjunctiva normal, neck is supple Cardiovascular:  RRR without murmur or gallop.  Respiratory:  Good breath sounds bilaterally, CTAB with normal respiratory effort Skin:  Warm, no rashes  Commons side effects, risks, benefits, and alternatives for medications and treatment plan prescribed today were discussed, and the patient expressed understanding of the given instructions. Patient is instructed to call or message via MyChart if he/she has any questions or concerns regarding our treatment plan. No barriers to understanding were identified. We discussed Red Flag symptoms and signs in detail. Patient expressed understanding regarding what to do in case of urgent or emergency type symptoms.  Medication list was reconciled, printed and provided to the patient in AVS. Patient instructions and summary information was reviewed with the patient as documented in the AVS. This note was prepared with assistance of Dragon voice recognition software. Occasional wrong-word or sound-a-like substitutions may have occurred due to the inherent  limitations of voice recognition software

## 2024-06-11 ENCOUNTER — Ambulatory Visit: Admitting: Family Medicine

## 2024-08-05 ENCOUNTER — Ambulatory Visit: Admitting: Family Medicine

## 2024-08-05 ENCOUNTER — Encounter: Payer: Self-pay | Admitting: Family Medicine

## 2024-08-05 VITALS — BP 156/72 | HR 67 | Temp 97.7°F | Ht 65.0 in | Wt 120.8 lb

## 2024-08-05 DIAGNOSIS — Z23 Encounter for immunization: Secondary | ICD-10-CM | POA: Diagnosis not present

## 2024-08-05 DIAGNOSIS — R140 Abdominal distension (gaseous): Secondary | ICD-10-CM

## 2024-08-05 DIAGNOSIS — I10 Essential (primary) hypertension: Secondary | ICD-10-CM

## 2024-08-05 DIAGNOSIS — A6920 Lyme disease, unspecified: Secondary | ICD-10-CM | POA: Diagnosis not present

## 2024-08-05 DIAGNOSIS — L650 Telogen effluvium: Secondary | ICD-10-CM | POA: Diagnosis not present

## 2024-08-05 MED ORDER — TELMISARTAN 80 MG PO TABS: 160.0000 mg | ORAL_TABLET | Freq: Every day | ORAL | 1 refills | Status: AC

## 2024-08-05 NOTE — Progress Notes (Signed)
 Subjective  CC:  Chief Complaint  Patient presents with   Hypertension   Lyme Disease    HPI: Gloria Cummings is a 83 y.o. female who presents to the office today to address the problems listed above in the chief complaint. Hypertension f/u:  Discussed the use of AI scribe software for clinical note transcription with the patient, who gave verbal consent to proceed.  History of Present Illness Gloria Cummings is an 83 year old female who presents with hair loss and digestive issues.  Telogen effluvium - Significant hair loss described as 'falling out by the handful' - No prior history of hair loss before Lyme disease - Washes hair once a week - Finds the hair loss alarming and fears becoming bald  Gastrointestinal symptoms - Ongoing digestive issues since hospital discharge on July 1st (lymes, completed 3 weeks of doxy) - No bowel movement resembling feces until late October - Severe bloating, described as feeling 'pregnant' and having a belly - Frequent urge to defecate, often resulting in 'blast of gas' followed by 'splatters' - Use of probiotics, Miralax, and Activia with some improvement but persistent symptoms - Dietary intervention with sauerkraut  Hypertension - History of high blood pressure - Currently taking antihypertensive medication daily, telmisartan  hydrochlorothiazide  80/12.5 - she is hesitant to change medications. - prefers to NOT treat HTN - Feels well on current dose - Remains very active, including dancing for three hours without stopping  Cognitive changes - Difficulty remembering names and words - Requires use of calendar to keep track of days and activities - Questions possible association with Lyme disease  Lymes disease: energy has finally returned. Still with issues above.    Assessment  1. Labile essential hypertension   2. Lyme disease   3. Telogen effluvium   4. Bloating   5. Need for influenza vaccination      Plan   Assessment and Plan Assessment & Plan Hypertension Blood pressure is elevated at 172 mmHg. She is on antihypertensive medication but reports feeling well on 12.5 mg. Discussed risks of untreated hypertension, including stroke and heart attack, and benefits of medication in preventing these complications. She is hesitant to increase medication dosage despite previous advice from another physician. - will change to telmisartan  160 daily and monitor.   Chronic gastrointestinal dysfunction post-antibiotics Persistent gastrointestinal symptoms post-antibiotic treatment for Lyme disease, including bloating, gas, and irregular bowel movements. Symptoms have improved slightly with probiotics and dietary changes. Discussed potential use of BPC-157 peptide for gut health, which may help with leaky gut and systemic symptoms. She is interested in trying this non-pharmaceutical option. - Continue probiotics and dietary modifications. - Consider BPC-157 peptide for gut health,  - she will see her GI for further evaluation as well  Telogen Effluvium Hair loss likely related to stress and recovery from Lyme disease. Discussed natural hair growth cycle and potential for improvement over time. Recommended over-the-counter Rogaine (minoxidil) for topical use to aid hair regrowth. - Use over-the-counter Rogaine (minoxidil) topically on the scalp nightly for six months.   Flu shot given today  Education regarding management of these chronic disease states was given. Management strategies discussed on successive visits include dietary and exercise recommendations, goals of achieving and maintaining IBW, and lifestyle modifications aiming for adequate sleep and minimizing stressors.  Follow up: June 2026 for htn and cpe  Orders Placed This Encounter  Procedures   Flu vaccine HIGH DOSE PF(Fluzone Trivalent)   Meds ordered this encounter  Medications  telmisartan  (MICARDIS ) 80 MG tablet    Sig: Take 2 tablets  (160 mg total) by mouth daily.    Dispense:  180 tablet    Refill:  1    Discontinuing the telmisartan  hctz      BP Readings from Last 3 Encounters:  08/05/24 (!) 156/72  05/06/24 (!) 152/67  04/17/24 (!) 174/76   Wt Readings from Last 3 Encounters:  08/05/24 120 lb 12.8 oz (54.8 kg)  05/06/24 120 lb 12.8 oz (54.8 kg)  04/17/24 121 lb (54.9 kg)    Lab Results  Component Value Date   CHOL 225 (H) 03/10/2024   CHOL 230 (H) 04/30/2023   Lab Results  Component Value Date   HDL 77.80 03/10/2024   HDL 80.80 04/30/2023   Lab Results  Component Value Date   LDLCALC 137 (H) 03/10/2024   LDLCALC 132 (H) 04/30/2023   Lab Results  Component Value Date   TRIG 53.0 03/10/2024   TRIG 86.0 04/30/2023   Lab Results  Component Value Date   CHOLHDL 3 03/10/2024   CHOLHDL 3 04/30/2023   No results found for: LDLDIRECT Lab Results  Component Value Date   CREATININE 1.06 (H) 04/01/2024   BUN 24 (H) 04/01/2024   NA 137 04/01/2024   K 4.3 04/01/2024   CL 104 04/01/2024   CO2 25 04/01/2024    The ASCVD Risk score (Arnett DK, et al., 2019) failed to calculate for the following reasons:   The 2019 ASCVD risk score is only valid for ages 57 to 61  I reviewed the patients updated PMH, FH, and SocHx.    Patient Active Problem List   Diagnosis Date Noted   Chronic kidney disease, stage 3a (HCC) 08/16/2022    Priority: High   History of breast cancer 03/12/2017    Priority: High   History of endometrial cancer 02/02/2017    Priority: High   Labile essential hypertension 01/20/2011    Priority: High   Family history of breast cancer     Priority: Medium    Family history of prostate cancer     Priority: Medium    Family history of colon cancer     Priority: Medium    Colon polyp 01/14/2013    Priority: Medium    Recurrent UTI 01/14/2013    Priority: Medium    Osteoarthritis, multiple sites 10/25/2010    Priority: Medium    Osteoporosis 10/25/2010    Priority: Medium     Genetic testing 04/12/2017    Priority: Low   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    Priority: Low   Lyme disease 04/20/2024   Medication management 04/20/2024   Other fatigue 04/20/2024   Acute renal failure superimposed on stage 3b chronic kidney disease (HCC) 03/30/2024   Right leg numbness 03/30/2024   Elevated transaminase level 03/30/2024   Hypokalemia 03/30/2024   Intertrigo 03/30/2024   Bilateral hearing loss 03/10/2024   Orthostatic hypotension 03/10/2024   Concern about memory 03/10/2024    Allergies: Betadine [povidone iodine], Lubricants, and Zestril [lisinopril]  Social History: Patient  reports that she has never smoked. She has never used smokeless tobacco. She reports that she does not drink alcohol and does not use drugs.  Current Meds  Medication Sig   aspirin  EC 81 MG tablet Take 81 mg by mouth daily.   calcium carbonate (TUMS EX) 750 MG chewable tablet Chew 750 mg by mouth daily.   Cholecalciferol (VITAMIN D3) 50 MCG (2000 UT) capsule Take  2,000 Units by mouth daily.   clotrimazole  (LOTRIMIN ) 1 % cream Apply topically 2 (two) times daily.   Multiple Vitamins-Minerals (ONE A DAY WOMEN 50 PLUS) TABS Take 1 tablet by mouth daily.   Omega-3 Fatty Acids (FISH OIL PO) Take 4 capsules by mouth daily.   Red Yeast Rice Extract (RED YEAST RICE PO) Take 1 capsule by mouth daily.   telmisartan  (MICARDIS ) 80 MG tablet Take 2 tablets (160 mg total) by mouth daily.   [DISCONTINUED] telmisartan -hydrochlorothiazide  (MICARDIS  HCT) 80-12.5 MG tablet Take 1 tablet by mouth daily.    Review of Systems: Cardiovascular: negative for chest pain, palpitations, leg swelling, orthopnea Respiratory: negative for SOB, wheezing or persistent cough Gastrointestinal: negative for abdominal pain Genitourinary: negative for dysuria or gross hematuria  Objective  Vitals: BP (!) 156/72   Pulse 67   Temp 97.7 F (36.5 C)   Ht 5' 5 (1.651 m)   Wt 120 lb 12.8 oz (54.8  kg)   SpO2 97%   BMI 20.10 kg/m  General: no acute distress  Psych:  Alert and oriented, normal mood and affect HEENT:  Normocephalic, atraumatic, supple neck  Cardiovascular:  RRR without murmur. no edema Respiratory:  Good breath sounds bilaterally, CTAB with normal respiratory effort Abdomen is soft, NT and w/o mass Skin:  Warm, no rashes Neurologic:   Mental status is normal Commons side effects, risks, benefits, and alternatives for medications and treatment plan prescribed today were discussed, and the patient expressed understanding of the given instructions. Patient is instructed to call or message via MyChart if he/she has any questions or concerns regarding our treatment plan. No barriers to understanding were identified. We discussed Red Flag symptoms and signs in detail. Patient expressed understanding regarding what to do in case of urgent or emergency type symptoms.  Medication list was reconciled, printed and provided to the patient in AVS. Patient instructions and summary information was reviewed with the patient as documented in the AVS. This note was prepared with assistance of Dragon voice recognition software. Occasional wrong-word or sound-a-like substitutions may have occurred due to the inherent limitation

## 2024-08-05 NOTE — Patient Instructions (Signed)
 Please return in June for your annual complete physical; please come fasting. For follow up on chronic medical conditions   If you have any questions or concerns, please don't hesitate to send me a message via MyChart or call the office at [phone]. Thank you for visiting with us  today! It's our pleasure caring for you.

## 2024-09-08 ENCOUNTER — Ambulatory Visit: Payer: Medicare PPO

## 2024-09-08 VITALS — Ht 65.0 in | Wt 120.0 lb

## 2024-09-08 DIAGNOSIS — Z0000 Encounter for general adult medical examination without abnormal findings: Secondary | ICD-10-CM

## 2024-09-08 NOTE — Patient Instructions (Signed)
 [name],  Thank you for taking the time for your Medicare Wellness Visit. I appreciate your continued commitment to your health goals. Please review the care plan we discussed, and feel free to reach out if I can assist you further.  Please note that Annual Wellness Visits do not include a physical exam. Some assessments may be limited, especially if the visit was conducted virtually. If needed, we may recommend an in-person follow-up with your provider.  Ongoing Care Seeing your primary care provider every 3 to 6 months helps us  monitor your health and provide consistent, personalized care.   Referrals If a referral was made during today's visit and you haven't received any updates within two weeks, please contact the referred provider directly to check on the status.  Recommended Screenings:  Health Maintenance  Topic Date Due   COVID-19 Vaccine (2 - Janssen risk series) 02/17/2021   Breast Cancer Screening  02/26/2025   Medicare Annual Wellness Visit  09/08/2025   DTaP/Tdap/Td vaccine (3 - Td or Tdap) 01/30/2026   Pneumococcal Vaccine for age over 73  Completed   Flu Shot  Completed   Zoster (Shingles) Vaccine  Completed   Meningitis B Vaccine  Aged Out   Osteoporosis screening with Bone Density Scan  Discontinued       09/08/2024   10:45 AM  Advanced Directives  Does Patient Have a Medical Advance Directive? Yes  Type of Advance Directive Healthcare Power of Attorney  Copy of Healthcare Power of Attorney in Chart? Yes - validated most recent copy scanned in chart (See row information)    Vision: Annual vision screenings are recommended for early detection of glaucoma, cataracts, and diabetic retinopathy. These exams can also reveal signs of chronic conditions such as diabetes and high blood pressure.  Dental: Annual dental screenings help detect early signs of oral cancer, gum disease, and other conditions linked to overall health, including heart disease and  diabetes.  Please see the attached documents for additional preventive care recommendations.

## 2024-09-08 NOTE — Addendum Note (Signed)
 Addended by: JAYLENE ELLOUISE DEL on: 09/08/2024 11:39 AM   Modules accepted: Orders

## 2024-09-08 NOTE — Progress Notes (Addendum)
 Chief Complaint  Patient presents with   Medicare Wellness     Subjective:   Gloria Cummings is a 83 y.o. female who presents for a [employer] Visit.  Visit info / Clinical Intake: Medicare Wellness Visit Type:: Subsequent Annual Wellness Visit Persons participating in visit and providing information:: patient Medicare Wellness Visit Mode:: Telephone If telephone:: video declined Since this visit was completed virtually, some vitals may be partially provided or unavailable. Missing vitals are due to the limitations of the virtual format.: Unable to obtain vitals - no equipment If Telephone or Video please confirm:: I connected with patient using audio/video enable telemedicine. I verified patient identity with two identifiers, discussed telehealth limitations, and patient agreed to proceed. Patient Location:: home Provider Location:: office Interpreter Needed?: No Pre-visit prep was completed: yes AWV questionnaire completed by patient prior to visit?: no Living arrangements:: (!) lives alone (with cat) Patient's Overall Health Status Rating: excellent Typical amount of pain: none Does pain affect daily life?: no Are you currently prescribed opioids?: no  Dietary Habits and Nutritional Risks How many meals a day?: 3 Eats fruit and vegetables daily?: yes Most meals are obtained by: preparing own meals; eating out In the last 2 weeks, have you had any of the following?: none Diabetic:: no  Functional Status Activities of Daily Living (to include ambulation/medication): Independent Ambulation: Independent with device- listed below Home Assistive Devices/Equipment: Eyeglasses (implants) Medication Administration: Independent Home Management (perform basic housework or laundry): Independent Manage your own finances?: yes Primary transportation is: driving Concerns about vision?: no *vision screening is required for WTM* Concerns about hearing?: no  Fall  Screening Falls in the past year?: 0 Number of falls in past year: 0 Was there an injury with Fall?: 0 Fall Risk Category Calculator: 0 Patient Fall Risk Level: Low Fall Risk  Fall Risk Patient at Risk for Falls Due to: No Fall Risks Fall risk Follow up: Falls evaluation completed  Home and Transportation Safety: All rugs have non-skid backing?: yes All stairs or steps have railings?: yes Grab bars in the bathtub or shower?: (!) no Have non-skid surface in bathtub or shower?: (!) no Good home lighting?: yes Regular seat belt use?: yes Hospital stays in the last year:: (!) yes How many hospital stays:: 1 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 : yes (STM) Will 6CIT or Mini Cog be Completed: yes What year is it?: 0 points What month is it?: 0 points Give patient an address phrase to remember (5 components): [address] [place]  About what time is it?: 0 points Count backwards from 20 to 1: 0 points Say the months of the year in reverse: 0 points Repeat the address phrase from earlier: 0 points 6 CIT Score: 0 points  Advance Directives (For Healthcare) Does Patient Have a Medical Advance Directive?: Yes Type of Advance Directive: Healthcare Power of Attorney Copy of Healthcare Power of Attorney in Chart?: Yes - validated most recent copy scanned in chart (See row information) Would patient like information on creating a medical advance directive?: No - Patient declined  Reviewed/Updated  Reviewed/Updated: Reviewed All (Medical, Surgical, Family, Medications, Allergies, Care Teams, Patient Goals)    Allergies (verified) Betadine [povidone iodine], Lubricants, and Zestril [lisinopril]   Current Medications (verified) Outpatient Encounter Medications as of 09/08/2024  Medication Sig   aspirin  EC 81 MG tablet Take 81 mg by mouth daily.   calcium carbonate (TUMS EX) 750 MG chewable tablet  Chew 750 mg  by mouth daily.   Cholecalciferol (VITAMIN D3) 50 MCG (2000 UT) capsule Take 2,000 Units by mouth daily.   Multiple Vitamins-Minerals (ONE A DAY WOMEN 50 PLUS) TABS Take 1 tablet by mouth daily.   Omega-3 Fatty Acids (FISH OIL PO) Take 4 capsules by mouth daily.   Probiotic CHEW 1 tablet Orally daily   Red Yeast Rice Extract (RED YEAST RICE PO) Take 1 capsule by mouth daily.   telmisartan  (MICARDIS ) 80 MG tablet Take 2 tablets (160 mg total) by mouth daily.   [DISCONTINUED] acetaminophen  (TYLENOL ) 325 MG tablet Take 2 tablets (650 mg total) by mouth every 6 (six) hours as needed for mild pain (pain score 1-3) or fever (or Fever >/= 101). (Patient not taking: Reported on 08/05/2024)   [DISCONTINUED] cetirizine  (ZYRTEC  ALLERGY) 10 MG tablet Take 1 tablet (10 mg total) by mouth daily. (Patient not taking: Reported on 08/05/2024)   [DISCONTINUED] clotrimazole  (LOTRIMIN ) 1 % cream Apply topically 2 (two) times daily.   [DISCONTINUED] famotidine  (PEPCID ) 20 MG tablet Take 1 tablet (20 mg total) by mouth daily. (Patient not taking: Reported on 08/05/2024)   [DISCONTINUED] magnesium oxide (MAG-OX) 400 (240 Mg) MG tablet Take 400 mg by mouth daily. (Patient not taking: Reported on 08/05/2024)   No facility-administered encounter medications on file as of 09/08/2024.    History: Past Medical History:  Diagnosis Date   Anemia    Arthritis    Cancer (HCC)    endo metrial   left breast   Cataract    Dyspnea    hx of   Family history of adverse reaction to anesthesia    Mother died following surgery stroke   Family history of breast cancer    Family history of colon cancer    Family history of prostate cancer    Family history of stomach cancer    Heart murmur    History of colon polyps    Hypertension    Occipital neuralgia    Osteoporosis    Pneumonia    Past Surgical History:  Procedure Laterality Date   BREAST LUMPECTOMY     left lumpectomy   COLON SURGERY     procedures   EYE SURGERY      implants on both eyes   FOOT SURGERY     FRACTURE SURGERY     ROBOTIC ASSISTED LAP VAGINAL HYSTERECTOMY N/A 02/08/2017   Procedure: XI ROBOTIC ASSISTED LAPAROSCOPIC TOTAL HYSTERECTOMY, BILATERAL SALPINGOOPHORECTOMY;  Surgeon: Eloy Herring, MD;  Location: [facility];  Service: Gynecology;  Laterality: N/A;   SENTINEL NODE BIOPSY N/A 02/08/2017   Procedure: SENTINEL NODE BIOPSY;  Surgeon: Eloy Herring, MD;  Location: [facility];  Service: Gynecology;  Laterality: N/A;   TUBAL LIGATION     WISDOM TOOTH EXTRACTION     Family History  Problem Relation Age of Onset   Stroke Mother    Heart disease Father    Stroke Father    Heart disease Brother    Heart disease Maternal Grandmother    Heart disease Maternal Grandfather    Cancer Paternal Grandfather        NOS   Breast cancer Paternal Aunt        dx over 33   Stomach cancer Paternal Uncle    Prostate cancer Paternal Uncle    Cancer Paternal Aunt        possible colon cancer   Breast cancer Cousin        paternal first cousin dx over  50   Social History   Occupational History   Not on file  Tobacco Use   Smoking status: Never   Smokeless tobacco: Never  Vaping Use   Vaping status: Never Used  Substance and Sexual Activity   Alcohol use: No   Drug use: No   Sexual activity: Not Currently   Tobacco Counseling Counseling given: Not Answered  SDOH Screenings   Food Insecurity: No Food Insecurity (09/08/2024)  Housing: Low Risk  (09/08/2024)  Transportation Needs: No Transportation Needs (09/08/2024)  Utilities: Not At Risk (09/08/2024)  Alcohol Screen: Low Risk  (09/08/2024)  Depression (PHQ2-9): Low Risk  (09/08/2024)  Financial Resource Strain: Low Risk  (09/03/2023)  Physical Activity: Sufficiently Active (09/08/2024)  Social Connections: Moderately Isolated (09/08/2024)  Stress: No Stress Concern Present (09/08/2024)  Tobacco Use: Low Risk  (09/08/2024)  Health Literacy: Adequate Health Literacy (09/08/2024)   See flowsheets for  full screening details  Depression Screen PHQ 2 & 9 Depression Scale- Over the past 2 weeks, how often have you been bothered by any of the following problems? Little interest or pleasure in doing things: 0 Feeling down, depressed, or hopeless (PHQ Adolescent also includes...irritable): 0 PHQ-2 Total Score: 0     Goals Addressed             This Visit's Progress    Patient Stated   On track    Maintain health and eating well              Objective:    Today's Vitals   09/08/24 1038  Weight: 120 lb (54.4 kg)  Height: 5' 5 (1.651 m)   Body mass index is 19.97 kg/m.  Hearing/Vision screen Hearing Screening - Comments:: Pt denies any hearing issues  Vision Screening - Comments:: Wears rx glasses - up to date with routine eye exams with Dr Cleatus  Immunizations and Health Maintenance Health Maintenance  Topic Date Due   COVID-19 Vaccine (2 - Janssen risk series) 02/17/2021   Mammogram  02/26/2025   Medicare Annual Wellness (AWV)  09/08/2025   DTaP/Tdap/Td (3 - Td or Tdap) 01/30/2026   Pneumococcal Vaccine: 50+ Years  Completed   Influenza Vaccine  Completed   Zoster Vaccines- Shingrix   Completed   Meningococcal B Vaccine  Aged Out   Bone Density Scan  Discontinued        Assessment/Plan:  This is a routine wellness examination for [facility].  Patient Care Team: Jodie Lavern CROME, MD as PCP - General (Family Medicine) Marne Kelly Nest, MD as Consulting Physician (Obstetrics and Gynecology) Eloy Herring, MD as Consulting Physician (Gynecologic Oncology) Renelle Ubaldo ORN, DC as Referring Physician (Chiropractic Medicine) Cleatus Collar, MD as Consulting Physician (Ophthalmology) Melba Pool, DDS as Referring Physician (Dentistry) Cary Doffing, MD as Consulting Physician (Dermatology) Annalee Joseph as Consulting Physician (Infectious Diseases)  I have personally reviewed and noted the following in the patient's chart:   Medical and social history Use of  alcohol, tobacco or illicit drugs  Current medications and supplements including opioid prescriptions. Functional ability and status Nutritional status Physical activity Advanced directives List of other physicians Hospitalizations, surgeries, and ER visits in previous 12 months Vitals Screenings to include cognitive, depression, and falls Referrals and appointments  No orders of the defined types were placed in this encounter.  In addition, I have reviewed and discussed with patient certain preventive protocols, quality metrics, and best practice recommendations. A written personalized care plan for preventive services as well as general preventive health recommendations were  provided to patient.   Ellouise VEAR Haws, LPN   [id number]   Return in 1 year (on 09/08/2025).  After Visit Summary: (MyChart) Due to this being a telephonic visit, the after visit summary with patients personalized plan was offered to patient via MyChart   Nurse Notes: none

## 2024-09-10 ENCOUNTER — Ambulatory Visit
Admission: RE | Admit: 2024-09-10 | Discharge: 2024-09-10 | Disposition: A | Discharge status: Home / Self Care | Source: Ambulatory Visit | Attending: Gastroenterology | Admitting: Gastroenterology

## 2024-09-10 ENCOUNTER — Other Ambulatory Visit: Payer: Self-pay | Admitting: Gastroenterology

## 2024-09-10 DIAGNOSIS — R634 Abnormal weight loss: Secondary | ICD-10-CM

## 2024-09-10 DIAGNOSIS — R197 Diarrhea, unspecified: Secondary | ICD-10-CM

## 2025-03-17 ENCOUNTER — Encounter: Admitting: Family Medicine

## 2025-09-09 ENCOUNTER — Ambulatory Visit
